# Patient Record
Sex: Female | Born: 1970 | Race: White | Smoking: Former smoker
Health system: Southern US, Community
[De-identification: ages and names within clinical notes are randomized; demographics above are authoritative.]

## PROBLEM LIST (undated history)

## (undated) DIAGNOSIS — Z889 Allergy status to unspecified drugs, medicaments and biological substances status: Secondary | ICD-10-CM

## (undated) DIAGNOSIS — F1211 Cannabis abuse, in remission: Secondary | ICD-10-CM

## (undated) DIAGNOSIS — N200 Calculus of kidney: Secondary | ICD-10-CM

## (undated) DIAGNOSIS — R9431 Abnormal electrocardiogram [ECG] [EKG]: Secondary | ICD-10-CM

## (undated) DIAGNOSIS — M79641 Pain in right hand: Secondary | ICD-10-CM

## (undated) DIAGNOSIS — R001 Bradycardia, unspecified: Secondary | ICD-10-CM

## (undated) DIAGNOSIS — Z8659 Personal history of other mental and behavioral disorders: Secondary | ICD-10-CM

## (undated) DIAGNOSIS — R0609 Other forms of dyspnea: Secondary | ICD-10-CM

## (undated) DIAGNOSIS — K580 Irritable bowel syndrome with diarrhea: Secondary | ICD-10-CM

## (undated) DIAGNOSIS — J439 Emphysema, unspecified: Secondary | ICD-10-CM

## (undated) DIAGNOSIS — F32A Depression, unspecified: Secondary | ICD-10-CM

## (undated) DIAGNOSIS — E785 Hyperlipidemia, unspecified: Secondary | ICD-10-CM

## (undated) DIAGNOSIS — I700 Atherosclerosis of aorta: Secondary | ICD-10-CM

## (undated) DIAGNOSIS — R32 Unspecified urinary incontinence: Secondary | ICD-10-CM

## (undated) DIAGNOSIS — F419 Anxiety disorder, unspecified: Secondary | ICD-10-CM

## (undated) DIAGNOSIS — M2550 Pain in unspecified joint: Secondary | ICD-10-CM

## (undated) DIAGNOSIS — F1011 Alcohol abuse, in remission: Secondary | ICD-10-CM

## (undated) DIAGNOSIS — R102 Pelvic and perineal pain: Secondary | ICD-10-CM

## (undated) DIAGNOSIS — G4700 Insomnia, unspecified: Secondary | ICD-10-CM

## (undated) DIAGNOSIS — I10 Essential (primary) hypertension: Secondary | ICD-10-CM

## (undated) DIAGNOSIS — N839 Noninflammatory disorder of ovary, fallopian tube and broad ligament, unspecified: Secondary | ICD-10-CM

## (undated) DIAGNOSIS — I4510 Unspecified right bundle-branch block: Secondary | ICD-10-CM

## (undated) DIAGNOSIS — T50905A Adverse effect of unspecified drugs, medicaments and biological substances, initial encounter: Secondary | ICD-10-CM

## (undated) DIAGNOSIS — N751 Abscess of Bartholin's gland: Secondary | ICD-10-CM

## (undated) DIAGNOSIS — F4310 Post-traumatic stress disorder, unspecified: Secondary | ICD-10-CM

## (undated) DIAGNOSIS — R768 Other specified abnormal immunological findings in serum: Secondary | ICD-10-CM

## (undated) DIAGNOSIS — G8929 Other chronic pain: Secondary | ICD-10-CM

## (undated) DIAGNOSIS — R7689 Other specified abnormal immunological findings in serum: Secondary | ICD-10-CM

## (undated) DIAGNOSIS — D649 Anemia, unspecified: Secondary | ICD-10-CM

## (undated) DIAGNOSIS — M1711 Unilateral primary osteoarthritis, right knee: Secondary | ICD-10-CM

## (undated) DIAGNOSIS — M79642 Pain in left hand: Secondary | ICD-10-CM

## (undated) DIAGNOSIS — E119 Type 2 diabetes mellitus without complications: Secondary | ICD-10-CM

## (undated) DIAGNOSIS — F1290 Cannabis use, unspecified, uncomplicated: Secondary | ICD-10-CM

## (undated) DIAGNOSIS — R635 Abnormal weight gain: Secondary | ICD-10-CM

## (undated) HISTORY — DX: Emphysema, unspecified: J43.9

## (undated) HISTORY — PX: CHOLECYSTECTOMY: SHX55

## (undated) HISTORY — PX: TUBAL LIGATION: SHX77

## (undated) HISTORY — DX: Abnormal electrocardiogram (ECG) (EKG): R94.31

---

## 1993-05-04 HISTORY — PX: TUBAL LIGATION: SHX77

## 1994-05-04 HISTORY — PX: CHOLECYSTECTOMY: SHX55

## 2005-05-18 ENCOUNTER — Ambulatory Visit: Payer: Self-pay | Admitting: Family Medicine

## 2006-01-01 ENCOUNTER — Ambulatory Visit: Payer: Self-pay | Admitting: *Deleted

## 2006-01-08 ENCOUNTER — Ambulatory Visit: Payer: Self-pay | Admitting: *Deleted

## 2006-01-22 ENCOUNTER — Ambulatory Visit: Payer: Self-pay | Admitting: *Deleted

## 2006-01-29 ENCOUNTER — Ambulatory Visit: Payer: Self-pay | Admitting: *Deleted

## 2006-03-19 ENCOUNTER — Ambulatory Visit: Payer: Self-pay | Admitting: *Deleted

## 2006-03-26 ENCOUNTER — Ambulatory Visit: Payer: Self-pay | Admitting: *Deleted

## 2006-04-07 ENCOUNTER — Ambulatory Visit: Payer: Self-pay | Admitting: *Deleted

## 2006-04-14 ENCOUNTER — Ambulatory Visit: Payer: Self-pay | Admitting: *Deleted

## 2006-05-07 ENCOUNTER — Ambulatory Visit: Payer: Self-pay | Admitting: *Deleted

## 2006-05-12 ENCOUNTER — Ambulatory Visit: Payer: Self-pay | Admitting: *Deleted

## 2006-05-21 ENCOUNTER — Ambulatory Visit: Payer: Self-pay | Admitting: *Deleted

## 2006-06-11 ENCOUNTER — Ambulatory Visit: Payer: Self-pay | Admitting: *Deleted

## 2006-06-16 ENCOUNTER — Ambulatory Visit: Payer: Self-pay | Admitting: *Deleted

## 2006-06-25 ENCOUNTER — Ambulatory Visit: Payer: Self-pay | Admitting: *Deleted

## 2006-07-02 ENCOUNTER — Ambulatory Visit: Payer: Self-pay | Admitting: *Deleted

## 2006-07-16 ENCOUNTER — Ambulatory Visit: Payer: Self-pay | Admitting: *Deleted

## 2010-01-26 ENCOUNTER — Emergency Department: Payer: Self-pay | Admitting: Unknown Physician Specialty

## 2018-12-29 DIAGNOSIS — Z8249 Family history of ischemic heart disease and other diseases of the circulatory system: Secondary | ICD-10-CM | POA: Insufficient documentation

## 2018-12-29 DIAGNOSIS — E785 Hyperlipidemia, unspecified: Secondary | ICD-10-CM | POA: Insufficient documentation

## 2018-12-29 DIAGNOSIS — Z720 Tobacco use: Secondary | ICD-10-CM | POA: Insufficient documentation

## 2018-12-29 DIAGNOSIS — I10 Essential (primary) hypertension: Secondary | ICD-10-CM | POA: Insufficient documentation

## 2018-12-29 DIAGNOSIS — R072 Precordial pain: Secondary | ICD-10-CM | POA: Insufficient documentation

## 2020-05-09 ENCOUNTER — Telehealth: Payer: Self-pay | Admitting: *Deleted

## 2020-05-09 ENCOUNTER — Encounter: Payer: Self-pay | Admitting: *Deleted

## 2020-05-09 DIAGNOSIS — Z122 Encounter for screening for malignant neoplasm of respiratory organs: Secondary | ICD-10-CM

## 2020-05-09 DIAGNOSIS — Z87891 Personal history of nicotine dependence: Secondary | ICD-10-CM

## 2020-05-09 NOTE — Telephone Encounter (Signed)
Received referral for initial lung cancer screening scan. Contacted patient and obtained smoking history,(current, 55.5 pack year) as well as answering questions related to screening process. Patient denies signs of lung cancer such as weight loss or hemoptysis. Patient denies comorbidity that would prevent curative treatment if lung cancer were found. Patient is scheduled for shared decision making visit and CT scan on 06/05/20 at 1030am.

## 2020-06-05 ENCOUNTER — Ambulatory Visit
Admission: RE | Admit: 2020-06-05 | Discharge: 2020-06-05 | Disposition: A | Discharge status: Home / Self Care | Payer: 59 | Source: Ambulatory Visit | Attending: Oncology | Admitting: Oncology

## 2020-06-05 ENCOUNTER — Other Ambulatory Visit: Payer: Self-pay

## 2020-06-05 ENCOUNTER — Inpatient Hospital Stay: Payer: 59 | Attending: Oncology | Admitting: Oncology

## 2020-06-05 DIAGNOSIS — Z122 Encounter for screening for malignant neoplasm of respiratory organs: Secondary | ICD-10-CM | POA: Insufficient documentation

## 2020-06-05 DIAGNOSIS — Z87891 Personal history of nicotine dependence: Secondary | ICD-10-CM | POA: Insufficient documentation

## 2020-06-05 NOTE — Progress Notes (Signed)
Virtual Visit via Video Note  I connected with Sandra Montes on 06/05/20 at 10:30 AM EST by a video enabled telemedicine application and verified that I am speaking with the correct person using two identifiers.  Location: Patient: [facility] Provider: Clinic    I discussed the limitations of evaluation and management by telemedicine and the availability of in person appointments. The patient expressed understanding and agreed to proceed.  I discussed the assessment and treatment plan with the patient. The patient was provided an opportunity to ask questions and all were answered. The patient agreed with the plan and demonstrated an understanding of the instructions.   The patient was advised to call back or seek an in-person evaluation if the symptoms worsen or if the condition fails to improve as anticipated.   In accordance with CMS guidelines, patient has met eligibility criteria including age, absence of signs or symptoms of lung cancer.  Social History   Tobacco Use  . Smoking status: Current Every Day Smoker    Packs/day: 1.50    Years: 37.00    Pack years: 55.50    Types: Cigarettes      A shared decision-making session was conducted prior to the performance of CT scan. This includes one or more decision aids, includes benefits and harms of screening, follow-up diagnostic testing, over-diagnosis, false positive rate, and total radiation exposure.   Counseling on the importance of adherence to annual lung cancer LDCT screening, impact of co-morbidities, and ability or willingness to undergo diagnosis and treatment is imperative for compliance of the program.   Counseling on the importance of continued smoking cessation for former smokers; the importance of smoking cessation for current smokers, and information about tobacco cessation interventions have been given to patient including [place] and [facility] quit [place] programs.   Written order for lung cancer screening with LDCT  has been given to the patient and any and all questions have been answered to the best of my abilities.    Yearly follow up will be coordinated by Burgess Estelle, Thoracic Navigator.  I provided 15 minutes of face-to-face video visit time during this encounter, and > 50% was spent counseling as documented under my assessment & plan.   Jacquelin Hawking, NP

## 2020-06-07 ENCOUNTER — Encounter: Payer: Self-pay | Admitting: *Deleted

## 2020-09-09 ENCOUNTER — Other Ambulatory Visit (HOSPITAL_COMMUNITY): Payer: Self-pay

## 2020-12-13 DIAGNOSIS — Z8616 Personal history of COVID-19: Secondary | ICD-10-CM

## 2020-12-13 HISTORY — DX: Personal history of COVID-19: Z86.16

## 2021-05-13 ENCOUNTER — Ambulatory Visit: Payer: 59 | Admitting: Podiatry

## 2021-05-13 ENCOUNTER — Other Ambulatory Visit: Payer: Self-pay

## 2021-05-13 DIAGNOSIS — M722 Plantar fascial fibromatosis: Secondary | ICD-10-CM

## 2021-05-13 DIAGNOSIS — M7671 Peroneal tendinitis, right leg: Secondary | ICD-10-CM

## 2021-05-15 NOTE — Progress Notes (Signed)
Subjective:  Patient ID: Sandra Montes, female    DOB: 1971/01/07,  MRN: [id number]  Chief Complaint  Patient presents with   Foot Pain    Right foot pain 8-9 months pain is worse after sitting for a long time  Has a constant ache when not standing pain is at a 2 but when she stands pain goes to a 57     51 y.o. female presents with the above complaint.  Patient presents with complaint of right lateral foot as well as slight right heel pain for past 8 to 9 months has progressively gotten worse.  Is constant ache when not standing pain is active but when she stands pain is at 9.  She states progressive gotten worse.  She has brought copies of her x-rays that was done at an outside facility with her.  She states that hurts with ambulation and is progressively gets worse.  Her pain is dull achy in nature.  She would like to discuss treatment options for this.  She has not seen a foot and ankle specialist.   Review of Systems: Negative except as noted in the HPI. Denies N/V/F/Ch.  No past medical history on file.  Current Outpatient Medications:    albuterol (VENTOLIN HFA) 108 (90 Base) MCG/ACT inhaler, Inhale into the lungs., Disp: , Rfl:    chlorpheniramine-HYDROcodone (TUSSIONEX) 10-8 MG/5ML SUER, Take by mouth., Disp: , Rfl:    clindamycin (CLINDAGEL) 1 % gel, Apply topically., Disp: , Rfl:    lidocaine-prilocaine (EMLA) cream, Apply topically., Disp: , Rfl:    ALPRAZolam (XANAX) 1 MG tablet, Take by mouth., Disp: , Rfl:    atorvastatin (LIPITOR) 40 MG tablet, SMARTSIG:1 Tablet(s) By Mouth Every Evening, Disp: , Rfl:    B Complex Vitamins (VITAMIN B-COMPLEX) TABS, Take 1 tablet by mouth daily., Disp: , Rfl:    benzonatate (TESSALON) 200 MG capsule, Take by mouth., Disp: , Rfl:    buPROPion (WELLBUTRIN XL) 300 MG 24 hr tablet, Take by mouth., Disp: , Rfl:    cetirizine (ZYRTEC) 10 MG tablet, Take by mouth., Disp: , Rfl:    Cinnamon 500 MG capsule, Take by mouth., Disp: , Rfl:     desvenlafaxine (PRISTIQ) 50 MG 24 hr tablet, Take by mouth., Disp: , Rfl:    doxycycline (VIBRAMYCIN) 100 MG capsule, Take 100 mg by mouth 2 (two) times daily., Disp: , Rfl:    lidocaine-prilocaine (EMLA) cream, Apply topically as needed., Disp: , Rfl:    melatonin 1 MG TABS tablet, Take by mouth., Disp: , Rfl:    metoprolol succinate (TOPROL-XL) 50 MG 24 hr tablet, Take 50 mg by mouth daily., Disp: , Rfl:    Omega-3 1000 MG CAPS, Take by mouth., Disp: , Rfl:    predniSONE (STERAPRED UNI-PAK 21 TAB) 10 MG (21) TBPK tablet, Take by mouth., Disp: , Rfl:    valsartan-hydrochlorothiazide (DIOVAN-HCT) 320-25 MG tablet, Take 1 tablet by mouth daily., Disp: , Rfl:   Social History   Tobacco Use  Smoking Status Every Day   Packs/day: 1.50   Years: 37.00   Pack years: 55.50   Types: Cigarettes  Smokeless Tobacco Not on file    Allergies  Allergen Reactions   Lisinopril     Other reaction(s): Other (See Comments)   Morphine Other (See Comments)    "tried to take her face off due to itching" Other reaction(s): Other (See Comments) "tried to take her face off due to itching"    Sulfa Antibiotics  Other reaction(s): Other (See Comments) Pass out Other reaction(s): Other (See Comments) Pass out    Objective:  There were no vitals filed for this visit. There is no height or weight on file to calculate BMI. Constitutional Well developed. Well nourished.  Vascular Dorsalis pedis pulses palpable bilaterally. Posterior tibial pulses palpable bilaterally. Capillary refill normal to all digits.  No cyanosis or clubbing noted. Pedal hair growth normal.  Neurologic Normal speech. Oriented to person, place, and time. Epicritic sensation to light touch grossly present bilaterally.  Dermatologic Nails well groomed and normal in appearance. No open wounds. No skin lesions.  Orthopedic: Pain on palpation along the course of the peroneal tendon including the insertion.  Pain at the  calcaneal tuber.  Pain with resisted dorsiflexion eversion of the foot.  No pain with plantarflexion inversion of the foot.  Positive Silfverskiold test still with ankle equinus.   Radiographs: 3 views of skeletally mature adult right foot: Plantar and posterior heel spurring noted.  Pes planovalgus foot structure noted.  No bony abnormalities noted.  No stress fracture noted. Assessment:   1. Peroneal tendinitis, right   2. Plantar fasciitis, right    Plan:  Patient was evaluated and treated and all questions answered.  Right peroneal tendinitis/Planter fasciitis -All questions or concerns were discussed with the patient in extensive detail -Clinically at this time given that she is hurting primarily on the lateral side of the foot as well as the plantar side of the foot I believe she will benefit from cam boot immobilization to take the inflammation down.  Once she has improved from this we can discuss steroid injection at that time.  Patient states understanding would like to proceed with cam boot immobilization -Cam boot was dispensed  No follow-ups on file.

## 2021-06-05 ENCOUNTER — Ambulatory Visit: Payer: 59 | Admitting: Podiatry

## 2021-06-24 ENCOUNTER — Ambulatory Visit: Payer: 59 | Admitting: Podiatry

## 2021-06-24 ENCOUNTER — Other Ambulatory Visit: Payer: Self-pay

## 2021-06-24 DIAGNOSIS — M7671 Peroneal tendinitis, right leg: Secondary | ICD-10-CM

## 2021-06-24 DIAGNOSIS — M722 Plantar fascial fibromatosis: Secondary | ICD-10-CM

## 2021-06-26 ENCOUNTER — Encounter: Payer: Self-pay | Admitting: Podiatry

## 2021-06-26 NOTE — Progress Notes (Signed)
Subjective:  Patient ID: Sandra Montes, female    DOB: [birth year],  MRN: [id number]  Chief Complaint  Patient presents with   Foot Pain    Pt stated that she still has some issues with her right foot     51 y.o. female presents with the above complaint.  Patient presents for follow-up of right peroneal tendinitis as well as right plantar fasciitis.  She states she is feeling better.  The cam boot immobilization did allow the pain to be decreased.  She would like to discuss next treatment plan.  She would like to know if she can do injections at this time.   Review of Systems: Negative except as noted in the HPI. Denies N/V/F/Ch.  No past medical history on file.  Current Outpatient Medications:    albuterol (VENTOLIN HFA) 108 (90 Base) MCG/ACT inhaler, Inhale into the lungs., Disp: , Rfl:    ALPRAZolam (XANAX) 1 MG tablet, Take by mouth., Disp: , Rfl:    atorvastatin (LIPITOR) 40 MG tablet, SMARTSIG:1 Tablet(s) By Mouth Every Evening, Disp: , Rfl:    B Complex Vitamins (VITAMIN B-COMPLEX) TABS, Take 1 tablet by mouth daily., Disp: , Rfl:    benzonatate (TESSALON) 200 MG capsule, Take by mouth., Disp: , Rfl:    buPROPion (WELLBUTRIN XL) 300 MG 24 hr tablet, Take by mouth., Disp: , Rfl:    cetirizine (ZYRTEC) 10 MG tablet, Take by mouth., Disp: , Rfl:    chlorpheniramine-HYDROcodone (TUSSIONEX) 10-8 MG/5ML SUER, Take by mouth., Disp: , Rfl:    Cinnamon 500 MG capsule, Take by mouth., Disp: , Rfl:    clindamycin (CLINDAGEL) 1 % gel, Apply topically., Disp: , Rfl:    desvenlafaxine (PRISTIQ) 50 MG 24 hr tablet, Take by mouth., Disp: , Rfl:    doxycycline (VIBRAMYCIN) 100 MG capsule, Take 100 mg by mouth 2 (two) times daily., Disp: , Rfl:    lidocaine-prilocaine (EMLA) cream, Apply topically as needed., Disp: , Rfl:    lidocaine-prilocaine (EMLA) cream, Apply topically., Disp: , Rfl:    melatonin 1 MG TABS tablet, Take by mouth., Disp: , Rfl:    metoprolol succinate (TOPROL-XL) 50  MG 24 hr tablet, Take 50 mg by mouth daily., Disp: , Rfl:    Omega-3 1000 MG CAPS, Take by mouth., Disp: , Rfl:    predniSONE (STERAPRED UNI-PAK 21 TAB) 10 MG (21) TBPK tablet, Take by mouth., Disp: , Rfl:    valsartan-hydrochlorothiazide (DIOVAN-HCT) 320-25 MG tablet, Take 1 tablet by mouth daily., Disp: , Rfl:   Social History   Tobacco Use  Smoking Status Every Day   Packs/day: 1.50   Years: 37.00   Pack years: 55.50   Types: Cigarettes  Smokeless Tobacco Not on file    Allergies  Allergen Reactions   Lisinopril     Other reaction(s): Other (See Comments)   Morphine Other (See Comments)    "tried to take her face off due to itching" Other reaction(s): Other (See Comments) "tried to take her face off due to itching"    Sulfa Antibiotics     Other reaction(s): Other (See Comments) Pass out Other reaction(s): Other (See Comments) Pass out    Objective:  There were no vitals filed for this visit. There is no height or weight on file to calculate BMI. Constitutional Well developed. Well nourished.  Vascular Dorsalis pedis pulses palpable bilaterally. Posterior tibial pulses palpable bilaterally. Capillary refill normal to all digits.  No cyanosis or clubbing noted. Pedal hair growth normal.  Neurologic  Normal speech. Oriented to person, place, and time. Epicritic sensation to light touch grossly present bilaterally.  Dermatologic Nails well groomed and normal in appearance. No open wounds. No skin lesions.  Orthopedic: Pain on palpation along the course of the peroneal tendon including the insertion.  Pain at the calcaneal tuber.  Pain with resisted dorsiflexion eversion of the foot.  No pain with plantarflexion inversion of the foot.  Positive Silfverskiold test still with ankle equinus.   Radiographs: 3 views of skeletally mature adult right foot: Plantar and posterior heel spurring noted.  Pes planovalgus foot structure noted.  No bony abnormalities noted.  No  stress fracture noted. Assessment:   1. Plantar fasciitis, right   2. Peroneal tendinitis, right     Plan:  Patient was evaluated and treated and all questions answered.  Right peroneal tendinitis/Planter fasciitis -All questions or concerns were discussed with the patient in extensive detail -Clinically cam boot immobilization to allow the inflammation to be decreased.  At this time she still has some residual inflammation I believe patient will benefit from a steroid injection both the peroneal tendon as well as the plantar fasciitis.  Possibly she will benefit from Tri-Lock ankle brace to give stability when ambulating.  This will also help her transition out of the boot into regular shoe -A steroid injection was performed at right lateral foot as well as right plantar heel using 1% plain Lidocaine and 10 mg of Kenalog. This was well tolerated.   No follow-ups on file.

## 2021-07-04 ENCOUNTER — Encounter: Payer: Self-pay | Admitting: Family

## 2021-07-04 ENCOUNTER — Ambulatory Visit: Payer: 59 | Admitting: Family

## 2021-07-04 ENCOUNTER — Other Ambulatory Visit: Payer: Self-pay

## 2021-07-04 VITALS — BP 124/80 | HR 86 | Ht 66.5 in | Wt 205.0 lb

## 2021-07-04 DIAGNOSIS — E782 Mixed hyperlipidemia: Secondary | ICD-10-CM | POA: Insufficient documentation

## 2021-07-04 DIAGNOSIS — J301 Allergic rhinitis due to pollen: Secondary | ICD-10-CM | POA: Diagnosis not present

## 2021-07-04 DIAGNOSIS — Z720 Tobacco use: Secondary | ICD-10-CM

## 2021-07-04 DIAGNOSIS — E785 Hyperlipidemia, unspecified: Secondary | ICD-10-CM | POA: Diagnosis not present

## 2021-07-04 DIAGNOSIS — R739 Hyperglycemia, unspecified: Secondary | ICD-10-CM | POA: Insufficient documentation

## 2021-07-04 DIAGNOSIS — J432 Centrilobular emphysema: Secondary | ICD-10-CM | POA: Diagnosis not present

## 2021-07-04 DIAGNOSIS — I10 Essential (primary) hypertension: Secondary | ICD-10-CM | POA: Diagnosis not present

## 2021-07-04 MED ORDER — METOPROLOL SUCCINATE ER 50 MG PO TB24: ORAL_TABLET | ORAL | 1 refills | Status: DC

## 2021-07-04 MED ORDER — FLUTICASONE PROPIONATE 50 MCG/ACT NA SUSP: 2.0000 | Freq: Every day | NASAL | 6 refills | Status: DC

## 2021-07-04 MED ORDER — METOPROLOL SUCCINATE ER 25 MG PO TB24: ORAL_TABLET | ORAL | 1 refills | Status: DC

## 2021-07-04 MED ORDER — VALSARTAN-HYDROCHLOROTHIAZIDE 320-25 MG PO TABS: 1.0000 | ORAL_TABLET | Freq: Every day | ORAL | 1 refills | Status: DC

## 2021-07-04 MED ORDER — ATORVASTATIN CALCIUM 40 MG PO TABS: ORAL_TABLET | ORAL | 1 refills | Status: DC

## 2021-07-04 MED ORDER — SPIRIVA HANDIHALER 18 MCG IN CAPS: ORAL_CAPSULE | RESPIRATORY_TRACT | 2 refills | Status: DC

## 2021-07-04 NOTE — Progress Notes (Signed)
 New Patient Office Visit  Subjective:  Patient ID: Sandra Montes, female    DOB: [birth year]  Age: 51 y.o. MRN: [id number]  CC:  Chief Complaint  Patient presents with   Establish Care    Pt establish    HPI Sandra Montes is here to establish care as a new patient.  Prior provider was: back in 2019, used to see [facility].  Pt is without acute concerns.   chronic concerns:  Keratin ball on inner labia, sometimes gets irritated apply emla cream which helps.   CT low dose 06/05/20: chronically gets bronchitis, last episode over one year ago thankfully, emphysema seen on exam. Albuterol inhaler prn, does notice wheezing often Did show benign appearing pulmonary nodules per radiologist, bil calcified and noncalcified pulmonary nodules identified. Incidentally 2.4 cm exophytic lesion upper pole left kidney measures water density, compatible with a cyst. 6 mm hypodensity upper pole right kidney is too small to characterize but likely benign.does note DOE and back breathes when this is happening. Does note increasing sob at times as well. Still smoking.   Smoking: wellbutrin made her happy smoker, chantix made her crazy. Hypnotherapy currently not really helping.   Does note some right ear fullness/ has had cerumen impaction some time ago. Feels like not getting all of the fluid out.    History reviewed. No pertinent past medical history.  Past Surgical History:  Procedure Laterality Date   CESAREAN SECTION     1993   CHOLECYSTECTOMY     1996   TUBAL LIGATION     1995    Family History  Problem Relation Age of Onset   Alcohol abuse Mother    Heart attack Mother 40   Alcohol abuse Father    Skin cancer Paternal Grandfather    Asthma Daughter     Social History   Socioeconomic History   Marital status: Divorced    Spouse name: Not on file   Number of children: 3   Years of education: Not on file   Highest education level: Not on file  Occupational  History   Not on file  Tobacco Use   Smoking status: Every Day    Packs/day: 1.50    Years: 37.00    Pack years: 55.50    Types: Cigarettes   Smokeless tobacco: Not on file  Vaping Use   Vaping Use: Never used  Substance and Sexual Activity   Alcohol use: Not Currently    Comment: h/o alcoholism quit 2017   Drug use: Yes    Types: Marijuana   Sexual activity: Not Currently    Partners: Male  Other Topics Concern   Not on file  Social History Narrative   Not on file   Social Determinants of Health   Financial Resource Strain: Not on file  Food Insecurity: Not on file  Transportation Needs: Not on file  Physical Activity: Not on file  Stress: Not on file  Social Connections: Not on file  Intimate Partner Violence: Not on file    Outpatient Medications Prior to Visit  Medication Sig Dispense Refill   albuterol (VENTOLIN HFA) 108 (90 Base) MCG/ACT inhaler Inhale into the lungs.     B Complex Vitamins (VITAMIN B-COMPLEX) TABS Take 1 tablet by mouth daily.     cetirizine (ZYRTEC) 10 MG tablet Take by mouth.     Cinnamon 500 MG capsule Take by mouth.     lidocaine-prilocaine (EMLA) cream Apply topically as needed.     lidocaine-prilocaine (  EMLA) cream Apply topically.     melatonin 1 MG TABS tablet Take by mouth.     atorvastatin (LIPITOR) 40 MG tablet SMARTSIG:1 Tablet(s) By Mouth Every Evening     benzonatate (TESSALON) 200 MG capsule Take by mouth.     chlorpheniramine-HYDROcodone (TUSSIONEX) 10-8 MG/5ML SUER Take by mouth.     clindamycin (CLINDAGEL) 1 % gel Apply topically.     desvenlafaxine (PRISTIQ) 50 MG 24 hr tablet Take by mouth.     doxycycline (VIBRAMYCIN) 100 MG capsule Take 100 mg by mouth 2 (two) times daily.     metoprolol succinate (TOPROL-XL) 50 MG 24 hr tablet Take 50 mg by mouth daily.     predniSONE (STERAPRED UNI-PAK 21 TAB) 10 MG (21) TBPK tablet Take by mouth.     valsartan-hydrochlorothiazide (DIOVAN-HCT) 320-25 MG tablet Take 1 tablet by mouth  daily.     ALPRAZolam (XANAX) 1 MG tablet Take by mouth.     buPROPion (WELLBUTRIN XL) 300 MG 24 hr tablet Take by mouth.     Omega-3 1000 MG CAPS Take by mouth.     No facility-administered medications prior to visit.    Allergies  Allergen Reactions   Lisinopril Cough   Morphine Other (See Comments)    "tried to take her face off due to itching" Other reaction(s): Other (See Comments) "tried to take her face off due to itching"    Sulfa Antibiotics     Other reaction(s): Other (See Comments) Pass out Other reaction(s): Other (See Comments) Pass out     ROS Review of Systems  Constitutional:  Negative for chills, fatigue, fever and unexpected weight change.  HENT:  Positive for ear pain (right ear fullness, no pain), postnasal drip and tinnitus (chronic many years per pt). Negative for sinus pressure, sinus pain and trouble swallowing.   Eyes:  Negative for visual disturbance.  Respiratory:  Positive for chest tightness (sometimes hard to take deep breaths), shortness of breath (doe) and wheezing (intermittently).   Cardiovascular:  Negative for chest pain.  Gastrointestinal:  Negative for abdominal pain.  Genitourinary:  Negative for difficulty urinating.  Skin:  Negative for rash.  Neurological:  Negative for dizziness and headaches.  Psychiatric/Behavioral:  Negative for suicidal ideas. The patient is not nervous/anxious.    Review of Systems  Respiratory:  Negative for shortness of breath.   Cardiovascular:  Negative for chest pain and palpitations.  Gastrointestinal:  Negative for constipation and diarrhea.  Genitourinary:  Negative for dysuria, frequency and urgency.  Musculoskeletal:  Negative for myalgias.  Psychiatric/Behavioral:  Negative for depression and suicidal ideas.   All other systems reviewed and are negative.    Objective:    Physical Exam Vitals reviewed.  Constitutional:      General: She is not in acute distress.    Appearance: Normal  appearance. She is obese. She is not ill-appearing, toxic-appearing or diaphoretic.  HENT:     Right Ear: Tympanic membrane normal.     Left Ear: Tympanic membrane normal.     Mouth/Throat:     Mouth: Mucous membranes are moist.     Pharynx: Posterior oropharyngeal erythema (cobblestoning of throat) present. No pharyngeal swelling.     Tonsils: No tonsillar exudate.  Eyes:     Extraocular Movements: Extraocular movements intact.     Conjunctiva/sclera: Conjunctivae normal.     Pupils: Pupils are equal, round, and reactive to light.  Neck:     Thyroid: No thyroid mass.  Cardiovascular:  Rate and Rhythm: Normal rate and regular rhythm.  Pulmonary:     Effort: Pulmonary effort is normal.     Breath sounds: Normal breath sounds.  Abdominal:     General: Abdomen is flat. Bowel sounds are normal.     Palpations: Abdomen is soft.  Musculoskeletal:        General: Normal range of motion.  Lymphadenopathy:     Cervical:     Right cervical: No superficial cervical adenopathy.    Left cervical: No superficial cervical adenopathy.  Skin:    General: Skin is warm.     Capillary Refill: Capillary refill takes less than 2 seconds.  Neurological:     General: No focal deficit present.     Mental Status: She is alert and oriented to person, place, and time.  Psychiatric:        Mood and Affect: Mood normal.        Behavior: Behavior normal.        Thought Content: Thought content normal.        Judgment: Judgment normal.    Gen: NAD, resting comfortably CV: RRR with no murmurs appreciated Pulm: NWOB, CTAB with no crackles, wheezes, or rhonchi Skin: warm, dry Psych: Normal affect and thought content  BP 124/80   Pulse 86   Ht 5' 6.5" (1.689 m)   Wt 205 lb (93 kg)   LMP 06/12/2021   SpO2 99%   BMI 32.59 kg/m  Wt Readings from Last 3 Encounters:  07/04/21 205 lb (93 kg)  06/05/20 221 lb (100.2 kg)     Health Maintenance Due  Topic Date Due   COVID-19 Vaccine (1) Never  done   HIV Screening  Never done   Hepatitis C Screening  Never done   TETANUS/TDAP  Never done   PAP SMEAR-Modifier  Never done   COLONOSCOPY (Pts 45-32yrs Insurance coverage will need to be confirmed)  Never done   MAMMOGRAM  Never done   Zoster Vaccines- Shingrix (1 of 2) Never done   INFLUENZA VACCINE  Never done    There are no preventive care reminders to display for this patient.  No results found for: TSH No results found for: WBC, HGB, HCT, MCV, PLT No results found for: NA, K, CHLORIDE, CO2, GLUCOSE, BUN, CREATININE, BILITOT, ALKPHOS, AST, ALT, PROT, ALBUMIN, CALCIUM, ANIONGAP, EGFR, GFR No results found for: CHOL No results found for: HDL No results found for: LDLCALC No results found for: TRIG No results found for: CHOLHDL No results found for: ZOXW9U    Assessment & Plan:   Problem List Items Addressed This Visit       Cardiovascular and Mediastinum   Essential hypertension    Refilled Diovan hydrochlorothiazide as well as metoprolol. Pt advised of the following:  Continue medication as prescribed. Monitor blood pressure periodically and/or when you feel symptomatic. Goal is <130/90 on average. Ensure that you have rested for 30 minutes prior to checking your blood pressure. Record your readings and bring them to your next visit if necessary.work on a low sodium diet.       Relevant Medications   atorvastatin (LIPITOR) 40 MG tablet   valsartan-hydrochlorothiazide (DIOVAN-HCT) 320-25 MG tablet   metoprolol succinate (TOPROL-XL) 50 MG 24 hr tablet   metoprolol succinate (TOPROL-XL) 25 MG 24 hr tablet   Other Relevant Orders   CBC   Comprehensive metabolic panel     Respiratory   Centrilobular emphysema (HCC)    Reviewed CT low-dose scan from 2022  Refer patient to pulmonary for evaluation and treat as emphysema new diagnosis Also is symptomatic with dyspnea on exertion and slight intermittent shortness of breath.  Trial of  Spiriva for patient to see if  improvement in symptoms Encourage smoking cessation.  Patient is currently trying to do this with hypnotherapy      Relevant Medications   tiotropium (SPIRIVA HANDIHALER) 18 MCG inhalation capsule   fluticasone (FLONASE) 50 MCG/ACT nasal spray   Other Relevant Orders   Ambulatory referral to Pulmonology   Non-seasonal allergic rhinitis due to pollen    Patient taking Zyrtec daily I will also start patient on daily Flonase nasal spray.  See if any improvement especially in the fluid feeling in the ears if not we will refer to ENT.  I have referred patient to the allergist as well to potentially restart allergy shots that she was taking them with success in [place]       Relevant Medications   fluticasone (FLONASE) 50 MCG/ACT nasal spray   Other Relevant Orders   Ambulatory referral to Allergy     Other   Dyslipidemia - Primary    Refill sent for atorvastatin patient to work on low-cholesterol diet and exercise as tolerated      Relevant Medications   atorvastatin (LIPITOR) 40 MG tablet   Other Relevant Orders   Lipid panel   Tobacco abuse    Patient does have a desire to quit it is just more difficult than anticipated she is currently undergoing hypnotherapy.      Hyperglycemia    A1c ordered today work on diabetic diet and exercise as tolerated      Relevant Orders   Hemoglobin A1c    Meds ordered this encounter  Medications   atorvastatin (LIPITOR) 40 MG tablet    Sig: SMARTSIG:1 Tablet(s) By Mouth Every Evening    Dispense:  90 tablet    Refill:  1    Order Specific Question:   Supervising Provider    Answer:   BEDSOLE, AMY E [[id number]]   valsartan-hydrochlorothiazide (DIOVAN-HCT) 320-25 MG tablet    Sig: Take 1 tablet by mouth daily.    Dispense:  90 tablet    Refill:  1    Order Specific Question:   Supervising Provider    Answer:   BEDSOLE, AMY E [[id number]]   metoprolol succinate (TOPROL-XL) 50 MG 24 hr tablet    Sig: Take one po qd along with 25 mg ER tablet for  total of 75 mg daily    Dispense:  90 tablet    Refill:  1    Order Specific Question:   Supervising Provider    Answer:   BEDSOLE, AMY E [[id number]]   metoprolol succinate (TOPROL-XL) 25 MG 24 hr tablet    Sig: Take one po qd along with 50 mg ER tablet for total of 75 mg daily    Dispense:  90 tablet    Refill:  1    Order Specific Question:   Supervising Provider    Answer:   BEDSOLE, AMY E [[id number]]   tiotropium (SPIRIVA HANDIHALER) 18 MCG inhalation capsule    Sig: Inhale 2 puffs ( 1 cap) inhaled qd    Dispense:  30 capsule    Refill:  2    Order Specific Question:   Supervising Provider    Answer:   BEDSOLE, AMY E [[id number]]   fluticasone (FLONASE) 50 MCG/ACT nasal spray    Sig: Place 2 sprays into both nostrils daily.  Dispense:  16 g    Refill:  6    Order Specific Question:   Supervising Provider    Answer:   Cherlyn Cornet, AMY E [[id number]]    Follow-up: Return in about 3 months (around 10/04/2021) for physical exam, CPE and follow up on medication.    Felicita Horns, FNP

## 2021-07-04 NOTE — Patient Instructions (Addendum)
Trial inhaler for your COPD sent to pharmacy.  ?A referral was placed today for pulmonologist.  ?Please let us know if you have not heard back within 1 week about your referral. ? ?.td ? ? ? ? ? ? ? ?

## 2021-07-04 NOTE — Assessment & Plan Note (Signed)
Patient does have a desire to quit it is just more difficult than anticipated she is currently undergoing hypnotherapy. ?

## 2021-07-04 NOTE — Assessment & Plan Note (Signed)
Refilled Diovan hydrochlorothiazide as well as metoprolol. ?Pt advised of the following:  ?Continue medication as prescribed. Monitor blood pressure periodically and/or when you feel symptomatic. Goal is <130/90 on average. Ensure that you have rested for 30 minutes prior to checking your blood pressure. Record your readings and bring them to your next visit if necessary.work on a low sodium diet. ? ?

## 2021-07-04 NOTE — Assessment & Plan Note (Signed)
Refill sent for atorvastatin patient to work on low-cholesterol diet and exercise as tolerated ?

## 2021-07-04 NOTE — Assessment & Plan Note (Signed)
Patient taking Zyrtec daily I will also start patient on daily Flonase nasal spray.  See if any improvement especially in the fluid feeling in the ears if not we will refer to ENT.  I have referred patient to the allergist as well to potentially restart allergy shots that she was taking them with success in [place] ?

## 2021-07-04 NOTE — Assessment & Plan Note (Signed)
Reviewed CT low-dose scan from 2022 ?Refer patient to pulmonary for evaluation and treat as emphysema new diagnosis ?Also is symptomatic with dyspnea on exertion and slight intermittent shortness of breath.  Trial of  Spiriva for patient to see if improvement in symptoms ?Encourage smoking cessation.  Patient is currently trying to do this with hypnotherapy ?

## 2021-07-04 NOTE — Assessment & Plan Note (Signed)
A1c ordered today work on diabetic diet and exercise as tolerated ?

## 2021-07-05 LAB — CBC
HCT: 49.8 % — ABNORMAL HIGH (ref 35.0–45.0)
Hemoglobin: 17.2 g/dL — ABNORMAL HIGH (ref 11.7–15.5)
MCH: 31.3 pg (ref 27.0–33.0)
MCHC: 34.5 g/dL (ref 32.0–36.0)
MCV: 90.7 fL (ref 80.0–100.0)
MPV: 9.1 fL (ref 7.5–12.5)
Platelets: 230 10*3/uL (ref 140–400)
RBC: 5.49 10*6/uL — ABNORMAL HIGH (ref 3.80–5.10)
RDW: 12.5 % (ref 11.0–15.0)
WBC: 9.4 10*3/uL (ref 3.8–10.8)

## 2021-07-05 LAB — COMPREHENSIVE METABOLIC PANEL WITH GFR
AG Ratio: 1.7 (calc) (ref 1.0–2.5)
ALT: 16 U/L (ref 6–29)
AST: 16 U/L (ref 10–35)
Albumin: 4.3 g/dL (ref 3.6–5.1)
Alkaline phosphatase (APISO): 66 U/L (ref 37–153)
BUN: 12 mg/dL (ref 7–25)
CO2: 31 mmol/L (ref 20–32)
Calcium: 9.2 mg/dL (ref 8.6–10.4)
Chloride: 98 mmol/L (ref 98–110)
Creat: 0.65 mg/dL (ref 0.50–1.03)
Globulin: 2.5 g/dL (ref 1.9–3.7)
Glucose, Bld: 96 mg/dL (ref 65–99)
Potassium: 4 mmol/L (ref 3.5–5.3)
Sodium: 139 mmol/L (ref 135–146)
Total Bilirubin: 0.6 mg/dL (ref 0.2–1.2)
Total Protein: 6.8 g/dL (ref 6.1–8.1)

## 2021-07-05 LAB — LIPID PANEL
Cholesterol: 191 mg/dL (ref <200)
HDL: 52 mg/dL (ref >=50)
LDL Cholesterol (Calc): 100 mg/dL — ABNORMAL HIGH
Non-HDL Cholesterol (Calc): 139 mg/dL — ABNORMAL HIGH (ref <130)
Total CHOL/HDL Ratio: 3.7 (calc) (ref <5.0)
Triglycerides: 272 mg/dL — ABNORMAL HIGH (ref <150)

## 2021-07-05 LAB — HEMOGLOBIN A1C
Hgb A1c MFr Bld: 5.5 %{Hb} (ref <5.7)
Mean Plasma Glucose: 111 mg/dL
eAG (mmol/L): 6.2 mmol/L

## 2021-07-22 ENCOUNTER — Other Ambulatory Visit: Payer: Self-pay

## 2021-07-22 ENCOUNTER — Ambulatory Visit: Payer: 59 | Admitting: Podiatry

## 2021-07-22 DIAGNOSIS — M899 Disorder of bone, unspecified: Secondary | ICD-10-CM | POA: Diagnosis not present

## 2021-07-22 DIAGNOSIS — M7671 Peroneal tendinitis, right leg: Secondary | ICD-10-CM

## 2021-07-22 DIAGNOSIS — M949 Disorder of cartilage, unspecified: Secondary | ICD-10-CM | POA: Diagnosis not present

## 2021-07-22 MED ORDER — CYCLOBENZAPRINE HCL 10 MG PO TABS
10.0000 mg | ORAL_TABLET | Freq: Three times a day (TID) | ORAL | 0 refills | Status: DC | PRN
Start: 2021-07-22 — End: 2022-09-07

## 2021-07-24 NOTE — Progress Notes (Signed)
?Subjective:  ?Patient ID: Sandra Montes, female    DOB: 02-Apr-1971,  MRN: [id number] ? ?Chief Complaint  ?Patient presents with  ? Foot Pain  ?  "It hurts."  ? ? ?51 y.o. female presents with the above complaint.  Patient presents for follow-up to right ankle and lateral foot pain.  She states her Planter fasciitis pain is completely gone.  The injection helped with.  Cam boot immobilization did not help with the ankle pain that she is experiencing.  She would like to discuss next treatment plan. ? ? ?Review of Systems: Negative except as noted in the HPI. Denies N/V/F/Ch. ? ?No past medical history on file. ? ?Current Outpatient Medications:  ?  cyclobenzaprine (FLEXERIL) 10 MG tablet, Take 1 tablet (10 mg total) by mouth 3 (three) times daily as needed for muscle spasms., Disp: 60 tablet, Rfl: 0 ?  albuterol (VENTOLIN HFA) 108 (90 Base) MCG/ACT inhaler, Inhale into the lungs., Disp: , Rfl:  ?  atorvastatin (LIPITOR) 40 MG tablet, SMARTSIG:1 Tablet(s) By Mouth Every Evening, Disp: 90 tablet, Rfl: 1 ?  B Complex Vitamins (VITAMIN B-COMPLEX) TABS, Take 1 tablet by mouth daily., Disp: , Rfl:  ?  cetirizine (ZYRTEC) 10 MG tablet, Take by mouth., Disp: , Rfl:  ?  Cinnamon 500 MG capsule, Take by mouth., Disp: , Rfl:  ?  fluticasone (FLONASE) 50 MCG/ACT nasal spray, Place 2 sprays into both nostrils daily., Disp: 16 g, Rfl: 6 ?  lidocaine-prilocaine (EMLA) cream, Apply topically as needed., Disp: , Rfl:  ?  lidocaine-prilocaine (EMLA) cream, Apply topically., Disp: , Rfl:  ?  melatonin 1 MG TABS tablet, Take by mouth., Disp: , Rfl:  ?  metoprolol succinate (TOPROL-XL) 25 MG 24 hr tablet, Take one po qd along with 50 mg ER tablet for total of 75 mg daily, Disp: 90 tablet, Rfl: 1 ?  metoprolol succinate (TOPROL-XL) 50 MG 24 hr tablet, Take one po qd along with 25 mg ER tablet for total of 75 mg daily, Disp: 90 tablet, Rfl: 1 ?  tiotropium (SPIRIVA HANDIHALER) 18 MCG inhalation capsule, Inhale 2 puffs ( 1 cap)  inhaled qd, Disp: 30 capsule, Rfl: 2 ?  valsartan-hydrochlorothiazide (DIOVAN-HCT) 320-25 MG tablet, Take 1 tablet by mouth daily., Disp: 90 tablet, Rfl: 1 ? ?Social History  ? ?Tobacco Use  ?Smoking Status Every Day  ? Packs/day: 1.50  ? Years: 37.00  ? Pack years: 55.50  ? Types: Cigarettes  ?Smokeless Tobacco Not on file  ? ? ?Allergies  ?Allergen Reactions  ? Lisinopril Cough  ? Morphine Other (See Comments)  ?  "tried to take her face off due to itching" ?Other reaction(s): Other (See Comments) ?"tried to take her face off due to itching" ?  ? Sulfa Antibiotics   ?  Other reaction(s): Other (See Comments) ?Pass out ?Other reaction(s): Other (See Comments) ?Pass out ?  ? ?Objective:  ?There were no vitals filed for this visit. ?There is no height or weight on file to calculate BMI. ?Constitutional Well developed. ?Well nourished.  ?Vascular Dorsalis pedis pulses palpable bilaterally. ?Posterior tibial pulses palpable bilaterally. ?Capillary refill normal to all digits.  ?No cyanosis or clubbing noted. ?Pedal hair growth normal.  ?Neurologic Normal speech. ?Oriented to person, place, and time. ?Epicritic sensation to light touch grossly present bilaterally.  ?Dermatologic Nails well groomed and normal in appearance. ?No open wounds. ?No skin lesions.  ?Orthopedic: Pain on palpation along the course of the peroneal tendon including the insertion.  Pain at the  calcaneal tuber.  Pain with resisted dorsiflexion eversion of the foot.  No pain with plantarflexion inversion of the foot.  Positive Silfverskiold test still with ankle equinus.  Deep intra-articular ankle pain noted.  Pain with range of motion of the ankle joint  ? ?Radiographs: 3 views of skeletally mature adult right foot: Plantar and posterior heel spurring noted.  Pes planovalgus foot structure noted.  No bony abnormalities noted.  No stress fracture noted. ?Assessment:  ? ?1. Peroneal tendinitis, right   ?2. Osteochondral lesion   ? ? ?Plan:  ?Patient  was evaluated and treated and all questions answered. ? ?Right peroneal tendinitis versus osteochondral lesion/ankle joint pain ?-All questions or concerns were discussed with the patient in extensive detail ?-Clinically cam boot immobilization helped a little bit.  She does not have any plantar fasciitis pain.  She still has ankle pain that seems to have gotten worse.  The cam boot immobilization did not help with that.  She states that her foot is locking up and is taking some time to get unlocked. ?-MRI was ordered to assess osteochondral lesion versus ankle impingement ?-Flexeril was dispensed for muscle relaxing ? ? ?No follow-ups on file.  ?

## 2021-08-08 ENCOUNTER — Ambulatory Visit
Admission: RE | Admit: 2021-08-08 | Discharge: 2021-08-08 | Disposition: A | Discharge status: Home / Self Care | Payer: 59 | Source: Ambulatory Visit | Attending: Podiatry | Admitting: Podiatry

## 2021-08-08 DIAGNOSIS — M899 Disorder of bone, unspecified: Secondary | ICD-10-CM

## 2021-08-08 DIAGNOSIS — M7671 Peroneal tendinitis, right leg: Secondary | ICD-10-CM

## 2021-08-14 ENCOUNTER — Ambulatory Visit: Payer: 59 | Admitting: Podiatry

## 2021-08-14 DIAGNOSIS — M7671 Peroneal tendinitis, right leg: Secondary | ICD-10-CM

## 2021-08-14 NOTE — Progress Notes (Signed)
?Subjective:  ?Patient ID: Sandra Montes, female    DOB: October 03, 1970,  MRN: [id number] ? ?Chief Complaint  ?Patient presents with  ? Foot Pain  ? ? ?51 y.o. female presents with the above complaint.  Patient presents for follow-up to right ankle and lateral foot pain.  She states she is doing okay.  Cam boot did not help.  The brace does help.  The Flexeril definitely helps.  She denies any other acute complaints ? ? ?Review of Systems: Negative except as noted in the HPI. Denies N/V/F/Ch. ? ?No past medical history on file. ? ?Current Outpatient Medications:  ?  albuterol (VENTOLIN HFA) 108 (90 Base) MCG/ACT inhaler, Inhale into the lungs., Disp: , Rfl:  ?  atorvastatin (LIPITOR) 40 MG tablet, SMARTSIG:1 Tablet(s) By Mouth Every Evening, Disp: 90 tablet, Rfl: 1 ?  B Complex Vitamins (VITAMIN B-COMPLEX) TABS, Take 1 tablet by mouth daily., Disp: , Rfl:  ?  cetirizine (ZYRTEC) 10 MG tablet, Take by mouth., Disp: , Rfl:  ?  Cinnamon 500 MG capsule, Take by mouth., Disp: , Rfl:  ?  cyclobenzaprine (FLEXERIL) 10 MG tablet, Take 1 tablet (10 mg total) by mouth 3 (three) times daily as needed for muscle spasms., Disp: 60 tablet, Rfl: 0 ?  fluticasone (FLONASE) 50 MCG/ACT nasal spray, Place 2 sprays into both nostrils daily., Disp: 16 g, Rfl: 6 ?  lidocaine-prilocaine (EMLA) cream, Apply topically as needed., Disp: , Rfl:  ?  lidocaine-prilocaine (EMLA) cream, Apply topically., Disp: , Rfl:  ?  melatonin 1 MG TABS tablet, Take by mouth., Disp: , Rfl:  ?  metoprolol succinate (TOPROL-XL) 25 MG 24 hr tablet, Take one po qd along with 50 mg ER tablet for total of 75 mg daily, Disp: 90 tablet, Rfl: 1 ?  metoprolol succinate (TOPROL-XL) 50 MG 24 hr tablet, Take one po qd along with 25 mg ER tablet for total of 75 mg daily, Disp: 90 tablet, Rfl: 1 ?  tiotropium (SPIRIVA HANDIHALER) 18 MCG inhalation capsule, Inhale 2 puffs ( 1 cap) inhaled qd, Disp: 30 capsule, Rfl: 2 ?  valsartan-hydrochlorothiazide (DIOVAN-HCT) 320-25 MG  tablet, Take 1 tablet by mouth daily., Disp: 90 tablet, Rfl: 1 ? ?Social History  ? ?Tobacco Use  ?Smoking Status Every Day  ? Packs/day: 1.50  ? Years: 37.00  ? Pack years: 55.50  ? Types: Cigarettes  ?Smokeless Tobacco Not on file  ? ? ?Allergies  ?Allergen Reactions  ? Lisinopril Cough  ? Morphine Other (See Comments)  ?  "tried to take her face off due to itching" ?Other reaction(s): Other (See Comments) ?"tried to take her face off due to itching" ?  ? Sulfa Antibiotics   ?  Other reaction(s): Other (See Comments) ?Pass out ?Other reaction(s): Other (See Comments) ?Pass out ?  ? ?Objective:  ?There were no vitals filed for this visit. ?There is no height or weight on file to calculate BMI. ?Constitutional Well developed. ?Well nourished.  ?Vascular Dorsalis pedis pulses palpable bilaterally. ?Posterior tibial pulses palpable bilaterally. ?Capillary refill normal to all digits.  ?No cyanosis or clubbing noted. ?Pedal hair growth normal.  ?Neurologic Normal speech. ?Oriented to person, place, and time. ?Epicritic sensation to light touch grossly present bilaterally.  ?Dermatologic Nails well groomed and normal in appearance. ?No open wounds. ?No skin lesions.  ?Orthopedic: Pain on palpation along the course of the peroneal tendon including the insertion.  Pain at the calcaneal tuber.  Pain with resisted dorsiflexion eversion of the foot.  No pain  with plantarflexion inversion of the foot.  Positive Silfverskiold test still with ankle equinus.  Deep intra-articular ankle pain noted.  Pain with range of motion of the ankle joint  ? ?Radiographs: 3 views of skeletally mature adult right foot: Plantar and posterior heel spurring noted.  Pes planovalgus foot structure noted.  No bony abnormalities noted.  No stress fracture noted. ?Assessment:  ? ?No diagnosis found. ? ? ?Plan:  ?Patient was evaluated and treated and all questions answered. ? ?Right peroneal tendinitis versus osteochondral lesion/ankle joint  pain ?-All questions or concerns were discussed with the patient in extensive detail ?-Continue using Tri-Lock ankle brace for stability.  She will benefit from physical therapy.  Prescription for physical therapy was given. ?-MRI was evaluated which does not show anything concerning.  Planter fasciitis noted.  No pain or no peroneal tendinitis noted or any other foot and ankle abnormalities.  I discussed this with patient extensive detail. ?-Continue taking Flexeril was dispensed for muscle relaxing ? ? ?No follow-ups on file.  ?

## 2021-08-19 ENCOUNTER — Telehealth: Payer: Self-pay | Admitting: *Deleted

## 2021-08-19 NOTE — Telephone Encounter (Signed)
Left message for patient to call back to schedule follow up lung cancer screening CT scan.  

## 2021-08-20 ENCOUNTER — Encounter: Payer: Self-pay | Admitting: Pulmonary Disease

## 2021-08-20 ENCOUNTER — Telehealth: Payer: Self-pay | Admitting: Acute Care

## 2021-08-20 ENCOUNTER — Telehealth: Payer: Self-pay | Admitting: *Deleted

## 2021-08-20 ENCOUNTER — Ambulatory Visit: Payer: 59 | Admitting: Pulmonary Disease

## 2021-08-20 VITALS — BP 120/76 | HR 66 | Temp 98.0°F | Ht 67.0 in | Wt 205.0 lb

## 2021-08-20 DIAGNOSIS — J432 Centrilobular emphysema: Secondary | ICD-10-CM

## 2021-08-20 DIAGNOSIS — J438 Other emphysema: Secondary | ICD-10-CM | POA: Diagnosis not present

## 2021-08-20 DIAGNOSIS — F17210 Nicotine dependence, cigarettes, uncomplicated: Secondary | ICD-10-CM | POA: Diagnosis not present

## 2021-08-20 MED ORDER — STIOLTO RESPIMAT 2.5-2.5 MCG/ACT IN AERS: 2.0000 | INHALATION_SPRAY | Freq: Every day | RESPIRATORY_TRACT | 11 refills | Status: DC

## 2021-08-20 NOTE — Telephone Encounter (Signed)
Patient returned call from [name] - and asked that [name] call her back on Thursday, 4/20 between 9 - 11:00am.  She has been on business meeting calls both times [name] has called and left messages.   Ph# [phone] ?

## 2021-08-20 NOTE — Patient Instructions (Signed)
Nice to meet you ? ?Stop Spiriva, start Stiolto 2 puffs once a day.  This adds a second medicine in addition to the same medicine and Spiriva to help keep the airways open and hopefully help with your breathing. ? ?I ordered pulmonary function test to further evaluate how well your lungs are working.  These can schedule at your convenience or you can schedule the same day as your follow-up appointment.  What ever is best for you. ? ?Please contact those who are working to schedule the repeat lung cancer CT screening scan, Eric Form, NP is the provider who goes over all of these at our office.  She is fantastic. ? ?Return to clinic in 3 months or sooner as needed with Dr. Silas Flood ?

## 2021-08-20 NOTE — Telephone Encounter (Signed)
Attempted to reach for annual LDCT -- LVMM ?

## 2021-08-21 ENCOUNTER — Other Ambulatory Visit: Payer: Self-pay

## 2021-08-21 DIAGNOSIS — Z87891 Personal history of nicotine dependence: Secondary | ICD-10-CM

## 2021-08-21 DIAGNOSIS — F17200 Nicotine dependence, unspecified, uncomplicated: Secondary | ICD-10-CM

## 2021-08-21 DIAGNOSIS — Z122 Encounter for screening for malignant neoplasm of respiratory organs: Secondary | ICD-10-CM

## 2021-09-03 ENCOUNTER — Ambulatory Visit
Admission: RE | Admit: 2021-09-03 | Discharge: 2021-09-03 | Disposition: A | Discharge status: Home / Self Care | Payer: 59 | Source: Ambulatory Visit | Attending: Acute Care | Admitting: Acute Care

## 2021-09-03 ENCOUNTER — Other Ambulatory Visit: Payer: Self-pay

## 2021-09-03 DIAGNOSIS — Z6832 Body mass index (BMI) 32.0-32.9, adult: Secondary | ICD-10-CM | POA: Insufficient documentation

## 2021-09-03 DIAGNOSIS — Z122 Encounter for screening for malignant neoplasm of respiratory organs: Secondary | ICD-10-CM | POA: Insufficient documentation

## 2021-09-03 DIAGNOSIS — J439 Emphysema, unspecified: Secondary | ICD-10-CM | POA: Diagnosis not present

## 2021-09-03 DIAGNOSIS — Z8616 Personal history of COVID-19: Secondary | ICD-10-CM | POA: Insufficient documentation

## 2021-09-03 DIAGNOSIS — Z87891 Personal history of nicotine dependence: Secondary | ICD-10-CM

## 2021-09-03 DIAGNOSIS — I2510 Atherosclerotic heart disease of native coronary artery without angina pectoris: Secondary | ICD-10-CM | POA: Insufficient documentation

## 2021-09-03 DIAGNOSIS — F17200 Nicotine dependence, unspecified, uncomplicated: Secondary | ICD-10-CM

## 2021-09-03 DIAGNOSIS — F17210 Nicotine dependence, cigarettes, uncomplicated: Secondary | ICD-10-CM | POA: Insufficient documentation

## 2021-09-05 ENCOUNTER — Other Ambulatory Visit: Payer: Self-pay

## 2021-09-05 DIAGNOSIS — F17200 Nicotine dependence, unspecified, uncomplicated: Secondary | ICD-10-CM

## 2021-09-05 DIAGNOSIS — Z87891 Personal history of nicotine dependence: Secondary | ICD-10-CM

## 2021-09-05 DIAGNOSIS — Z122 Encounter for screening for malignant neoplasm of respiratory organs: Secondary | ICD-10-CM

## 2021-09-30 ENCOUNTER — Ambulatory Visit: Payer: 59 | Admitting: Dermatology

## 2021-09-30 DIAGNOSIS — L820 Inflamed seborrheic keratosis: Secondary | ICD-10-CM

## 2021-09-30 DIAGNOSIS — L578 Other skin changes due to chronic exposure to nonionizing radiation: Secondary | ICD-10-CM

## 2021-09-30 DIAGNOSIS — L814 Other melanin hyperpigmentation: Secondary | ICD-10-CM | POA: Diagnosis not present

## 2021-09-30 DIAGNOSIS — B079 Viral wart, unspecified: Secondary | ICD-10-CM

## 2021-09-30 DIAGNOSIS — L821 Other seborrheic keratosis: Secondary | ICD-10-CM | POA: Diagnosis not present

## 2021-09-30 DIAGNOSIS — L858 Other specified epidermal thickening: Secondary | ICD-10-CM

## 2021-09-30 DIAGNOSIS — D2239 Melanocytic nevi of other parts of face: Secondary | ICD-10-CM | POA: Diagnosis not present

## 2021-09-30 DIAGNOSIS — D1800 Hemangioma unspecified site: Secondary | ICD-10-CM

## 2021-09-30 NOTE — Patient Instructions (Addendum)
Recommend starting moisturizer with exfoliant (Urea, Salicylic acid, or Lactic acid) one to two times daily to help smooth rough and bumpy skin.  OTC options include Cetaphil Rough and Bumpy lotion (Urea), Eucerin Roughness Relief lotion or spot treatment cream (Urea), CeraVe SA lotion/cream for Rough and Bumpy skin (Sal Acid), Gold Bond Rough and Bumpy cream (Sal Acid), and AmLactin 12% lotion/cream (Lactic Acid).  If applying in morning, also apply sunscreen to sun-exposed areas, since these exfoliating moisturizers can increase sensitivity to sun.   If You Need Anything After Your Visit  If you have any questions or concerns for your doctor, please call our main line at [phone] and press option 4 to reach your doctor's medical assistant. If no one answers, please leave a voicemail as directed and we will return your call as soon as possible. Messages left after 4 pm will be answered the following business day.   You may also send us a message via MyChart. We typically respond to MyChart messages within 1-2 business days.  For prescription refills, please ask your pharmacy to contact our office. Our fax number is [phone].  If you have an urgent issue when the clinic is closed that cannot wait until the next business day, you can page your doctor at the number below.    Please note that while we do our best to be available for urgent issues outside of office hours, we are not available 24/7.   If you have an urgent issue and are unable to reach us, you may choose to seek medical care at your doctor's office, retail clinic, urgent care center, or emergency room.  If you have a medical emergency, please immediately call 911 or go to the emergency department.  Pager Numbers  - Dr. Kowalski: [phone]  - Dr. Moye: [phone]  - Dr. Stewart: [phone]  In the event of inclement weather, please call our main line at [phone] for an update on the status of any delays or  closures.  Dermatology Medication Tips: Please keep the boxes that topical medications come in in order to help keep track of the instructions about where and how to use these. Pharmacies typically print the medication instructions only on the boxes and not directly on the medication tubes.   If your medication is too expensive, please contact our office at [phone] option 4 or send us a message through MyChart.   We are unable to tell what your co-pay for medications will be in advance as this is different depending on your insurance coverage. However, we may be able to find a substitute medication at lower cost or fill out paperwork to get insurance to cover a needed medication.   If a prior authorization is required to get your medication covered by your insurance company, please allow us 1-2 business days to complete this process.  Drug prices often vary depending on where the prescription is filled and some pharmacies may offer cheaper prices.  The website www.goodrx.com contains coupons for medications through different pharmacies. The prices here do not account for what the cost may be with help from insurance (it may be cheaper with your insurance), but the website can give you the price if you did not use any insurance.  - You can print the associated coupon and take it with your prescription to the pharmacy.  - You may also stop by our office during regular business hours and pick up a GoodRx coupon card.  - If you need your prescription   sent electronically to a different pharmacy, notify our office through [facility] MyChart or by phone at [phone] option 4.     Si Usted Necesita Algo Despus de Su Visita  Tambin puede enviarnos un mensaje a travs de MyChart. Por lo general respondemos a los mensajes de MyChart en el transcurso de 1 a 2 das hbiles.  Para renovar recetas, por favor pida a su farmacia que se ponga en contacto con nuestra oficina. Nuestro nmero de fax  es el [phone].  Si tiene un asunto urgente cuando la clnica est cerrada y que no puede esperar hasta el siguiente da hbil, puede llamar/localizar a su doctor(a) al nmero que aparece a continuacin.   Por favor, tenga en cuenta que aunque hacemos todo lo posible para estar disponibles para asuntos urgentes fuera del horario de oficina, no estamos disponibles las 24 horas del da, los 7 das de la semana.   Si tiene un problema urgente y no puede comunicarse con nosotros, puede optar por buscar atencin mdica  en el consultorio de su doctor(a), en una clnica privada, en un centro de atencin urgente o en una sala de emergencias.  Si tiene una emergencia mdica, por favor llame inmediatamente al 911 o vaya a la sala de emergencias.  Nmeros de bper  - Dr. Kowalski: [phone]  - Dra. Moye: [phone]  - Dra. Stewart: [phone]  En caso de inclemencias del tiempo, por favor llame a nuestra lnea principal al [phone] para una actualizacin sobre el estado de cualquier retraso o cierre.  Consejos para la medicacin en dermatologa: Por favor, guarde las cajas en las que vienen los medicamentos de uso tpico para ayudarle a seguir las instrucciones sobre dnde y cmo usarlos. Las farmacias generalmente imprimen las instrucciones del medicamento slo en las cajas y no directamente en los tubos del medicamento.   Si su medicamento es muy caro, por favor, pngase en contacto con nuestra oficina llamando al [phone] y presione la opcin 4 o envenos un mensaje a travs de MyChart.   No podemos decirle cul ser su copago por los medicamentos por adelantado ya que esto es diferente dependiendo de la cobertura de su seguro. Sin embargo, es posible que podamos encontrar un medicamento sustituto a menor costo o llenar un formulario para que el seguro cubra el medicamento que se considera necesario.   Si se requiere una autorizacin previa para que su compaa de seguros cubra  su medicamento, por favor permtanos de 1 a 2 das hbiles para completar este proceso.  Los precios de los medicamentos varan con frecuencia dependiendo del lugar de dnde se surte la receta y alguna farmacias pueden ofrecer precios ms baratos.  El sitio web www.goodrx.com tiene cupones para medicamentos de diferentes farmacias. Los precios aqu no tienen en cuenta lo que podra costar con la ayuda del seguro (puede ser ms barato con su seguro), pero el sitio web puede darle el precio si no utiliz ningn seguro.  - Puede imprimir el cupn correspondiente y llevarlo con su receta a la farmacia.  - Tambin puede pasar por nuestra oficina durante el horario de atencin regular y recoger una tarjeta de cupones de GoodRx.  - Si necesita que su receta se enve electrnicamente a una farmacia diferente, informe a nuestra oficina a travs de MyChart de [place] o por telfono llamando al [phone] y presione la opcin 4.  

## 2021-09-30 NOTE — Progress Notes (Signed)
New Patient Visit  Subjective  Sandra Montes is a 51 y.o. female who presents for the following: Irregular skin lesions (On the L hand, and nose - fhx of skin cancer, personal hx of precancerous lesions that have been treated with LN2 and excision). The patient has spots, moles and lesions to be evaluated, some may be new or changing and the patient has concerns that these could be cancer.  The following portions of the chart were reviewed this encounter and updated as appropriate:       Review of Systems:  No other skin or systemic complaints except as noted in HPI or Assessment and Plan.  Objective  Well appearing patient in no apparent distress; mood and affect are within normal limits.  A focused examination was performed including the face, chest, and hands. Relevant physical exam findings are noted in the Assessment and Plan.  R nasal dorsum 1.5 mm flesh colored flat papule.  L hand dorsum x 1 0.5 cm firm flesh papule with central depressed core.  R lat thigh x 1 Erythematous stuck-on, waxy papule     Assessment & Plan  Fibrous papule of nose R nasal dorsum  Benign-appearing.  Observation.  Call clinic for new or changing lesions.  Recommend daily use of broad spectrum spf 30+ sunscreen to sun-exposed areas.    Viral warts, unspecified type L hand dorsum x 1  Discussed viral etiology and risk of spread.  Discussed multiple treatments may be required to clear warts.  Discussed possible post-treatment dyspigmentation and risk of recurrence.  Destruction of lesion - L hand dorsum x 1  Destruction method: cryotherapy   Informed consent: discussed and consent obtained   Lesion destroyed using liquid nitrogen: Yes   Region frozen until ice ball extended beyond lesion: Yes   Outcome: patient tolerated procedure well with no complications   Post-procedure details: wound care instructions given   Additional details:  Prior to procedure, discussed risks of blister  formation, small wound, skin dyspigmentation, or rare scar following cryotherapy. Recommend Vaseline ointment to treated areas while healing.   Inflamed seborrheic keratosis R lat thigh x 1  Destruction of lesion - R lat thigh x 1  Destruction method: cryotherapy   Informed consent: discussed and consent obtained   Timeout:  patient name, date of birth, surgical site, and procedure verified Lesion destroyed using liquid nitrogen: Yes   Region frozen until ice ball extended beyond lesion: Yes   Outcome: patient tolerated procedure well with no complications   Post-procedure details: wound care instructions given   Additional details:  Prior to procedure, discussed risks of blister formation, small wound, skin dyspigmentation, or rare scar following cryotherapy. Recommend Vaseline ointment to treated areas while healing.    Actinic Damage - chronic, secondary to cumulative UV radiation exposure/sun exposure over time - diffuse scaly erythematous macules with underlying dyspigmentation - Recommend daily broad spectrum sunscreen SPF 30+ to sun-exposed areas, reapply every 2 hours as needed.  - Recommend staying in the shade or wearing long sleeves, sun glasses (UVA+UVB protection) and wide brim hats (4-inch brim around the entire circumference of the hat). - Call for new or changing lesions.  Seborrheic Keratoses - Stuck-on, waxy, tan-brown papules and/or plaques  - Benign-appearing - Discussed benign etiology and prognosis. - Observe - Call for any changes  Lentigines - Scattered tan macules - Due to sun exposure - Benign-appering, observe - Recommend daily broad spectrum sunscreen SPF 30+ to sun-exposed areas, reapply every 2 hours as needed. -  Call for any changes  Hemangiomas - Red papules - Discussed benign nature - Observe - Call for any changes  Keratosis Pilaris - Tiny follicular keratotic papules - Benign. Genetic in nature. No cure. - Observe. - If desired,  patient can use an emollient (moisturizer) containing ammonium lactate, urea or salicylic acid once a day to smooth the area  Return for 1 mth wart follow up and TBSE.  Luther Redo, CMA, am acting as scribe for Brendolyn Patty, MD .  Documentation: I have reviewed the above documentation for accuracy and completeness, and I agree with the above.  Brendolyn Patty MD

## 2021-10-06 ENCOUNTER — Encounter: Payer: Self-pay | Admitting: Podiatry

## 2021-10-09 ENCOUNTER — Ambulatory Visit (INDEPENDENT_AMBULATORY_CARE_PROVIDER_SITE_OTHER): Payer: 59

## 2021-10-09 ENCOUNTER — Ambulatory Visit: Payer: 59 | Admitting: Podiatry

## 2021-10-09 DIAGNOSIS — M899 Disorder of bone, unspecified: Secondary | ICD-10-CM

## 2021-10-09 DIAGNOSIS — S99912A Unspecified injury of left ankle, initial encounter: Secondary | ICD-10-CM | POA: Diagnosis not present

## 2021-10-09 DIAGNOSIS — M7671 Peroneal tendinitis, right leg: Secondary | ICD-10-CM

## 2021-10-09 DIAGNOSIS — M949 Disorder of cartilage, unspecified: Secondary | ICD-10-CM | POA: Diagnosis not present

## 2021-10-09 NOTE — Progress Notes (Signed)
Subjective:  Patient ID: Sandra Montes, female    DOB: 1970-10-31,  MRN: [id number]  Chief Complaint  Patient presents with   Foot Pain    Bilateral foot pain , patient states right foot has gotten better with PT , left foot patient states she recently & rolled foot on some gravel and heard foot pop    51 y.o. female presents with the above complaint.  Patient presents with follow-up of right peroneal tendinitis/ankle pain.  She states she is doing a lot better physical therapy is helping.  However she left rolled the left side.  She states the left side is hurting a lot and has progressive gotten worse.  Now the pain is doing much better but before it was very swollen and there was some ecchymosis present.  Now her pain is brought on considerably.  She put herself in the boot.   Review of Systems: Negative except as noted in the HPI. Denies N/V/F/Ch.  No past medical history on file.  Current Outpatient Medications:    albuterol (VENTOLIN HFA) 108 (90 Base) MCG/ACT inhaler, Inhale into the lungs., Disp: , Rfl:    atorvastatin (LIPITOR) 40 MG tablet, SMARTSIG:1 Tablet(s) By Mouth Every Evening, Disp: 90 tablet, Rfl: 1   B Complex Vitamins (VITAMIN B-COMPLEX) TABS, Take 1 tablet by mouth daily., Disp: , Rfl:    cetirizine (ZYRTEC) 10 MG tablet, Take by mouth., Disp: , Rfl:    Cinnamon 500 MG capsule, Take by mouth., Disp: , Rfl:    cyclobenzaprine (FLEXERIL) 10 MG tablet, Take 1 tablet (10 mg total) by mouth 3 (three) times daily as needed for muscle spasms., Disp: 60 tablet, Rfl: 0   fluticasone (FLONASE) 50 MCG/ACT nasal spray, Place 2 sprays into both nostrils daily., Disp: 16 g, Rfl: 6   lidocaine-prilocaine (EMLA) cream, Apply topically as needed., Disp: , Rfl:    lidocaine-prilocaine (EMLA) cream, Apply topically., Disp: , Rfl:    melatonin 1 MG TABS tablet, Take by mouth., Disp: , Rfl:    metoprolol succinate (TOPROL-XL) 25 MG 24 hr tablet, Take one po qd along with 50 mg ER  tablet for total of 75 mg daily, Disp: 90 tablet, Rfl: 1   metoprolol succinate (TOPROL-XL) 50 MG 24 hr tablet, Take one po qd along with 25 mg ER tablet for total of 75 mg daily, Disp: 90 tablet, Rfl: 1   Tiotropium Bromide-Olodaterol (STIOLTO RESPIMAT) 2.5-2.5 MCG/ACT AERS, Inhale 2 puffs into the lungs daily., Disp: 1 each, Rfl: 11   valsartan-hydrochlorothiazide (DIOVAN-HCT) 320-25 MG tablet, Take 1 tablet by mouth daily., Disp: 90 tablet, Rfl: 1  Social History   Tobacco Use  Smoking Status Every Day   Packs/day: 1.50   Years: 37.00   Total pack years: 55.50   Types: Cigarettes  Smokeless Tobacco Not on file  Tobacco Comments   Pt state 1-1.5 ppd 08/20/21    Allergies  Allergen Reactions   Lisinopril Cough   Morphine Other (See Comments)    "tried to take her face off due to itching" Other reaction(s): Other (See Comments) "tried to take her face off due to itching"    Sulfa Antibiotics     Other reaction(s): Other (See Comments) Pass out Other reaction(s): Other (See Comments) Pass out    Objective:  There were no vitals filed for this visit. There is no height or weight on file to calculate BMI. Constitutional Well developed. Well nourished.  Vascular Dorsalis pedis pulses palpable bilaterally. Posterior tibial pulses palpable bilaterally.  Capillary refill normal to all digits.  No cyanosis or clubbing noted. Pedal hair growth normal.  Neurologic Normal speech. Oriented to person, place, and time. Epicritic sensation to light touch grossly present bilaterally.  Dermatologic Nails well groomed and normal in appearance. No open wounds. No skin lesions.  Orthopedic: Very mild pain on palpation along the course of the peroneal tendon including the insertion.  Very mild pain at the calcaneal tuber.  Mild.  Pain with resisted dorsiflexion eversion of the foot.  No pain with plantarflexion inversion of the foot.  Positive Silfverskiold test still with ankle equinus.   Deep intra-articular ankle pain noted.  Mild pain with range of motion of the ankle joint  Pain on palpation left lateral ankle.  Pain at the ATFL ligament.  Negative anterior drawer test or talar tilt test noted.  No pain on the posterior tibial tendon, peroneal tendon, Achilles tendon.  Pain with plantarflexion inversion of the foot   Radiographs: 3 views of skeletally mature adult left foot: No fracture noted.  Ankle mortise within normal limits no gapping noted.  IMPRESSION: 1. Plantar fasciitis with partial-thickness interstitial tear. 2. No osteochondral lesion. 3. Small ganglion cyst along the superficial margin of the PTFL measuring 12 mm. Assessment:   1. Injury of left ankle, initial encounter   2. Peroneal tendinitis, right   3. Osteochondral lesion      Plan:  Patient was evaluated and treated and all questions answered.  Right peroneal tendinitis versus osteochondral lesion/ankle joint pain -All questions or concerns were discussed with the patient in extensive detail -Continue physical therapy and Tri-Lock ankle brace for stability to the right side.  It seems to help a lot with the pain. -Continue taking Flexeril was dispensed for muscle relaxing  Left ankle sprain -I explained to patient the etiology of ankle sprain versus treatment options were discussed.  Given the instability and the pain that she is experiencing she will benefit from cam boot immobilization for next 4 weeks.  She states understand we will wear the boot to the left side. -Does not show any radiographic evidence of fracture   No follow-ups on file.

## 2021-10-11 NOTE — Progress Notes (Signed)
$'@Patient'O$  ID: Sandra Montes, female    DOB: 04-07-1971, 51 y.o.   MRN: [id number]  Chief Complaint  Patient presents with   Consult    Consult for new emphysema. Pt states she has SOB and coughing. Dx last year in Jan 22.     Referring provider: Eugenia Pancoast, FNP  HPI:   51 y.o. woman whom are seen in consultation for evaluation of emphysema.  Note from referring provider reviewed.  Patient with chief complaint of dyspnea on exertion.  Worse on inclines or stairs.  No time of day when things are better or worse.  No position make things better or worse.  No seasonal environmental factors she can identify to make things better or worse.  She was placed on Spiriva.  Does not think it helped.  No other relieving or exacerbating factors.  Reviewed most recent chest imaging CT lung cancer screening 06/2020 that on my review interpretation reveals clear lungs with emphysematous changes.  PMH: Tobacco abuse, emphysema, hyperlipidemia, seasonal allergies, hypertension Surgical history: C-section, cholecystectomy, tubal ligation Family history: Mother with CAD, father with alcohol abuse Social history: Current smoker, approximately 55-pack-year, lives in [place] / Pulmonary Flowsheets:   ACT:      No data to display          MMRC:     No data to display          Epworth:      No data to display          Tests:   FENO:  No results found for: "NITRICOXIDE"  PFT:     No data to display          WALK:      No data to display          Imaging: Personally reviewed and as per EMR discussion this note  Lab Results: Personally reviewed, notably polycythemia present CBC    Component Value Date/Time   WBC 9.4 07/04/2021 1510   RBC 5.49 (H) 07/04/2021 1510   HGB 17.2 (H) 07/04/2021 1510   HCT 49.8 (H) 07/04/2021 1510   PLT 230 07/04/2021 1510   MCV 90.7 07/04/2021 1510   MCH 31.3 07/04/2021 1510   MCHC 34.5 07/04/2021  1510   RDW 12.5 07/04/2021 1510    BMET    Component Value Date/Time   NA 139 07/04/2021 1510   K 4.0 07/04/2021 1510   CL 98 07/04/2021 1510   CO2 31 07/04/2021 1510   GLUCOSE 96 07/04/2021 1510   BUN 12 07/04/2021 1510   CREATININE 0.65 07/04/2021 1510   CALCIUM 9.2 07/04/2021 1510    BNP No results found for: "BNP"  ProBNP No results found for: "PROBNP"  Specialty Problems       Pulmonary Problems   Centrilobular emphysema ([place])   Non-seasonal allergic rhinitis due to pollen    Allergies  Allergen Reactions   Lisinopril Cough   Morphine Other (See Comments)    "tried to take her face off due to itching" Other reaction(s): Other (See Comments) "tried to take her face off due to itching"    Sulfa Antibiotics     Other reaction(s): Other (See Comments) Pass out Other reaction(s): Other (See Comments) Pass out      There is no immunization history on file for this patient.  History reviewed. No pertinent past medical history.  Tobacco History: Social History   Tobacco Use  Smoking Status Every Day   Packs/day:  1.50   Years: 37.00   Total pack years: 55.50   Types: Cigarettes  Smokeless Tobacco Not on file  Tobacco Comments   Pt state 1-1.5 ppd 08/20/21   Ready to quit: Not Answered Counseling given: Not Answered Tobacco comments: Pt state 1-1.5 ppd 08/20/21   Continue to not smoke  Outpatient Encounter Medications as of 08/20/2021  Medication Sig   albuterol (VENTOLIN HFA) 108 (90 Base) MCG/ACT inhaler Inhale into the lungs.   atorvastatin (LIPITOR) 40 MG tablet SMARTSIG:1 Tablet(s) By Mouth Every Evening   B Complex Vitamins (VITAMIN B-COMPLEX) TABS Take 1 tablet by mouth daily.   cetirizine (ZYRTEC) 10 MG tablet Take by mouth.   Cinnamon 500 MG capsule Take by mouth.   cyclobenzaprine (FLEXERIL) 10 MG tablet Take 1 tablet (10 mg total) by mouth 3 (three) times daily as needed for muscle spasms.   fluticasone (FLONASE) 50 MCG/ACT nasal  spray Place 2 sprays into both nostrils daily.   lidocaine-prilocaine (EMLA) cream Apply topically as needed.   lidocaine-prilocaine (EMLA) cream Apply topically.   melatonin 1 MG TABS tablet Take by mouth.   metoprolol succinate (TOPROL-XL) 25 MG 24 hr tablet Take one po qd along with 50 mg ER tablet for total of 75 mg daily   metoprolol succinate (TOPROL-XL) 50 MG 24 hr tablet Take one po qd along with 25 mg ER tablet for total of 75 mg daily   Tiotropium Bromide-Olodaterol (STIOLTO RESPIMAT) 2.5-2.5 MCG/ACT AERS Inhale 2 puffs into the lungs daily.   valsartan-hydrochlorothiazide (DIOVAN-HCT) 320-25 MG tablet Take 1 tablet by mouth daily.   [DISCONTINUED] tiotropium (SPIRIVA HANDIHALER) 18 MCG inhalation capsule Inhale 2 puffs ( 1 cap) inhaled qd   No facility-administered encounter medications on file as of 08/20/2021.     Review of Systems  Review of Systems  No chest pain with exertion.  No orthopnea or PND.  Comprehensive review of systems otherwise negative. Physical Exam  BP 120/76 (BP Location: Left Arm, Patient Position: Sitting, Cuff Size: Normal)   Pulse 66   Temp 98 F (36.7 C) (Oral)   Ht '5\' 7"'$  (1.702 m)   Wt 205 lb (93 kg)   SpO2 97%   BMI 32.11 kg/m   Wt Readings from Last 5 Encounters:  08/20/21 205 lb (93 kg)  07/04/21 205 lb (93 kg)  06/05/20 221 lb (100.2 kg)    BMI Readings from Last 5 Encounters:  08/20/21 32.11 kg/m  07/04/21 32.59 kg/m  06/05/20 34.61 kg/m     Physical Exam General: Well-appearing, no acute distress Eyes: EOMI, no icterus Neck: Supple, no JVP Pulmonary: Distant, clear Cardiovascular: Regular in rhythm, no murmur Abdomen: Nondistended, bowel sounds present MSK: No synovitis, no joint effusion Neuro: Normal gait, no weakness Psych: Normal mood, full affect   Assessment & Plan:   Emphysema: As demonstrated on CT scan.  [place] of tobacco abuse, cigarette smoking.  Dyspnea on exertion: Suspect related to emphysema.   PFTs for further evaluation, assess presence of COPD.  In the meantime, escalate Spiriva (discontinue) to [employer].  Assess response.  Tobacco abuse: Lost follow-up for lung cancer CT scan.  Encouraged her to follow-up with Eric Form, NP in our clinic for reenrollment in lung cancer screening.   Return in about 3 months (around 11/19/2021).   Lanier Clam, MD 10/11/2021

## 2021-10-13 ENCOUNTER — Encounter: Payer: Self-pay | Admitting: Family

## 2021-10-13 ENCOUNTER — Ambulatory Visit (INDEPENDENT_AMBULATORY_CARE_PROVIDER_SITE_OTHER): Payer: 59 | Admitting: Family

## 2021-10-13 VITALS — BP 118/72 | HR 82 | Temp 98.1°F | Resp 16 | Ht 67.0 in | Wt 208.2 lb

## 2021-10-13 DIAGNOSIS — Z0001 Encounter for general adult medical examination with abnormal findings: Secondary | ICD-10-CM | POA: Insufficient documentation

## 2021-10-13 DIAGNOSIS — Z1231 Encounter for screening mammogram for malignant neoplasm of breast: Secondary | ICD-10-CM | POA: Diagnosis not present

## 2021-10-13 DIAGNOSIS — Z1211 Encounter for screening for malignant neoplasm of colon: Secondary | ICD-10-CM | POA: Diagnosis not present

## 2021-10-13 DIAGNOSIS — E785 Hyperlipidemia, unspecified: Secondary | ICD-10-CM

## 2021-10-13 DIAGNOSIS — K580 Irritable bowel syndrome with diarrhea: Secondary | ICD-10-CM

## 2021-10-13 DIAGNOSIS — J432 Centrilobular emphysema: Secondary | ICD-10-CM

## 2021-10-13 DIAGNOSIS — J301 Allergic rhinitis due to pollen: Secondary | ICD-10-CM

## 2021-10-13 DIAGNOSIS — Z720 Tobacco use: Secondary | ICD-10-CM

## 2021-10-13 DIAGNOSIS — R739 Hyperglycemia, unspecified: Secondary | ICD-10-CM

## 2021-10-13 NOTE — Assessment & Plan Note (Signed)
Smoking cessation instruction/counseling given:  counseled patient on the dangers of tobacco use, advised patient to stop smoking, and reviewed strategies to maximize success 

## 2021-10-13 NOTE — Assessment & Plan Note (Signed)
Suspected IBS Pt also referred for screening colonoscopy Print out given for appropriate foods to see if anal leaking concerns resolve. If not will consider stool cultures and referral GI

## 2021-10-13 NOTE — Assessment & Plan Note (Signed)
Cont f/u with pulmonary as scheduled Work on smoking cessation

## 2021-10-13 NOTE — Assessment & Plan Note (Signed)
Referred to GI for colonoscopy 

## 2021-10-13 NOTE — Assessment & Plan Note (Signed)
Work on diabetic diet exercise as tolerated 

## 2021-10-13 NOTE — Patient Instructions (Addendum)
You should be contacted about your referral for colonoscopy within the next week.    Recommend omega 3 fish oils daily for your triglycerides  Recommend floragen probiotic.   Due to recent changes in healthcare laws, you may see results of your imaging and/or laboratory studies on MyChart before I have had a chance to review them.  I understand that in some cases there may be results that are confusing or concerning to you. Please understand that not all results are received at the same time and often I may need to interpret multiple results in order to provide you with the best plan of care or course of treatment. Therefore, I ask that you please give me 2 business days to thoroughly review all your results before contacting my office for clarification. Should we see a critical lab result, you will be contacted sooner.   It was a pleasure seeing you today! Please do not hesitate to reach out with any questions and or concerns.  Regards,   Eugenia Pancoast FNP-C

## 2021-10-13 NOTE — Assessment & Plan Note (Signed)
Lipid panel ordered pending results.  Continue atorvastatin 40 mg Start omega 3 fish oils Will check lipid panel FASTING next visit

## 2021-10-13 NOTE — Assessment & Plan Note (Signed)
Mammogram ordered. Pending results. 

## 2021-10-13 NOTE — Assessment & Plan Note (Signed)
Patient Counseling(The following topics were reviewed): ? Preventative care handout given to pt  ?Health maintenance and immunizations reviewed. Please refer to Health maintenance section. ?Pt advised on safe sex, wearing seatbelts in car, and proper nutrition ?labwork ordered today for annual ?Dental health: Discussed importance of regular tooth brushing, flossing, and dental visits. ? ? ?

## 2021-10-13 NOTE — Progress Notes (Signed)
Established Patient Office Visit  Subjective:  Patient ID: Sandra Montes, female    DOB: 28-Apr-1971  Age: 51 y.o. MRN: [id number]  CC:  Chief Complaint  Patient presents with   Annual Exam    HPI Sandra Montes is here today for an annual comprehensive exam.   Pt is with acute concerns.   Lately having issues with anal leakage, even after cleaning she will see that she still has some on her anal area. Some diarrhea, but this is chronic for her. She has noticed her stomach is much more sensitive to things that she usually could tolerate. Has noticed increased gas. No longer with gallbladder. Diarrhea typically 3-4 times a week, otherwise semi formed.   Pap smear: is now seeing gynecology , last pap was   Chronic problems addressed today:   Copd: next f/u with pulmonary is in the next few weeks. Trying to work on ways to creatively quit or d/c smoking habits. They are working on weaning from 34 cigarettes a day to 26 cigarettes.   Elevated triglycerides : atorvastatin 40 mg once daily.    History reviewed. No pertinent past medical history.  Past Surgical History:  Procedure Laterality Date   CESAREAN SECTION     1993   CHOLECYSTECTOMY     1996   TUBAL LIGATION     1995    Family History  Problem Relation Age of Onset   Alcohol abuse Mother    Heart attack Mother 28   Alcohol abuse Father    Skin cancer Paternal Grandfather    Asthma Daughter     Social History   Socioeconomic History   Marital status: Divorced    Spouse name: Not on file   Number of children: 3   Years of education: Not on file   Highest education level: Not on file  Occupational History    Employer: [employer]  Tobacco Use   Smoking status: Every Day    Packs/day: 1.50    Years: 37.00    Total pack years: 55.50    Types: Cigarettes   Smokeless tobacco: Not on file   Tobacco comments:    Pt state 1-1.5 ppd 08/20/21  Vaping Use   Vaping Use: Never used  Substance  and Sexual Activity   Alcohol use: Not Currently    Comment: h/o alcoholism quit 2017   Drug use: Yes    Types: Marijuana    Comment: edibles   Sexual activity: Not Currently    Partners: Male  Other Topics Concern   Not on file  Social History Narrative   Not on file   Social Determinants of Health   Financial Resource Strain: Not on file  Food Insecurity: Not on file  Transportation Needs: Not on file  Physical Activity: Not on file  Stress: Not on file  Social Connections: Not on file  Intimate Partner Violence: Not on file    Outpatient Medications Prior to Visit  Medication Sig Dispense Refill   albuterol (VENTOLIN HFA) 108 (90 Base) MCG/ACT inhaler Inhale into the lungs.     atorvastatin (LIPITOR) 40 MG tablet SMARTSIG:1 Tablet(s) By Mouth Every Evening 90 tablet 1   B Complex Vitamins (VITAMIN B-COMPLEX) TABS Take 1 tablet by mouth daily.     cetirizine (ZYRTEC) 10 MG tablet Take by mouth.     Cinnamon 500 MG capsule Take by mouth.     cyclobenzaprine (FLEXERIL) 10 MG tablet Take 1 tablet (10 mg total) by mouth 3 (three)  times daily as needed for muscle spasms. 60 tablet 0   fluticasone (FLONASE) 50 MCG/ACT nasal spray Place 2 sprays into both nostrils daily. 16 g 6   lidocaine-prilocaine (EMLA) cream Apply topically as needed.     lidocaine-prilocaine (EMLA) cream Apply topically.     Magnesium Cl-Calcium Carbonate (SLOW MAGNESIUM/CALCIUM PO) Take by mouth.     melatonin 1 MG TABS tablet Take by mouth.     metoprolol succinate (TOPROL-XL) 25 MG 24 hr tablet Take one po qd along with 50 mg ER tablet for total of 75 mg daily 90 tablet 1   metoprolol succinate (TOPROL-XL) 50 MG 24 hr tablet Take one po qd along with 25 mg ER tablet for total of 75 mg daily 90 tablet 1   Tiotropium Bromide-Olodaterol (STIOLTO RESPIMAT) 2.5-2.5 MCG/ACT AERS Inhale 2 puffs into the lungs daily. 1 each 11   valsartan-hydrochlorothiazide (DIOVAN-HCT) 320-25 MG tablet Take 1 tablet by mouth  daily. 90 tablet 1   No facility-administered medications prior to visit.    Allergies  Allergen Reactions   Lisinopril Cough   Morphine Other (See Comments)    "tried to take her face off due to itching" Other reaction(s): Other (See Comments) "tried to take her face off due to itching"    Sulfa Antibiotics     Other reaction(s): Other (See Comments) Pass out Other reaction(s): Other (See Comments) Pass out     ROS Review of Systems  Review of Systems  Respiratory:  Negative for shortness of breath.   Cardiovascular:  Negative for chest pain and palpitations.  Gastrointestinal:  Negative for constipation and diarrhea.  Genitourinary:  Negative for dysuria, frequency and urgency.  Musculoskeletal:  Negative for myalgias.  Psychiatric/Behavioral:  Negative for depression and suicidal ideas.   All other systems reviewed and are negative.    Objective:    Physical Exam  Gen: NAD, resting comfortably HEENT: TMs normal bilaterally. OP clear. No thyromegaly noted.  CV: RRR with no murmurs appreciated Pulm: NWOB, CTAB with no crackles, wheezes, or rhonchi GI: Normal bowel sounds present. Soft, Nontender, Nondistended. MSK: no edema, cyanosis, or clubbing noted Skin: warm, dry Neuro: CN2-12 grossly intact. Strength 5/5 in upper and lower extremities.  Psych: Normal affect and thought content  BP 118/72   Pulse 82   Temp 98.1 F (36.7 C)   Resp 16   Ht '5\' 7"'$  (1.702 m)   Wt 208 lb 4 oz (94.5 kg)   LMP 08/18/2021   SpO2 91%   BMI 32.62 kg/m  Wt Readings from Last 3 Encounters:  10/13/21 208 lb 4 oz (94.5 kg)  08/20/21 205 lb (93 kg)  07/04/21 205 lb (93 kg)     Health Maintenance Due  Topic Date Due   PAP SMEAR-Modifier  Never done   COLONOSCOPY (Pts 45-34yr Insurance coverage will need to be confirmed)  Never done   MAMMOGRAM  Never done    There are no preventive care reminders to display for this patient.  No results found for: "TSH" Lab Results   Component Value Date   WBC 9.4 07/04/2021   HGB 17.2 (H) 07/04/2021   HCT 49.8 (H) 07/04/2021   MCV 90.7 07/04/2021   PLT 230 07/04/2021   Lab Results  Component Value Date   NA 139 07/04/2021   K 4.0 07/04/2021   CO2 31 07/04/2021   GLUCOSE 96 07/04/2021   BUN 12 07/04/2021   CREATININE 0.65 07/04/2021   BILITOT 0.6 07/04/2021   AST  16 07/04/2021   ALT 16 07/04/2021   PROT 6.8 07/04/2021   CALCIUM 9.2 07/04/2021   Lab Results  Component Value Date   CHOL 191 07/04/2021   Lab Results  Component Value Date   HDL 52 07/04/2021   Lab Results  Component Value Date   LDLCALC 100 (H) 07/04/2021   Lab Results  Component Value Date   TRIG 272 (H) 07/04/2021   Lab Results  Component Value Date   CHOLHDL 3.7 07/04/2021   Lab Results  Component Value Date   HGBA1C 5.5 07/04/2021      Assessment & Plan:   Problem List Items Addressed This Visit       Respiratory   Centrilobular emphysema ([place])    Cont f/u with pulmonary as scheduled Work on smoking cessation      Non-seasonal allergic rhinitis due to pollen    Continue flonase 50 mcg once daily        Digestive   Irritable bowel syndrome with diarrhea    Suspected IBS Pt also referred for screening colonoscopy Print out given for appropriate foods to see if anal leaking concerns resolve. If not will consider stool cultures and referral GI      Relevant Orders   Ambulatory referral to Gastroenterology     Other   Dyslipidemia    Lipid panel ordered pending results.  Continue atorvastatin 40 mg Start omega 3 fish oils Will check lipid panel FASTING next visit      Tobacco abuse    Smoking cessation instruction/counseling given:  counseled patient on the dangers of tobacco use, advised patient to stop smoking, and reviewed strategies to maximize success       Hyperglycemia    Work on diabetic diet exercise as tolerated      Encounter for screening mammogram for malignant neoplasm of breast     Mammogram ordered. Pending results.       Relevant Orders   MM 3D SCREEN BREAST BILATERAL   Screening for colon cancer - Primary    Referred to GI for colonoscopy.        Relevant Orders   Ambulatory referral to Gastroenterology   Encounter for general adult medical examination with abnormal findings    Patient Counseling(The following topics were reviewed):  Preventative care handout given to pt  Health maintenance and immunizations reviewed. Please refer to Health maintenance section. Pt advised on safe sex, wearing seatbelts in car, and proper nutrition labwork ordered today for annual Dental health: Discussed importance of regular tooth brushing, flossing, and dental visits.         No orders of the defined types were placed in this encounter.   Follow-up: Return in about 3 months (around 01/13/2022) for FASTING in three months for follow up .    Eugenia Pancoast, FNP

## 2021-10-13 NOTE — Assessment & Plan Note (Signed)
Continue flonase 50 mcg once daily

## 2021-10-14 ENCOUNTER — Telehealth: Payer: Self-pay

## 2021-10-14 NOTE — Telephone Encounter (Signed)
CALLED PATIENT NO ANSWER LEFT VOICEMAIL FOR A CALL BACK ? ?

## 2021-10-15 ENCOUNTER — Telehealth: Payer: Self-pay

## 2021-10-15 NOTE — Telephone Encounter (Signed)
CALLED PATIENT NO ANSWER LEFT VOICEMAIL FOR A CALL BACK ? ?

## 2021-10-16 ENCOUNTER — Telehealth: Payer: Self-pay

## 2021-10-16 ENCOUNTER — Other Ambulatory Visit: Payer: Self-pay

## 2021-10-16 DIAGNOSIS — Z1211 Encounter for screening for malignant neoplasm of colon: Secondary | ICD-10-CM

## 2021-10-16 MED ORDER — PEG 3350-KCL-NA BICARB-NACL 420 G PO SOLR: 4000.0000 mL | Freq: Once | ORAL | 0 refills | Status: AC

## 2021-10-16 NOTE — Progress Notes (Signed)
Gastroenterology Pre-Procedure Review  Request Date: 11/18/2021 Requesting Physician: Dr. Marius Ditch  PATIENT REVIEW QUESTIONS: The patient responded to the following health history questions as indicated:    1. Are you having any GI issues? no 2. Do you have a personal history of Polyps? no 3. Do you have a family history of Colon Cancer or Polyps? no 4. Diabetes Mellitus? no 5. Joint replacements in the past 12 months?no 6. Major health problems in the past 3 months?no 7. Any artificial heart valves, MVP, or defibrillator?no    MEDICATIONS & ALLERGIES:    Patient reports the following regarding taking any anticoagulation/antiplatelet therapy:   Plavix, Coumadin, Eliquis, Xarelto, Lovenox, Pradaxa, Brilinta, or Effient? no Aspirin? no  Patient confirms/reports the following medications:  Current Outpatient Medications  Medication Sig Dispense Refill   albuterol (VENTOLIN HFA) 108 (90 Base) MCG/ACT inhaler Inhale into the lungs.     atorvastatin (LIPITOR) 40 MG tablet SMARTSIG:1 Tablet(s) By Mouth Every Evening 90 tablet 1   B Complex Vitamins (VITAMIN B-COMPLEX) TABS Take 1 tablet by mouth daily.     cetirizine (ZYRTEC) 10 MG tablet Take by mouth.     Cinnamon 500 MG capsule Take by mouth.     cyclobenzaprine (FLEXERIL) 10 MG tablet Take 1 tablet (10 mg total) by mouth 3 (three) times daily as needed for muscle spasms. 60 tablet 0   fluticasone (FLONASE) 50 MCG/ACT nasal spray Place 2 sprays into both nostrils daily. 16 g 6   lidocaine-prilocaine (EMLA) cream Apply topically as needed.     lidocaine-prilocaine (EMLA) cream Apply topically.     Magnesium Cl-Calcium Carbonate (SLOW MAGNESIUM/CALCIUM PO) Take by mouth.     melatonin 1 MG TABS tablet Take by mouth.     metoprolol succinate (TOPROL-XL) 25 MG 24 hr tablet Take one po qd along with 50 mg ER tablet for total of 75 mg daily 90 tablet 1   metoprolol succinate (TOPROL-XL) 50 MG 24 hr tablet Take one po qd along with 25 mg ER  tablet for total of 75 mg daily 90 tablet 1   Tiotropium Bromide-Olodaterol (STIOLTO RESPIMAT) 2.5-2.5 MCG/ACT AERS Inhale 2 puffs into the lungs daily. 1 each 11   valsartan-hydrochlorothiazide (DIOVAN-HCT) 320-25 MG tablet Take 1 tablet by mouth daily. 90 tablet 1   No current facility-administered medications for this visit.    Patient confirms/reports the following allergies:  Allergies  Allergen Reactions   Lisinopril Cough   Morphine Other (See Comments)    "tried to take her face off due to itching" Other reaction(s): Other (See Comments) "tried to take her face off due to itching"    Sulfa Antibiotics     Other reaction(s): Other (See Comments) Pass out Other reaction(s): Other (See Comments) Pass out     No orders of the defined types were placed in this encounter.   AUTHORIZATION INFORMATION Primary Insurance: 1D#: Group #:  Secondary Insurance: 1D#: Group #:  SCHEDULE INFORMATION: Date: 11/18/2021 Time: Location: [facility]

## 2021-10-16 NOTE — Telephone Encounter (Signed)
CALLED PATIENT NO ANSWER LEFT VOICEMAIL FOR A CALL BACK °Letter sent °

## 2021-11-05 ENCOUNTER — Ambulatory Visit: Payer: 59 | Admitting: Dermatology

## 2021-11-05 DIAGNOSIS — B079 Viral wart, unspecified: Secondary | ICD-10-CM | POA: Diagnosis not present

## 2021-11-05 DIAGNOSIS — L578 Other skin changes due to chronic exposure to nonionizing radiation: Secondary | ICD-10-CM

## 2021-11-05 DIAGNOSIS — D235 Other benign neoplasm of skin of trunk: Secondary | ICD-10-CM | POA: Diagnosis not present

## 2021-11-05 DIAGNOSIS — Z1283 Encounter for screening for malignant neoplasm of skin: Secondary | ICD-10-CM

## 2021-11-05 DIAGNOSIS — L821 Other seborrheic keratosis: Secondary | ICD-10-CM

## 2021-11-05 DIAGNOSIS — D224 Melanocytic nevi of scalp and neck: Secondary | ICD-10-CM | POA: Diagnosis not present

## 2021-11-05 DIAGNOSIS — D229 Melanocytic nevi, unspecified: Secondary | ICD-10-CM

## 2021-11-05 DIAGNOSIS — L814 Other melanin hyperpigmentation: Secondary | ICD-10-CM

## 2021-11-05 DIAGNOSIS — D239 Other benign neoplasm of skin, unspecified: Secondary | ICD-10-CM

## 2021-11-05 DIAGNOSIS — D1800 Hemangioma unspecified site: Secondary | ICD-10-CM

## 2021-11-05 DIAGNOSIS — L858 Other specified epidermal thickening: Secondary | ICD-10-CM

## 2021-11-05 NOTE — Progress Notes (Signed)
Follow-Up Visit   Subjective  Sandra Montes is a 51 y.o. female who presents for the following: Follow-up.  The patient presents for 1 month follow-up wart of the left hand and Total-Body Skin Exam (TBSE) for skin cancer screening and mole check.  The patient has spots, moles and lesions to be evaluated, some may be new or changing and the patient has concerns that these could be cancer. No personal history of skin cancer, but does have family history of BCC.   The following portions of the chart were reviewed this encounter and updated as appropriate:       Review of Systems:  No other skin or systemic complaints except as noted in HPI or Assessment and Plan.  Objective  Well appearing patient in no apparent distress; mood and affect are within normal limits.  A full examination was performed including scalp, head, eyes, ears, nose, lips, neck, chest, axillae, abdomen, back, buttocks, bilateral upper extremities, bilateral lower extremities, hands, feet, fingers, toes, fingernails, and toenails. All findings within normal limits unless otherwise noted below.  Left hand dorsum Small residual verrucous papule  Upper Back Dilated pore  left neck 2.5 mm medium brown macule    Assessment & Plan  Skin cancer screening performed today.  Actinic Damage - chronic, secondary to cumulative UV radiation exposure/sun exposure over time - diffuse scaly erythematous macules with underlying dyspigmentation - Recommend daily broad spectrum sunscreen SPF 30+ to sun-exposed areas, reapply every 2 hours as needed.  - Recommend staying in the shade or wearing long sleeves, sun glasses (UVA+UVB protection) and wide brim hats (4-inch brim around the entire circumference of the hat). - Call for new or changing lesions.  Hemangiomas - Red papules - Discussed benign nature - Observe - Call for any changes  Lentigines - Scattered tan macules, including lower lip - Due to sun  exposure - Benign-appering, observe - Recommend daily broad spectrum sunscreen SPF 30+ to sun-exposed areas and lips, reapply every 2 hours as needed. - Call for any changes  Melanocytic Nevi - Tan-brown and/or pink-flesh-colored symmetric macules and papules, including left post axilla - Benign appearing on exam today - Observation - Call clinic for new or changing moles - Recommend daily use of broad spectrum spf 30+ sunscreen to sun-exposed areas.   Keratosis Pilaris - Tiny follicular keratotic papules - Benign. Genetic in nature. No cure. - Observe. - If desired, patient can use an emollient (moisturizer) containing ammonium lactate, urea or salicylic acid once a day to smooth the area  Seborrheic Keratoses - Stuck-on, waxy, tan-brown papules and/or plaques  - Benign-appearing - Discussed benign etiology and prognosis. - Observe - Call for any changes  Viral warts, unspecified type Left hand dorsum  Residual  Discussed viral etiology and risk of spread.  Discussed multiple treatments may be required to clear warts.  Discussed possible post-treatment dyspigmentation and risk of recurrence.  Destruction of lesion - Left hand dorsum  Destruction method: cryotherapy   Informed consent: discussed and consent obtained   Lesion destroyed using liquid nitrogen: Yes   Region frozen until ice ball extended beyond lesion: Yes   Outcome: patient tolerated procedure well with no complications   Post-procedure details: wound care instructions given   Additional details:  Prior to procedure, discussed risks of blister formation, small wound, skin dyspigmentation, or rare scar following cryotherapy. Recommend Vaseline ointment to treated areas while healing.   Dilated pore of [place] Upper Back  Benign, observe.    Nevus left  neck  vs Lentigo  Benign-appearing.  Observation.  Call clinic for new or changing moles.  Recommend daily use of broad spectrum spf 30+ sunscreen to  sun-exposed areas.    Return in about 1 month (around 12/06/2021) for wart.  IJamesetta Orleans, CMA, am acting as scribe for Brendolyn Patty, MD .  Documentation: I have reviewed the above documentation for accuracy and completeness, and I agree with the above.  Brendolyn Patty MD

## 2021-11-05 NOTE — Patient Instructions (Addendum)
Recommend starting moisturizer with exfoliant (Urea, Salicylic acid, or Lactic acid) one to two times daily to help smooth rough and bumpy skin.  OTC options include Cetaphil Rough and Bumpy lotion (Urea), Eucerin Roughness Relief lotion or spot treatment cream (Urea), CeraVe SA lotion/cream for Rough and Bumpy skin (Sal Acid), Gold Bond Rough and Bumpy cream (Sal Acid), and AmLactin 12% lotion/cream (Lactic Acid).  If applying in morning, also apply sunscreen to sun-exposed areas, since these exfoliating moisturizers can increase sensitivity to sun.  Cryotherapy Aftercare  Wash gently with soap and water everyday.   Apply Vaseline and Band-Aid daily until healed.   Viral Warts & Molluscum Contagiosum  Viral warts and molluscum contagiosum are growths of the skin caused by viral infection of the skin. If you have been given the diagnosis of viral warts or molluscum contagiosum there are a few things that you must understand about your condition:  There is no guaranteed treatment method available for this condition. Multiple treatments may be required, The treatments may be time consuming and require multiple visits to the dermatology office. The treatment may be expensive. You will be charged each time you come into the office to have the spots treated. The treated areas may develop new lesions further complicating treatment. The treated areas may leave a scar. There is no guarantee that even after multiple treatments that the spots will be successfully treated. These are caused by a viral infection and can be spread to other areas of the skin and to other people by direct contact. Therefore, new spots may occur.  Seborrheic Keratosis  What causes seborrheic keratoses? Seborrheic keratoses are harmless, common skin growths that first appear during adult life.  As time goes by, more growths appear.  Some people may develop a large number of them.  Seborrheic keratoses appear on both covered and  uncovered body parts.  They are not caused by sunlight.  The tendency to develop seborrheic keratoses can be inherited.  They vary in color from skin-colored to gray, brown, or even black.  They can be either smooth or have a rough, warty surface.   Seborrheic keratoses are superficial and look as if they were stuck on the skin.  Under the microscope this type of keratosis looks like layers upon layers of skin.  That is why at times the top layer may seem to fall off, but the rest of the growth remains and re-grows.    Treatment Seborrheic keratoses do not need to be treated, but can easily be removed in the office.  Seborrheic keratoses often cause symptoms when they rub on clothing or jewelry.  Lesions can be in the way of shaving.  If they become inflamed, they can cause itching, soreness, or burning.  Removal of a seborrheic keratosis can be accomplished by freezing, burning, or surgery. If any spot bleeds, scabs, or grows rapidly, please return to have it checked, as these can be an indication of a skin cancer.  Due to recent changes in healthcare laws, you may see results of your pathology and/or laboratory studies on MyChart before the doctors have had a chance to review them. We understand that in some cases there may be results that are confusing or concerning to you. Please understand that not all results are received at the same time and often the doctors may need to interpret multiple results in order to provide you with the best plan of care or course of treatment. Therefore, we ask that you please give [place]  2 business days to thoroughly review all your results before contacting the office for clarification. Should we see a critical lab result, you will be contacted sooner.   If You Need Anything After Your Visit  If you have any questions or concerns for your doctor, please call our main line at [phone] and press option 4 to reach your doctor's medical assistant. If no one answers,  please leave a voicemail as directed and we will return your call as soon as possible. Messages left after 4 pm will be answered the following business day.   You may also send [place] a message via [place]. We typically respond to MyChart messages within 1-2 business days.  For prescription refills, please ask your pharmacy to contact our office. Our fax number is [phone].  If you have an urgent issue when the clinic is closed that cannot wait until the next business day, you can page your doctor at the number below.    Please note that while we do our best to be available for urgent issues outside of office hours, we are not available 24/7.   If you have an urgent issue and are unable to reach [place], you may choose to seek medical care at your doctor's office, retail clinic, urgent care center, or emergency room.  If you have a medical emergency, please immediately call 911 or go to the emergency department.  Pager Numbers  - Dr. Nehemiah Massed: [phone]  - Dr. Laurence Ferrari: [phone]  - Dr. Nicole Kindred: [phone]  In the event of inclement weather, please call our main line at [phone] for an update on the status of any delays or closures.  Dermatology Medication Tips: Please keep the boxes that topical medications come in in order to help keep track of the instructions about where and how to use these. Pharmacies typically print the medication instructions only on the boxes and not directly on the medication tubes.   If your medication is too expensive, please contact our office at [phone] option 4 or send [place] a message through [place].   We are unable to tell what your co-pay for medications will be in advance as this is different depending on your insurance coverage. However, we may be able to find a substitute medication at lower cost or fill out paperwork to get insurance to cover a needed medication.   If a prior authorization is required to get your medication covered by your  insurance company, please allow [place] 1-2 business days to complete this process.  Drug prices often vary depending on where the prescription is filled and some pharmacies may offer cheaper prices.  The website www.goodrx.com contains coupons for medications through different pharmacies. The prices here do not account for what the cost may be with help from insurance (it may be cheaper with your insurance), but the website can give you the price if you did not use any insurance.  - You can print the associated coupon and take it with your prescription to the pharmacy.  - You may also stop by our office during regular business hours and pick up a GoodRx coupon card.  - If you need your prescription sent electronically to a different pharmacy, notify our office through [facility] or by phone at [phone] option 4.     Si Usted Necesita Algo Despus de Su Visita  Tambin puede enviarnos un mensaje a travs de Pharmacist, community. Por lo general respondemos a los mensajes de MyChart en el transcurso de 1 a 2  das hbiles.  Para renovar recetas, por favor pida a su farmacia que se ponga en contacto con nuestra oficina. Harland Dingwall de fax es [place] [phone].  Si tiene un asunto urgente cuando la clnica est cerrada y que no puede esperar hasta el siguiente da hbil, puede llamar/localizar a su doctor(a) al nmero que aparece a continuacin.   Por favor, tenga en cuenta que aunque hacemos todo lo posible para estar disponibles para asuntos urgentes fuera del horario de [place], no estamos disponibles las 24 horas del da, los 7 das de la [place].   Si tiene un problema urgente y no puede comunicarse con nosotros, puede optar por buscar atencin mdica  en el consultorio de su doctor(a), en una clnica privada, en un centro de atencin urgente o en una sala de emergencias.  Si tiene Engineering geologist, por favor llame inmediatamente al 911 o vaya a la sala de emergencias.  Nmeros de  bper  - Dr. Nehemiah Massed: [phone]  - Dra. Moye: [phone]  - Dra. Nicole Kindred: [phone]  En caso de inclemencias del [place], por favor llame a Johnsie Kindred principal al [phone] para una actualizacin sobre el [place] de cualquier retraso o cierre.  Consejos para la medicacin en dermatologa: Por favor, guarde las cajas en las que vienen los medicamentos de uso tpico para ayudarle a seguir las instrucciones sobre dnde y cmo usarlos. Las farmacias generalmente imprimen las instrucciones del medicamento slo en las cajas y no directamente en los tubos del [place].   Si su medicamento es muy caro, por favor, pngase en contacto con Zigmund Daniel llamando al [phone] y presione la opcin 4 o envenos un mensaje a travs de Pharmacist, community.   No podemos decirle cul ser su copago por los medicamentos por adelantado ya que esto es diferente dependiendo de la cobertura de su seguro. Sin embargo, es posible que podamos encontrar un medicamento sustituto a Electrical engineer un formulario para que el seguro cubra el medicamento que se considera necesario.   Si se requiere una autorizacin previa para que su compaa de seguros [place] su medicamento, por favor permtanos de 1 a 2 das hbiles para completar este proceso.  Los precios de los medicamentos varan con frecuencia dependiendo del Environmental consultant de dnde se surte la receta y alguna farmacias pueden ofrecer precios ms baratos.  El sitio web www.goodrx.com tiene cupones para medicamentos de Airline pilot. Los precios aqu no tienen en cuenta lo que podra costar con la ayuda del seguro (puede ser ms barato con su seguro), pero el sitio web puede darle el precio si no utiliz Research scientist (physical sciences).  - Puede imprimir el cupn correspondiente y llevarlo con su receta a la farmacia.  - Tambin puede pasar por nuestra oficina durante el horario de atencin regular y Charity fundraiser una tarjeta de cupones de GoodRx.  - Si necesita que su receta se  enve electrnicamente a una farmacia diferente, informe a nuestra oficina a travs de MyChart de [place] o por telfono llamando al [phone] y presione la opcin 4.

## 2021-11-11 ENCOUNTER — Ambulatory Visit: Payer: 59 | Admitting: Podiatry

## 2021-11-17 ENCOUNTER — Encounter: Payer: Self-pay | Admitting: Gastroenterology

## 2021-11-17 ENCOUNTER — Ambulatory Visit: Payer: 59 | Admitting: Nurse Practitioner

## 2021-11-18 ENCOUNTER — Ambulatory Visit: Payer: 59 | Admitting: Anesthesiology

## 2021-11-18 ENCOUNTER — Encounter: Payer: Self-pay | Admitting: Gastroenterology

## 2021-11-18 ENCOUNTER — Ambulatory Visit
Admission: RE | Admit: 2021-11-18 | Discharge: 2021-11-18 | Disposition: A | Discharge status: Home / Self Care | Payer: 59 | Attending: Gastroenterology | Admitting: Gastroenterology

## 2021-11-18 ENCOUNTER — Other Ambulatory Visit: Payer: Self-pay

## 2021-11-18 ENCOUNTER — Encounter
Admission: RE | Disposition: A | Discharge status: Home / Self Care | Payer: Self-pay | Source: Home / Self Care | Attending: Gastroenterology

## 2021-11-18 DIAGNOSIS — D124 Benign neoplasm of descending colon: Secondary | ICD-10-CM | POA: Diagnosis not present

## 2021-11-18 DIAGNOSIS — I10 Essential (primary) hypertension: Secondary | ICD-10-CM | POA: Insufficient documentation

## 2021-11-18 DIAGNOSIS — E785 Hyperlipidemia, unspecified: Secondary | ICD-10-CM | POA: Diagnosis not present

## 2021-11-18 DIAGNOSIS — Z1211 Encounter for screening for malignant neoplasm of colon: Secondary | ICD-10-CM | POA: Diagnosis present

## 2021-11-18 DIAGNOSIS — Z79899 Other long term (current) drug therapy: Secondary | ICD-10-CM | POA: Diagnosis not present

## 2021-11-18 DIAGNOSIS — K635 Polyp of colon: Secondary | ICD-10-CM

## 2021-11-18 DIAGNOSIS — J449 Chronic obstructive pulmonary disease, unspecified: Secondary | ICD-10-CM | POA: Diagnosis not present

## 2021-11-18 HISTORY — DX: Hyperlipidemia, unspecified: E78.5

## 2021-11-18 HISTORY — DX: Essential (primary) hypertension: I10

## 2021-11-18 HISTORY — PX: COLONOSCOPY WITH PROPOFOL: SHX5780

## 2021-11-18 LAB — POCT PREGNANCY, URINE: Preg Test, Ur: NEGATIVE

## 2021-11-18 SURGERY — COLONOSCOPY WITH PROPOFOL
Anesthesia: General

## 2021-11-18 MED ORDER — SODIUM CHLORIDE 0.9 % IV SOLN: INTRAVENOUS | Status: DC

## 2021-11-18 MED ORDER — LIDOCAINE HCL (CARDIAC) PF 100 MG/5ML IV SOSY
PREFILLED_SYRINGE | INTRAVENOUS | Status: DC | PRN
  Administered 2021-11-18: 50 mg via INTRAVENOUS

## 2021-11-18 MED ORDER — MIDAZOLAM HCL 2 MG/2ML IJ SOLN
INTRAMUSCULAR | Status: DC | PRN
  Administered 2021-11-18: 2 mg via INTRAVENOUS

## 2021-11-18 MED ORDER — DEXMEDETOMIDINE (PRECEDEX) IN NS 20 MCG/5ML (4 MCG/ML) IV SYRINGE
PREFILLED_SYRINGE | INTRAVENOUS | Status: DC | PRN
  Administered 2021-11-18: 4 ug via INTRAVENOUS
  Administered 2021-11-18: 8 ug via INTRAVENOUS

## 2021-11-18 MED ORDER — PROPOFOL 10 MG/ML IV BOLUS
INTRAVENOUS | Status: DC | PRN
  Administered 2021-11-18: 30 mg via INTRAVENOUS
  Administered 2021-11-18: 40 mg via INTRAVENOUS
  Administered 2021-11-18: 50 mg via INTRAVENOUS
  Administered 2021-11-18: 20 mg via INTRAVENOUS

## 2021-11-18 MED ORDER — MIDAZOLAM HCL 2 MG/2ML IJ SOLN
INTRAMUSCULAR | Status: AC
  Filled 2021-11-18: qty 2

## 2021-11-18 MED ORDER — PROPOFOL 500 MG/50ML IV EMUL
INTRAVENOUS | Status: DC | PRN
  Administered 2021-11-18: 150 ug/kg/min via INTRAVENOUS

## 2021-11-18 NOTE — H&P (Signed)
Cephas Darby, MD [address]  [place]  [place], [place] [address]  Main: [phone]  Fax: [phone] Pager: [phone]  Primary Care Physician:  Eugenia Pancoast, FNP Primary Gastroenterologist:  Dr. Cephas Darby  Pre-Procedure History & Physical: HPI:  Sandra Montes is a 51 y.o. female is here for an colonoscopy.   Past Medical History:  Diagnosis Date   HLD (hyperlipidemia)    Hypertension     Past Surgical History:  Procedure Laterality Date   [place]    Prior to Admission medications   Medication Sig Start Date End Date Taking? Authorizing Provider  atorvastatin (LIPITOR) 40 MG tablet SMARTSIG:1 Tablet(s) By Mouth Every Evening 07/04/21  Yes Dugal, Tabitha, FNP  B Complex Vitamins (VITAMIN B-COMPLEX) TABS Take 1 tablet by mouth daily.   Yes [provider]  cetirizine (ZYRTEC) 10 MG tablet Take by mouth.   Yes [provider]  Cinnamon 500 MG capsule Take by mouth.   Yes [provider]  cyclobenzaprine (FLEXERIL) 10 MG tablet Take 1 tablet (10 mg total) by mouth 3 (three) times daily as needed for muscle spasms. 07/22/21  Yes Felipa Furnace, DPM  fluticasone (FLONASE) 50 MCG/ACT nasal spray Place 2 sprays into both nostrils daily. 07/04/21  Yes Dugal, Lawerance Bach, FNP  Magnesium Cl-Calcium Carbonate (SLOW MAGNESIUM/CALCIUM PO) Take by mouth.   Yes [provider]  metoprolol succinate (TOPROL-XL) 25 MG 24 hr tablet Take one po qd along with 50 mg ER tablet for total of 75 mg daily 07/04/21  Yes Dugal, Tabitha, FNP  metoprolol succinate (TOPROL-XL) 50 MG 24 hr tablet Take one po qd along with 25 mg ER tablet for total of 75 mg daily 07/04/21  Yes Dugal, Tabitha, FNP  Tiotropium Bromide-Olodaterol (STIOLTO RESPIMAT) 2.5-2.5 MCG/ACT AERS Inhale 2 puffs into the lungs daily. 08/20/21  Yes Hunsucker, Bonna Gains, MD  valsartan-hydrochlorothiazide (DIOVAN-HCT)  320-25 MG tablet Take 1 tablet by mouth daily. 07/04/21  Yes Dugal, Lawerance Bach, FNP  albuterol (VENTOLIN HFA) 108 (90 Base) MCG/ACT inhaler Inhale into the lungs. 05/19/17   [provider]  lidocaine-prilocaine (EMLA) cream Apply topically as needed. 02/18/21   [provider]  lidocaine-prilocaine (EMLA) cream Apply topically. 02/18/21   [provider]  melatonin 1 MG TABS tablet Take by mouth. Patient not taking: Reported on 11/18/2021    [provider]    Allergies as of 10/17/2021 - Review Complete 10/16/2021  Allergen Reaction Noted   Lisinopril Cough 12/21/2018   Morphine Other (See Comments) 04/17/2015   Sulfa antibiotics  04/17/2015    Family History  Problem Relation Age of Onset   Alcohol abuse Mother    Heart attack Mother 71   Alcohol abuse Father    Skin cancer Paternal Grandfather    Asthma Daughter     Social History   Socioeconomic History   Marital status: Divorced    Spouse name: Not on file   Number of children: 3   Years of education: Not on file   Highest education level: Not on file  Occupational History    Employer: [employer]  Tobacco Use   Smoking status: Every Day    Packs/day: 1.50    Years: 37.00    Total pack years: 55.50    Types: Cigarettes   Smokeless tobacco: Never   Tobacco comments:    Pt  state 1-1.5 ppd 08/20/21  Vaping Use   Vaping Use: Never used  Substance and Sexual Activity   Alcohol use: Not Currently    Comment: h/o alcoholism quit 2017   Drug use: Yes    Types: Marijuana    Comment: edibles last time 11/16/2021   Sexual activity: Not Currently    Partners: Male  Other Topics Concern   Not on file  Social History Narrative   Not on file   Social Determinants of Health   Financial Resource Strain: Not on file  Food Insecurity: Not on file  Transportation Needs: Not on file  Physical Activity: Not on file  Stress: Not on file  Social Connections: Not on file  Intimate  Partner Violence: Not on file    Review of Systems: See HPI, otherwise negative ROS  Physical Exam: BP (!) 153/80   Pulse (!) 58   Temp (!) 96.9 F (36.1 C) (Temporal)   Resp 16   Ht '5\' 7"'$  (1.702 m)   Wt 95.3 kg   SpO2 100%   BMI 32.89 kg/m  General:   Alert,  pleasant and cooperative in NAD Head:  Normocephalic and atraumatic. Neck:  Supple; no masses or thyromegaly. Lungs:  Clear throughout to auscultation.    Heart:  Regular rate and rhythm. Abdomen:  Soft, nontender and nondistended. Normal bowel sounds, without guarding, and without rebound.   Neurologic:  Alert and  oriented x4;  grossly normal neurologically.  Impression/Plan: Sandra Montes is here for an colonoscopy to be performed for colon cancer screening  Risks, benefits, limitations, and alternatives regarding  colonoscopy have been reviewed with the patient.  Questions have been answered.  All parties agreeable.   Sherri Sear, MD  11/18/2021, 8:11 AM

## 2021-11-18 NOTE — Op Note (Signed)
[facility] Patient Name: Sandra Montes Procedure Date: 11/18/2021 8:15 AM MRN: [id number] Account #: [id number] Date of Birth: 26-Nov-1970 Admit Type: Outpatient Age: 51 Room: [facility] ENDO ROOM 3 Gender: Female Note Status: Finalized Instrument Name: Jasper Riling [id number] Procedure:             Colonoscopy Indications:           Screening for colorectal malignant neoplasm, This is                         the patient's first colonoscopy Providers:             Lin Landsman MD, MD Referring MD:          No Local Md, MD (Referring MD) Medicines:             General Anesthesia Complications:         No immediate complications. Estimated blood loss: None. Procedure:             Pre-Anesthesia Assessment:                        - Prior to the procedure, a History and Physical was                         performed, and patient medications and allergies were                         reviewed. The patient is competent. The risks and                         benefits of the procedure and the sedation options and                         risks were discussed with the patient. All questions                         were answered and informed consent was obtained.                         Patient identification and proposed procedure were                         verified by the physician, the nurse, the                         anesthesiologist, the anesthetist and the technician                         in the pre-procedure area in the procedure room in the                         endoscopy suite. Mental Status Examination: alert and                         oriented. Airway Examination: normal oropharyngeal                         airway and neck mobility. Respiratory Examination:  clear to auscultation. CV Examination: normal.                         Prophylactic Antibiotics: The patient does not require                          prophylactic antibiotics. Prior Anticoagulants: The                         patient has taken no previous anticoagulant or                         antiplatelet agents. ASA Grade Assessment: II - A                         patient with mild systemic disease. After reviewing                         the risks and benefits, the patient was deemed in                         satisfactory condition to undergo the procedure. The                         anesthesia plan was to use general anesthesia.                         Immediately prior to administration of medications,                         the patient was re-assessed for adequacy to receive                         sedatives. The heart rate, respiratory rate, oxygen                         saturations, blood pressure, adequacy of pulmonary                         ventilation, and response to care were monitored                         throughout the procedure. The physical status of the                         patient was re-assessed after the procedure.                        After obtaining informed consent, the colonoscope was                         passed under direct vision. Throughout the procedure,                         the patient's blood pressure, pulse, and oxygen                         saturations were monitored continuously. The  Colonoscope was introduced through the anus and                         advanced to the 10 cm into the ileum. The colonoscopy                         was performed without difficulty. The patient                         tolerated the procedure well. The quality of the bowel                         preparation was evaluated using the BBPS [facility] Bowel                         Preparation Scale) with scores of: Right Colon = 3,                         Transverse Colon = 3 and Left Colon = 3 (entire mucosa                         seen well with no residual staining, small fragments                          of stool or opaque liquid). The total BBPS score                         equals 9. Findings:      The perianal and digital rectal examinations were normal. Pertinent       negatives include normal sphincter tone and no palpable rectal lesions.      The terminal ileum appeared normal.      Three sessile polyps were found in the recto-sigmoid colon, sigmoid       colon and descending colon. The polyps were 3 to 5 mm in size. These       polyps were removed with a cold snare. Resection and retrieval were       complete.      A diminutive polyp was found in the descending colon. The polyp was       sessile. The polyp was removed with a cold biopsy forceps. Resection and       retrieval were complete.      Normal mucosa was found in the entire colon. Biopsies for histology were       taken with a cold forceps from the entire colon for evaluation of       microscopic colitis.      The retroflexed view of the distal rectum and anal verge was normal and       showed no anal or rectal abnormalities. Impression:            - The examined portion of the ileum was normal.                        - Three 3 to 5 mm polyps at the recto-sigmoid colon,                         in the sigmoid colon and in the descending  colon,                         removed with a cold snare. Resected and retrieved.                        - One diminutive polyp in the descending colon,                         removed with a cold biopsy forceps. Resected and                         retrieved.                        - Normal mucosa in the entire examined colon. Biopsied.                        - The distal rectum and anal verge are normal on                         retroflexion view. Recommendation:        - Discharge patient to home (with escort).                        - Resume previous diet today.                        - Continue present medications.                        - Await pathology  results.                        - Repeat colonoscopy in 3 - 5 years for surveillance                         based on pathology results. Procedure Code(s):     --- Professional ---                        [phone], Colonoscopy, flexible; with removal of                         tumor(s), polyp(s), or other lesion(s) by snare                         technique                        45380, 68, Colonoscopy, flexible; with biopsy, single                         or multiple Diagnosis Code(s):     --- Professional ---                        K63.5, Polyp of colon                        Z12.11, Encounter for screening for malignant neoplasm  of colon CPT copyright 2019 American Medical Association. All rights reserved. The codes documented in this report are preliminary and upon coder review may  be revised to meet current compliance requirements. Dr. Ulyess Mort Lin Landsman MD, MD 11/18/2021 9:11:49 AM This report has been signed electronically. Number of Addenda: 0 Note Initiated On: 11/18/2021 8:15 AM Scope Withdrawal Time: 0 hours 23 minutes 59 seconds  Total Procedure Duration: 0 hours 33 minutes 12 seconds  Estimated Blood Loss:  Estimated blood loss: none.      [facility]

## 2021-11-18 NOTE — Transfer of Care (Signed)
Immediate Anesthesia Transfer of Care Note  Patient: Sandra Montes  Procedure(s) Performed: COLONOSCOPY WITH PROPOFOL  Patient Location: PACU and Endoscopy Unit  Anesthesia Type:General  Level of Consciousness: drowsy and patient cooperative  Airway & Oxygen Therapy: Patient Spontanous Breathing  Post-op Assessment: Report given to RN and Post -op Vital signs reviewed and stable  Post vital signs: Reviewed and stable  Last Vitals:  Vitals Value Taken Time  BP    Temp    Pulse    Resp    SpO2      Last Pain:  Vitals:   11/18/21 0736  TempSrc: Temporal  PainSc: 0-No pain         Complications: No notable events documented.

## 2021-11-18 NOTE — Anesthesia Preprocedure Evaluation (Addendum)
Anesthesia Evaluation  Patient identified by MRN, date of birth, ID band Patient awake    Reviewed: Allergy & Precautions, NPO status , Patient's Chart, lab work & pertinent test results, reviewed documented beta blocker date and time   Airway Mallampati: II  TM Distance: >3 FB Neck ROM: full    Dental  (+) Upper Dentures   Pulmonary COPD,  COPD inhaler, Current SmokerPatient did not abstain from smoking.,    Pulmonary exam normal        Cardiovascular hypertension, Pt. on medications and Pt. on home beta blockers Normal cardiovascular exam     Neuro/Psych negative neurological ROS  negative psych ROS   GI/Hepatic negative GI ROS, Neg liver ROS,   Endo/Other  negative endocrine ROS  Renal/GU negative Renal ROS  negative genitourinary   Musculoskeletal   Abdominal Normal abdominal exam  (+)   Peds  Hematology negative hematology ROS (+)   Anesthesia Other Findings Past Medical History: No date: HLD (hyperlipidemia) No date: Hypertension  Past Surgical History: No date: CESAREAN SECTION     Comment:  1993 No date: CHOLECYSTECTOMY     Comment:  1996 No date: TUBAL LIGATION     Comment:  1995     Reproductive/Obstetrics negative OB ROS                            Anesthesia Physical Anesthesia Plan  ASA: 2  Anesthesia Plan: General   Post-op Pain Management:    Induction: Intravenous  PONV Risk Score and Plan: Propofol infusion and TIVA  Airway Management Planned: Natural Airway  Additional Equipment:   Intra-op Plan:   Post-operative Plan:   Informed Consent: I have reviewed the patients History and Physical, chart, labs and discussed the procedure including the risks, benefits and alternatives for the proposed anesthesia with the patient or authorized representative who has indicated his/her understanding and acceptance.     Dental Advisory Given  Plan Discussed  with: Anesthesiologist, CRNA and Surgeon  Anesthesia Plan Comments:        Anesthesia Quick Evaluation

## 2021-11-18 NOTE — Anesthesia Procedure Notes (Signed)
Procedure Name: MAC Date/Time: 11/18/2021 8:27 AM  Performed by: Jerrye Noble, CRNAPre-anesthesia Checklist: Patient identified, Emergency Drugs available, Suction available and Patient being monitored Patient Re-evaluated:Patient Re-evaluated prior to induction Oxygen Delivery Method: Nasal cannula

## 2021-11-18 NOTE — Anesthesia Postprocedure Evaluation (Signed)
Anesthesia Post Note  Patient: Sandra Montes  Procedure(s) Performed: COLONOSCOPY WITH PROPOFOL  Patient location during evaluation: Endoscopy Anesthesia Type: General Level of consciousness: awake and alert Pain management: pain level controlled Vital Signs Assessment: post-procedure vital signs reviewed and stable Respiratory status: spontaneous breathing, nonlabored ventilation and respiratory function stable Cardiovascular status: blood pressure returned to baseline and stable Postop Assessment: no apparent nausea or vomiting Anesthetic complications: no   No notable events documented.   Last Vitals:  Vitals:   11/18/21 0924 11/18/21 0934  BP: (!) 95/57 (!) 131/98  Pulse: 64 (!) 54  Resp: 17 14  Temp: (!) 35.6 C   SpO2: 99% 99%    Last Pain:  Vitals:   11/18/21 0934  TempSrc:   PainSc: 0-No pain                 [place] Ouch

## 2021-11-19 ENCOUNTER — Encounter: Payer: Self-pay | Admitting: Gastroenterology

## 2021-11-19 LAB — SURGICAL PATHOLOGY

## 2021-11-21 ENCOUNTER — Encounter: Payer: Self-pay | Admitting: Gastroenterology

## 2021-11-26 ENCOUNTER — Encounter: Payer: Self-pay | Admitting: Pulmonary Disease

## 2021-11-26 ENCOUNTER — Ambulatory Visit (INDEPENDENT_AMBULATORY_CARE_PROVIDER_SITE_OTHER): Payer: 59 | Admitting: Pulmonary Disease

## 2021-11-26 ENCOUNTER — Ambulatory Visit: Payer: 59 | Admitting: Pulmonary Disease

## 2021-11-26 VITALS — BP 126/68 | HR 65 | Temp 98.2°F | Ht 67.0 in | Wt 211.0 lb

## 2021-11-26 DIAGNOSIS — J432 Centrilobular emphysema: Secondary | ICD-10-CM

## 2021-11-26 LAB — PULMONARY FUNCTION TEST
DL/VA % pred: 95 %
DL/VA: 4.01 ml/min/mmHg/L
DLCO cor % pred: 76 %
DLCO cor: 17.75 ml/min/mmHg
DLCO unc % pred: 76 %
DLCO unc: 17.75 ml/min/mmHg
FEF 25-75 Post: 1.99 L/s
FEF 25-75 Pre: 2.23 L/s
FEF2575-%Change-Post: -10 %
FEF2575-%Pred-Post: 68 %
FEF2575-%Pred-Pre: 76 %
FEV1-%Change-Post: -3 %
FEV1-%Pred-Post: 72 %
FEV1-%Pred-Pre: 75 %
FEV1-Post: 2.23 L
FEV1-Pre: 2.31 L
FEV1FVC-%Change-Post: 0 %
FEV1FVC-%Pred-Pre: 102 %
FEV6-%Change-Post: -3 %
FEV6-%Pred-Post: 72 %
FEV6-%Pred-Pre: 74 %
FEV6-Post: 2.73 L
FEV6-Pre: 2.83 L
FEV6FVC-%Pred-Post: 102 %
FEV6FVC-%Pred-Pre: 102 %
FVC-%Change-Post: -3 %
FVC-%Pred-Post: 70 %
FVC-%Pred-Pre: 72 %
FVC-Post: 2.73 L
FVC-Pre: 2.83 L
Post FEV1/FVC ratio: 82 %
Post FEV6/FVC ratio: 100 %
Pre FEV1/FVC ratio: 82 %
Pre FEV6/FVC Ratio: 100 %
RV % pred: 93 %
RV: 1.83 L
TLC % pred: 86 %
TLC: 4.75 L

## 2021-11-26 MED ORDER — STIOLTO RESPIMAT 2.5-2.5 MCG/ACT IN AERS: 2.0000 | INHALATION_SPRAY | Freq: Every day | RESPIRATORY_TRACT | 11 refills | Status: DC

## 2021-11-26 MED ORDER — ALBUTEROL SULFATE HFA 108 (90 BASE) MCG/ACT IN AERS: 2.0000 | INHALATION_SPRAY | RESPIRATORY_TRACT | 6 refills | Status: DC | PRN

## 2021-11-26 NOTE — Patient Instructions (Signed)
Nice to see you again  Your pulmonary function tests are largely normal which is reassuring  I am glad the Stiolto is helped with the wheeze, we will continue this, this was refilled today along with your albuterol.  Your CT lung cancer screening scan in May looked fine.  There will be a repeat scan next year around May 2024.  Return to clinic in 1 year or sooner as needed with Dr. Silas Flood

## 2021-11-26 NOTE — Progress Notes (Signed)
$'@Patient'u$  ID: Sandra Montes, female    DOB: 1970-07-23, 51 y.o.   MRN: [id number]  Chief Complaint  Patient presents with   Follow-up    Pt is here for follow up for centrilobular emphysema. Pt states that she is doing well. Pt had full pfts done today. Pt is on Stiolto daily and Albuterol as needed. Pt states that the inhalers are working well for her     Referring provider: Eugenia Pancoast, FNP  HPI:   51 y.o. woman whom are seen in follow up for evaluation of emphysema.  Most recent PCP note reviewed.  Overall she is doing quite well.  Stiolto prescribed at last visit.  This helped with the wheeze and shortness of breath.  She is pleased with this.  PFTs were performed today.  Please see below for full interpretation but on review this demonstrates no COPD, mildly decreased DLCO.  Overall relatively normal.  HPI at initial visit: Patient with chief complaint of dyspnea on exertion.  Worse on inclines or stairs.  No time of day when things are better or worse.  No position make things better or worse.  No seasonal environmental factors she can identify to make things better or worse.  She was placed on Spiriva.  Does not think it helped.  No other relieving or exacerbating factors.  Reviewed most recent chest imaging CT lung cancer screening 06/2020 that on my review interpretation reveals clear lungs with emphysematous changes.  PMH: Tobacco abuse, emphysema, hyperlipidemia, seasonal allergies, hypertension Surgical history: C-section, cholecystectomy, tubal ligation Family history: Mother with CAD, father with alcohol abuse Social history: Current smoker, approximately Web designer, lives in [place] / Pulmonary Flowsheets:   ACT:      No data to display           MMRC:     No data to display           Epworth:      No data to display           Tests:   FENO:  No results found for: "NITRICOXIDE"  PFT:    Latest Ref Rng &  Units 11/26/2021    9:58 AM  PFT Results  FVC-Pre L 2.83  P  FVC-Predicted Pre % 72  P  FVC-Post L 2.73  P  FVC-Predicted Post % 70  P  Pre FEV1/FVC % % 82  P  Post FEV1/FCV % % 82  P  FEV1-Pre L 2.31  P  FEV1-Predicted Pre % 75  P  FEV1-Post L 2.23  P  DLCO uncorrected ml/min/mmHg 17.75  P  DLCO UNC% % 76  P  DLCO corrected ml/min/mmHg 17.75  P  DLCO COR %Predicted % 76  P  DLVA Predicted % 95  P  TLC L 4.75  P  TLC % Predicted % 86  P  RV % Predicted % 93  P    P Preliminary result   Personally reviewed and interpreted spirometry suggestive of mild restriction versus air trapping.  TLC within normal limits, no restriction.  No evidence of air trapping on lung volumes.  ERV is very low.  DLCO is mildly reduced just below the lower limit of normal.  WALK:      No data to display           Imaging: Personally reviewed and as per EMR discussion this note  Lab Results: Personally reviewed, notably polycythemia present CBC    Component Value Date/Time  WBC 9.4 07/04/2021 1510   RBC 5.49 (H) 07/04/2021 1510   HGB 17.2 (H) 07/04/2021 1510   HCT 49.8 (H) 07/04/2021 1510   PLT 230 07/04/2021 1510   MCV 90.7 07/04/2021 1510   MCH 31.3 07/04/2021 1510   MCHC 34.5 07/04/2021 1510   RDW 12.5 07/04/2021 1510    BMET    Component Value Date/Time   NA 139 07/04/2021 1510   K 4.0 07/04/2021 1510   CL 98 07/04/2021 1510   CO2 31 07/04/2021 1510   GLUCOSE 96 07/04/2021 1510   BUN 12 07/04/2021 1510   CREATININE 0.65 07/04/2021 1510   CALCIUM 9.2 07/04/2021 1510    BNP No results found for: "BNP"  ProBNP No results found for: "PROBNP"  Specialty Problems       Pulmonary Problems   Centrilobular emphysema (HCC)   Non-seasonal allergic rhinitis due to pollen    Allergies  Allergen Reactions   Lisinopril Cough   Morphine Other (See Comments)    "tried to take her face off due to itching" Other reaction(s): Other (See Comments) "tried to take her face  off due to itching"    Sulfa Antibiotics     Other reaction(s): Other (See Comments) Pass out Other reaction(s): Other (See Comments) Pass out     Immunization History  Administered Date(s) Administered   Tdap 10/17/2014    Past Medical History:  Diagnosis Date   HLD (hyperlipidemia)    Hypertension     Tobacco History: Social History   Tobacco Use  Smoking Status Every Day   Packs/day: 1.50   Years: 37.00   Total pack years: 55.50   Types: Cigarettes  Smokeless Tobacco Never  Tobacco Comments   Pt state 1-1.5 ppd 08/20/21   Ready to quit: Not Answered Counseling given: Not Answered Tobacco comments: Pt state 1-1.5 ppd 08/20/21   Continue to not smoke  Outpatient Encounter Medications as of 11/26/2021  Medication Sig   atorvastatin (LIPITOR) 40 MG tablet SMARTSIG:1 Tablet(s) By Mouth Every Evening   B Complex Vitamins (VITAMIN B-COMPLEX) TABS Take 1 tablet by mouth daily.   cetirizine (ZYRTEC) 10 MG tablet Take by mouth.   Cinnamon 500 MG capsule Take by mouth.   cyclobenzaprine (FLEXERIL) 10 MG tablet Take 1 tablet (10 mg total) by mouth 3 (three) times daily as needed for muscle spasms.   fluticasone (FLONASE) 50 MCG/ACT nasal spray Place 2 sprays into both nostrils daily.   lidocaine-prilocaine (EMLA) cream Apply topically as needed.   lidocaine-prilocaine (EMLA) cream Apply topically.   Magnesium Cl-Calcium Carbonate (SLOW MAGNESIUM/CALCIUM PO) Take by mouth.   metoprolol succinate (TOPROL-XL) 25 MG 24 hr tablet Take one po qd along with 50 mg ER tablet for total of 75 mg daily   metoprolol succinate (TOPROL-XL) 50 MG 24 hr tablet Take one po qd along with 25 mg ER tablet for total of 75 mg daily   valsartan-hydrochlorothiazide (DIOVAN-HCT) 320-25 MG tablet Take 1 tablet by mouth daily.   [DISCONTINUED] albuterol (VENTOLIN HFA) 108 (90 Base) MCG/ACT inhaler Inhale into the lungs.   [DISCONTINUED] Tiotropium Bromide-Olodaterol (STIOLTO RESPIMAT) 2.5-2.5 MCG/ACT  AERS Inhale 2 puffs into the lungs daily.   albuterol (VENTOLIN HFA) 108 (90 Base) MCG/ACT inhaler Inhale 2 puffs into the lungs every 4 (four) hours as needed for wheezing or shortness of breath.   Tiotropium Bromide-Olodaterol (STIOLTO RESPIMAT) 2.5-2.5 MCG/ACT AERS Inhale 2 puffs into the lungs daily.   No facility-administered encounter medications on file as of 11/26/2021.  Review of Systems  Review of Systems  N/a Physical Exam  BP 126/68 (BP Location: Left Arm, Patient Position: Sitting, Cuff Size: Normal)   Pulse 65   Temp 98.2 F (36.8 C) (Oral)   Ht '5\' 7"'$  (1.702 m)   Wt 211 lb (95.7 kg)   SpO2 96%   BMI 33.05 kg/m   Wt Readings from Last 5 Encounters:  11/26/21 211 lb (95.7 kg)  11/18/21 210 lb (95.3 kg)  10/13/21 208 lb 4 oz (94.5 kg)  08/20/21 205 lb (93 kg)  07/04/21 205 lb (93 kg)    BMI Readings from Last 5 Encounters:  11/26/21 33.05 kg/m  11/18/21 32.89 kg/m  10/13/21 32.62 kg/m  08/20/21 32.11 kg/m  07/04/21 32.59 kg/m     Physical Exam General: Well-appearing, no acute distress Eyes: EOMI, no icterus Neck: Supple, no JVP Pulmonary: Distant, clear Cardiovascular: Regular in rhythm, no murmur Abdomen: Nondistended, bowel sounds present MSK: No synovitis, no joint effusion Neuro: Normal gait, no weakness Psych: Normal mood, full affect   Assessment & Plan:   Emphysema: As demonstrated on CT scan.  Sequela of tobacco abuse, cigarette smoking.  Dyspnea on exertion: Suspect related to emphysema.  PFTs largely normal without fixed obstruction or significant abnormality on lung volumes.  Greatly improved with Stiolto.  Refilled today.  Tobacco abuse: Follows with a lung cancer screening program, Eric Form, NP.  CT scan 09/2021 lung RADS 1.  Continue yearly surveillance.  Mildly reduced DLCO: Most likely sequela of emphysema seen on CT scan.   Return in about 1 year (around 11/27/2022).   Lanier Clam, MD 11/26/2021

## 2021-11-26 NOTE — Patient Instructions (Signed)
Full PFT performed today. °

## 2021-11-26 NOTE — Progress Notes (Signed)
Full PFT Performed Today  

## 2021-12-08 ENCOUNTER — Ambulatory Visit: Payer: 59 | Admitting: Dermatology

## 2021-12-31 ENCOUNTER — Other Ambulatory Visit: Payer: Self-pay

## 2021-12-31 DIAGNOSIS — I10 Essential (primary) hypertension: Secondary | ICD-10-CM

## 2022-01-01 MED ORDER — VALSARTAN-HYDROCHLOROTHIAZIDE 320-25 MG PO TABS: 1.0000 | ORAL_TABLET | Freq: Every day | ORAL | 1 refills | Status: DC

## 2022-01-01 MED ORDER — METOPROLOL SUCCINATE ER 50 MG PO TB24: ORAL_TABLET | ORAL | 1 refills | Status: DC

## 2022-01-01 MED ORDER — METOPROLOL SUCCINATE ER 25 MG PO TB24: ORAL_TABLET | ORAL | 1 refills | Status: DC

## 2022-01-13 ENCOUNTER — Ambulatory Visit: Payer: 59 | Admitting: Family

## 2022-01-13 ENCOUNTER — Encounter: Payer: Self-pay | Admitting: Family

## 2022-01-13 ENCOUNTER — Other Ambulatory Visit: Payer: Self-pay | Admitting: Family

## 2022-01-13 VITALS — BP 132/78 | HR 69 | Temp 98.3°F | Resp 16 | Ht 67.0 in | Wt 206.1 lb

## 2022-01-13 DIAGNOSIS — Z860100 Personal history of colon polyps, unspecified: Secondary | ICD-10-CM | POA: Insufficient documentation

## 2022-01-13 DIAGNOSIS — Z720 Tobacco use: Secondary | ICD-10-CM

## 2022-01-13 DIAGNOSIS — N951 Menopausal and female climacteric states: Secondary | ICD-10-CM | POA: Diagnosis not present

## 2022-01-13 DIAGNOSIS — R5382 Chronic fatigue, unspecified: Secondary | ICD-10-CM

## 2022-01-13 DIAGNOSIS — J301 Allergic rhinitis due to pollen: Secondary | ICD-10-CM

## 2022-01-13 DIAGNOSIS — D582 Other hemoglobinopathies: Secondary | ICD-10-CM | POA: Diagnosis not present

## 2022-01-13 DIAGNOSIS — R051 Acute cough: Secondary | ICD-10-CM | POA: Insufficient documentation

## 2022-01-13 DIAGNOSIS — E785 Hyperlipidemia, unspecified: Secondary | ICD-10-CM

## 2022-01-13 DIAGNOSIS — F411 Generalized anxiety disorder: Secondary | ICD-10-CM

## 2022-01-13 LAB — FOLLICLE STIMULATING HORMONE: FSH: 75.4 m[IU]/mL

## 2022-01-13 LAB — T3, FREE: T3, Free: 3.2 pg/mL (ref 2.3–4.2)

## 2022-01-13 LAB — LIPID PANEL
Cholesterol: 137 mg/dL (ref 0–200)
HDL: 49.4 mg/dL (ref >39.00)
LDL Cholesterol: 69 mg/dL (ref 0–99)
NonHDL: 87.96
Total CHOL/HDL Ratio: 3
Triglycerides: 95 mg/dL (ref 0.0–149.0)
VLDL: 19 mg/dL (ref 0.0–40.0)

## 2022-01-13 LAB — CBC
HCT: 48.4 % — ABNORMAL HIGH (ref 36.0–46.0)
Hemoglobin: 17 g/dL — ABNORMAL HIGH (ref 12.0–15.0)
MCHC: 35.1 g/dL (ref 30.0–36.0)
MCV: 90.3 fl (ref 78.0–100.0)
Platelets: 201 10*3/uL (ref 150.0–400.0)
RBC: 5.36 Mil/uL — ABNORMAL HIGH (ref 3.87–5.11)
RDW: 13 % (ref 11.5–15.5)
WBC: 8.2 10*3/uL (ref 4.0–10.5)

## 2022-01-13 LAB — T4, FREE: Free T4: 0.97 ng/dL (ref 0.60–1.60)

## 2022-01-13 LAB — TSH: TSH: 1.9 u[IU]/mL (ref 0.35–5.50)

## 2022-01-13 LAB — B12 AND FOLATE PANEL
Folate: >23.9 ng/mL (ref >5.9)
Vitamin B-12: 583 pg/mL (ref 211–911)

## 2022-01-13 MED ORDER — BENZONATATE 200 MG PO CAPS: 200.0000 mg | ORAL_CAPSULE | Freq: Two times a day (BID) | ORAL | 0 refills | Status: DC | PRN

## 2022-01-13 MED ORDER — HYDROXYZINE PAMOATE 50 MG PO CAPS: 50.0000 mg | ORAL_CAPSULE | Freq: Three times a day (TID) | ORAL | 0 refills | Status: DC | PRN

## 2022-01-13 MED ORDER — AZELASTINE HCL 0.1 % NA SOLN: 1.0000 | Freq: Two times a day (BID) | NASAL | 12 refills | Status: DC

## 2022-01-13 NOTE — Patient Instructions (Addendum)
Stop by the lab prior to leaving today. I will notify you of your results once received.   Start astelin and continue with flonase.   A referral was placed today for psychology.  Please let us know if you have not heard back within 2 weeks about the referral.   Regards,   Female Iafrate FNP-C

## 2022-01-13 NOTE — Assessment & Plan Note (Signed)
dicussed options.  rx hydroxyxine prn anxiety state (50 MG)  Also referral placed for psychology for work on anxiety reducing techniques

## 2022-01-13 NOTE — Assessment & Plan Note (Signed)
Ordered lipid panel, pending results. Work on low cholesterol diet and exercise as tolerated Continue atorvastatin 40 mg once daily

## 2022-01-13 NOTE — Assessment & Plan Note (Signed)
Repeat cbc pending results Pt is a smoker

## 2022-01-13 NOTE — Assessment & Plan Note (Signed)
Discussed options.  paroxitine will interact with metoprolol so not an option.  Consider alternatives, hormone not a good option due to smoking however will test labs today.  tsh fsh and prolactin pending Pt request full thyroid panel, advised may not be covered pt states she is ok with this.

## 2022-01-13 NOTE — Progress Notes (Signed)
Established Patient Office Visit  Subjective:  Patient ID: Sandra Montes, female    DOB: [birth year]  Age: 51 y.o. MRN: [id number]  CC:  Chief Complaint  Patient presents with   Hyperlipidemia    HPI Sandra Montes is here today for follow up.   Smoking: trying ot quit smoking. Paid [phone] for an app on her phone, to hypnotize her to quit smoking. App is called [employer]ito. Chantix 'made her crazy' wellbutrin made her a 'happy smoking'.   CT low dose lung screen, stable, will be annual.  Colonoscopy 11/18/21, benign polyps , every five years .  Hyperlipidemia: currently on lipitor 40 mg once daily, tolerating well. Did start taking fish oils.  Lab Results  Component Value Date   CHOL 191 07/04/2021   HDL 52 07/04/2021   LDLCALC 100 (H) 07/04/2021   TRIG 272 (H) 07/04/2021   CHOLHDL 3.7 07/04/2021   Recently elevated hgn and hct, will check for b12, pt also a smoker which may contribute. Due for repeat today.  Lab Results  Component Value Date   WBC 9.4 07/04/2021   HGB 17.2 (H) 07/04/2021   HCT 49.8 (H) 07/04/2021   MCV 90.7 07/04/2021   PLT 230 07/04/2021   Back to having anxiety attacks, has been during driving, three during driving in the last one month. In the past took mental health drugs. She has been on wellbutrin, pristiq, has used alprazolam prn, also has tried paxil and lexapro (didn't like how it made her feel).    Going through menopause.   Spotting 45 days ago, however prior to that lmp was around February.    Wt Readings from Last 3 Encounters:  01/13/22 206 lb 2 oz (93.5 kg)  11/26/21 211 lb (95.7 kg)  11/18/21 210 lb (95.3 kg)      Past Medical History:  Diagnosis Date   HLD (hyperlipidemia)    Hypertension     Past Surgical History:  Procedure Laterality Date   [place]   COLONOSCOPY WITH PROPOFOL N/A 11/18/2021   Procedure: COLONOSCOPY WITH PROPOFOL;  Surgeon: Lin Landsman, MD;   Location: [facility] ENDOSCOPY;  Service: Gastroenterology;  Laterality: N/A;   TUBAL LIGATION     1995    Family History  Problem Relation Age of Onset   Alcohol abuse Mother    Heart attack Mother 67   Alcohol abuse Father    Skin cancer Paternal Grandfather    Asthma Daughter     Social History   Socioeconomic History   Marital status: Divorced    Spouse name: Not on file   Number of children: 3   Years of education: Not on file   Highest education level: Not on file  Occupational History    Employer: [employer]  Tobacco Use   Smoking status: Every Day    Packs/day: 1.50    Years: 37.00    Total pack years: 55.50    Types: Cigarettes   Smokeless tobacco: Never   Tobacco comments:    Pt state 1-1.5 ppd 08/20/21  Vaping Use   Vaping Use: Never used  Substance and Sexual Activity   Alcohol use: Not Currently    Comment: h/o alcoholism quit 2017   Drug use: Yes    Types: Marijuana    Comment: edibles last time 11/16/2021   Sexual activity: Not Currently    Partners: Male  Other Topics Concern   Not  on file  Social History Narrative   Not on file   Social Determinants of Health   Financial Resource Strain: Not on file  Food Insecurity: Not on file  Transportation Needs: Not on file  Physical Activity: Not on file  Stress: Not on file  Social Connections: Not on file  Intimate Partner Violence: Not on file    Outpatient Medications Prior to Visit  Medication Sig Dispense Refill   albuterol (VENTOLIN HFA) 108 (90 Base) MCG/ACT inhaler Inhale 2 puffs into the lungs every 4 (four) hours as needed for wheezing or shortness of breath. 18 g 6   atorvastatin (LIPITOR) 40 MG tablet SMARTSIG:1 Tablet(s) By Mouth Every Evening 90 tablet 1   B Complex Vitamins (VITAMIN B-COMPLEX) TABS Take 1 tablet by mouth daily.     cetirizine (ZYRTEC) 10 MG tablet Take by mouth.     Cinnamon 500 MG capsule Take by mouth.     cyclobenzaprine (FLEXERIL) 10 MG tablet Take 1 tablet  (10 mg total) by mouth 3 (three) times daily as needed for muscle spasms. 60 tablet 0   fluticasone (FLONASE) 50 MCG/ACT nasal spray Place 2 sprays into both nostrils daily. 16 g 6   lidocaine-prilocaine (EMLA) cream Apply topically as needed.     Magnesium Cl-Calcium Carbonate (SLOW MAGNESIUM/CALCIUM PO) Take by mouth.     metoprolol succinate (TOPROL-XL) 25 MG 24 hr tablet Take one po qd along with 50 mg ER tablet for total of 75 mg daily 90 tablet 1   metoprolol succinate (TOPROL-XL) 50 MG 24 hr tablet Take one po qd along with 25 mg ER tablet for total of 75 mg daily 90 tablet 1   Omega-3 Fatty Acids (FISH OIL) 1000 MG CAPS Take by mouth.     Tiotropium Bromide-Olodaterol (STIOLTO RESPIMAT) 2.5-2.5 MCG/ACT AERS Inhale 2 puffs into the lungs daily. 1 each 11   valsartan-hydrochlorothiazide (DIOVAN-HCT) 320-25 MG tablet Take 1 tablet by mouth daily. 90 tablet 1   lidocaine-prilocaine (EMLA) cream Apply topically.     No facility-administered medications prior to visit.    Allergies  Allergen Reactions   Buspar [Buspirone] Other (See Comments)    Optical migraines   Lisinopril Cough   Morphine Other (See Comments)    "tried to take her face off due to itching" Other reaction(s): Other (See Comments) "tried to take her face off due to itching"    Sulfa Antibiotics     Other reaction(s): Other (See Comments) Pass out Other reaction(s): Other (See Comments) Pass out           Objective:    Physical Exam Vitals reviewed.  Constitutional:      General: She is not in acute distress.    Appearance: Normal appearance. She is obese. She is not ill-appearing or toxic-appearing.  HENT:     Right Ear: Tympanic membrane normal.     Left Ear: Tympanic membrane normal.     Nose: Mucosal edema and congestion present.     Right Turbinates: Swollen.     Left Turbinates: Swollen.     Mouth/Throat:     Mouth: Mucous membranes are moist.     Pharynx: Posterior oropharyngeal erythema  (with cobblestoning) present.     Tonsils: No tonsillar exudate.  Eyes:     Extraocular Movements: Extraocular movements intact.     Conjunctiva/sclera: Conjunctivae normal.     Pupils: Pupils are equal, round, and reactive to light.  Neck:     Thyroid: No thyroid mass.  Cardiovascular:     Rate and Rhythm: Normal rate and regular rhythm.  Pulmonary:     Effort: Pulmonary effort is normal.     Breath sounds: Normal breath sounds.  Abdominal:     General: Abdomen is flat. Bowel sounds are normal.     Palpations: Abdomen is soft.  Musculoskeletal:        General: Normal range of motion.  Lymphadenopathy:     Cervical:     Right cervical: No superficial cervical adenopathy.    Left cervical: No superficial cervical adenopathy.  Skin:    General: Skin is warm.     Capillary Refill: Capillary refill takes less than 2 seconds.  Neurological:     General: No focal deficit present.     Mental Status: She is alert and oriented to person, place, and time.  Psychiatric:        Mood and Affect: Mood normal.        Behavior: Behavior normal.        Thought Content: Thought content normal.        Judgment: Judgment normal.       BP 132/78   Pulse 69   Temp 98.3 F (36.8 C)   Resp 16   Ht '5\' 7"'$  (1.702 m)   Wt 206 lb 2 oz (93.5 kg)   LMP 07/16/2021   SpO2 96%   BMI 32.28 kg/m  Wt Readings from Last 3 Encounters:  01/13/22 206 lb 2 oz (93.5 kg)  11/26/21 211 lb (95.7 kg)  11/18/21 210 lb (95.3 kg)     Health Maintenance Due  Topic Date Due   COVID-19 Vaccine (1) Never done   PAP SMEAR-Modifier  Never done   MAMMOGRAM  Never done   INFLUENZA VACCINE  Never done    There are no preventive care reminders to display for this patient.  No results found for: "TSH" Lab Results  Component Value Date   WBC 9.4 07/04/2021   HGB 17.2 (H) 07/04/2021   HCT 49.8 (H) 07/04/2021   MCV 90.7 07/04/2021   PLT 230 07/04/2021   Lab Results  Component Value Date   NA 139  07/04/2021   K 4.0 07/04/2021   CO2 31 07/04/2021   GLUCOSE 96 07/04/2021   BUN 12 07/04/2021   CREATININE 0.65 07/04/2021   BILITOT 0.6 07/04/2021   AST 16 07/04/2021   ALT 16 07/04/2021   PROT 6.8 07/04/2021   CALCIUM 9.2 07/04/2021   Lab Results  Component Value Date   CHOL 191 07/04/2021   Lab Results  Component Value Date   HDL 52 07/04/2021   Lab Results  Component Value Date   LDLCALC 100 (H) 07/04/2021   Lab Results  Component Value Date   TRIG 272 (H) 07/04/2021   Lab Results  Component Value Date   CHOLHDL 3.7 07/04/2021   Lab Results  Component Value Date   HGBA1C 5.5 07/04/2021      Assessment & Plan:   Problem List Items Addressed This Visit       Cardiovascular and Mediastinum   Hot flushes, perimenopausal    Discussed options.  paroxitine will interact with metoprolol so not an option.  Consider alternatives, hormone not a good option due to smoking however will test labs today.  tsh fsh and prolactin pending Pt request full thyroid panel, advised may not be covered pt states she is ok with this.      Relevant Orders   Follicle stimulating hormone  Prolactin   TSH   Thyroid Peroxidase Antibodies (TPO) (REFL)   T3, free   T4, free     Respiratory   RESOLVED: Seasonal allergic rhinitis due to pollen   Relevant Medications   azelastine (ASTELIN) 0.1 % nasal spray     Other   Dyslipidemia    Ordered lipid panel, pending results. Work on low cholesterol diet and exercise as tolerated Continue atorvastatin 40 mg once daily      Relevant Orders   Lipid panel   Tobacco abuse    Smoking cessation instruction/counseling given:  counseled patient on the dangers of tobacco use, advised patient to stop smoking, and reviewed strategies to maximize success. Pt states she is going to continue on with hypnotizing app.        Elevated hemoglobin (HCC) - Primary    Repeat cbc pending results Pt is a smoker      Relevant Orders   B12 and  Folate Panel   CBC   History of colon polyps   Anxiety state    dicussed options.  rx hydroxyxine prn anxiety state (50 MG)  Also referral placed for psychology for work on anxiety reducing techniques      Relevant Medications   hydrOXYzine (VISTARIL) 50 MG capsule   Other Relevant Orders   Ambulatory referral to Psychology   TSH   Thyroid Peroxidase Antibodies (TPO) (REFL)   T3, free   T4, free   Acute cough    suspected ongoing due to chronic rhinitis, start flonase daily and start astelin RX sent to pharmacy.  Pt states singulair not helpful.       Relevant Medications   benzonatate (TESSALON) 200 MG capsule    Meds ordered this encounter  Medications   hydrOXYzine (VISTARIL) 50 MG capsule    Sig: Take 1 capsule (50 mg total) by mouth 3 (three) times daily as needed.    Dispense:  30 capsule    Refill:  0    Order Specific Question:   Supervising Provider    Answer:   BEDSOLE, AMY E [[id number]]   azelastine (ASTELIN) 0.1 % nasal spray    Sig: Place 1 spray into both nostrils 2 (two) times daily. Use in each nostril as directed    Dispense:  30 mL    Refill:  12    Order Specific Question:   Supervising Provider    Answer:   Diona Browner, AMY E [[id number]]   benzonatate (TESSALON) 200 MG capsule    Sig: Take 1 capsule (200 mg total) by mouth 2 (two) times daily as needed for cough.    Dispense:  20 capsule    Refill:  0    Order Specific Question:   Supervising Provider    Answer:   Diona Browner, AMY E [[id number]]    Follow-up: Return in about 3 months (around 04/14/2022) for regular follow up appt .    Eugenia Pancoast, FNP

## 2022-01-13 NOTE — Assessment & Plan Note (Signed)
suspected ongoing due to chronic rhinitis, start flonase daily and start astelin RX sent to pharmacy.  Pt states singulair not helpful.

## 2022-01-13 NOTE — Assessment & Plan Note (Signed)
Smoking cessation instruction/counseling given:  counseled patient on the dangers of tobacco use, advised patient to stop smoking, and reviewed strategies to maximize success. Pt states she is going to continue on with hypnotizing app.

## 2022-01-15 LAB — PROLACTIN: Prolactin: 4.3 ng/mL

## 2022-01-15 LAB — THYROID PEROXIDASE ANTIBODIES (TPO) (REFL): Thyroperoxidase Ab SerPl-aCnc: <1 [IU]/mL (ref <9)

## 2022-01-28 ENCOUNTER — Ambulatory Visit (INDEPENDENT_AMBULATORY_CARE_PROVIDER_SITE_OTHER): Payer: 59 | Admitting: Clinical

## 2022-01-28 DIAGNOSIS — F411 Generalized anxiety disorder: Secondary | ICD-10-CM | POA: Diagnosis not present

## 2022-01-28 DIAGNOSIS — F332 Major depressive disorder, recurrent severe without psychotic features: Secondary | ICD-10-CM | POA: Diagnosis not present

## 2022-01-28 NOTE — Progress Notes (Signed)
                Haja Crego, LCSW 
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
## 2022-01-30 ENCOUNTER — Other Ambulatory Visit: Payer: Self-pay | Admitting: Internal Medicine

## 2022-01-30 ENCOUNTER — Encounter: Payer: Self-pay | Admitting: Internal Medicine

## 2022-01-30 ENCOUNTER — Inpatient Hospital Stay: Payer: 59 | Attending: Internal Medicine | Admitting: Internal Medicine

## 2022-01-30 ENCOUNTER — Inpatient Hospital Stay: Payer: 59

## 2022-01-30 ENCOUNTER — Other Ambulatory Visit: Payer: Self-pay

## 2022-01-30 DIAGNOSIS — F17210 Nicotine dependence, cigarettes, uncomplicated: Secondary | ICD-10-CM | POA: Diagnosis not present

## 2022-01-30 DIAGNOSIS — D751 Secondary polycythemia: Secondary | ICD-10-CM

## 2022-01-30 LAB — CBC WITH DIFFERENTIAL/PLATELET
Abs Immature Granulocytes: 0.02 K/uL (ref 0.00–0.07)
Basophils Absolute: 0 K/uL (ref 0.0–0.1)
Basophils Relative: 1 %
Eosinophils Absolute: 0.1 K/uL (ref 0.0–0.5)
Eosinophils Relative: 2 %
HCT: 48.2 % — ABNORMAL HIGH (ref 36.0–46.0)
Hemoglobin: 17.2 g/dL — ABNORMAL HIGH (ref 12.0–15.0)
Immature Granulocytes: 0 %
Lymphocytes Relative: 38 %
Lymphs Abs: 3 K/uL (ref 0.7–4.0)
MCH: 31.3 pg (ref 26.0–34.0)
MCHC: 35.7 g/dL (ref 30.0–36.0)
MCV: 87.8 fL (ref 80.0–100.0)
Monocytes Absolute: 0.6 K/uL (ref 0.1–1.0)
Monocytes Relative: 8 %
Neutro Abs: 4.2 K/uL (ref 1.7–7.7)
Neutrophils Relative %: 51 %
Platelets: 206 K/uL (ref 150–400)
RBC: 5.49 MIL/uL — ABNORMAL HIGH (ref 3.87–5.11)
RDW: 12.4 % (ref 11.5–15.5)
WBC: 8 K/uL (ref 4.0–10.5)
nRBC: 0 % (ref 0.0–0.2)

## 2022-01-30 LAB — LACTATE DEHYDROGENASE: LDH: 119 U/L (ref 98–192)

## 2022-01-30 NOTE — Progress Notes (Signed)
New patient evaluation.   

## 2022-01-30 NOTE — Assessment & Plan Note (Addendum)
#  Erythrocytosis-asymptomatic.  Likely secondary to longstanding history of smoking/COPD.  At a long discussion the patient regarding the potential causes of her abnormal blood counts-both primary bone marrow problem versus reactive/secondary causes.  For now I  recommend checking CBC;CMP jak 2; LDH.  #Again low clinical suspicion for MPN.  Is very unlikely patient will need any phlebotomies unless hematocrit greater than 52 or so or patient symptomatic [worsening headaches; worsening fatigue etc.].  # Smoking: since 4 y- 21 cig/day.  Recommend quitting smoking. MAY 2023-screening program-CT scan negative for any cancer.  Continue annual surveillance.  #Emphysema-inhalers; stable see above.   Thank you, Ms.Dugal for allowing me to participate in the care of your pleasant patient. Please do not hesitate to contact me with questions or concerns in the interim.  mychart  # DISPOSITION: # labs today- ordered.  # follow up as needed-Dr.B

## 2022-01-30 NOTE — Progress Notes (Signed)
[facility] CONSULT NOTE  Patient Care Team: Eugenia Pancoast, FNP as PCP - General (Family Medicine)  CHIEF COMPLAINTS/PURPOSE OF CONSULTATION: ERYTHROCYTOSIS   HEMATOLOGY HISTORY  # ERYTHROCYTOSIS  [Hb; WBC; platelets]  HISTORY OF PRESENTING ILLNESS:  Sandra Montes 51 y.o.  female pleasant patient long-t standing history of smoking was been referred to [place] for further evaluation of erythrocytosis incidentally noted on routine blood work.  Incidentally patient noted to have elevated hematocrit hemoglobin-.  Patient denies any headaches.  History of stroke for blood clots.   Smoking: Since age of 68.;  Smokes less than a pack a day. Lung Disease/COPD: Hx of DVT/PE or Stroke: none   Review of Systems  Constitutional:  Negative for chills, diaphoresis, fever, malaise/fatigue and weight loss.  HENT:  Negative for nosebleeds and sore throat.   Eyes:  Negative for double vision.  Respiratory:  Negative for cough, hemoptysis, sputum production, shortness of breath and wheezing.   Cardiovascular:  Negative for chest pain, palpitations, orthopnea and leg swelling.  Gastrointestinal:  Negative for abdominal pain, blood in stool, constipation, diarrhea, heartburn, melena, nausea and vomiting.  Genitourinary:  Negative for dysuria, frequency and urgency.  Musculoskeletal:  Negative for back pain and joint pain.  Skin: Negative.  Negative for itching and rash.  Neurological:  Negative for dizziness, tingling, focal weakness, weakness and headaches.  Endo/Heme/Allergies:  Does not bruise/bleed easily.  Psychiatric/Behavioral:  Negative for depression. The patient is not nervous/anxious and does not have insomnia.      MEDICAL HISTORY:  Past Medical History:  Diagnosis Date   HLD (hyperlipidemia)    Hypertension     SURGICAL HISTORY: Past Surgical History:  Procedure Laterality Date   CESAREAN SECTION     1993   CHOLECYSTECTOMY     1996   COLONOSCOPY WITH  PROPOFOL N/A 11/18/2021   Procedure: COLONOSCOPY WITH PROPOFOL;  Surgeon: Lin Landsman, MD;  Location: [facility] ENDOSCOPY;  Service: Gastroenterology;  Laterality: N/A;   TUBAL LIGATION     1995    SOCIAL HISTORY: Social History   Socioeconomic History   Marital status: Divorced    Spouse name: Not on file   Number of children: 3   Years of education: Not on file   Highest education level: Not on file  Occupational History    Employer: [employer]  Tobacco Use   Smoking status: Every Day    Packs/day: 1.50    Years: 37.00    Total pack years: 55.50    Types: Cigarettes   Smokeless tobacco: Never   Tobacco comments:    Pt state 1-1.5 ppd 08/20/21  Vaping Use   Vaping Use: Never used  Substance and Sexual Activity   Alcohol use: Not Currently    Comment: h/o alcoholism quit 2017   Drug use: Yes    Types: Marijuana    Comment: edibles last time 11/16/2021   Sexual activity: Not Currently    Partners: Male  Other Topics Concern   Not on file  Social History Narrative   Active smoker- lives in [place]; with mother- care giver; with quality/control- mortgage.    Social Determinants of Health   Financial Resource Strain: Not on file  Food Insecurity: Not on file  Transportation Needs: No Transportation Needs (01/30/2022)   PRAPARE - Hydrologist (Medical): No    Lack of Transportation (Non-Medical): No  Physical Activity: Not on file  Stress: Not on file  Social Connections: Not on file  Intimate Partner Violence: Not on file    FAMILY HISTORY: Family History  Problem Relation Age of Onset   Alcohol abuse Mother    Heart attack Mother 81   Skin cancer Father    Alcohol abuse Father    Prostate cancer Father    Prostate cancer Paternal Grandfather    Skin cancer Paternal Grandfather    Asthma Daughter     ALLERGIES:  is allergic to buspar [buspirone], lisinopril, morphine, and sulfa antibiotics.  MEDICATIONS:  Current  Outpatient Medications  Medication Sig Dispense Refill   albuterol (VENTOLIN HFA) 108 (90 Base) MCG/ACT inhaler Inhale 2 puffs into the lungs every 4 (four) hours as needed for wheezing or shortness of breath. 18 g 6   atorvastatin (LIPITOR) 40 MG tablet SMARTSIG:1 Tablet(s) By Mouth Every Evening 90 tablet 1   azelastine (ASTELIN) 0.1 % nasal spray Place 1 spray into both nostrils 2 (two) times daily. Use in each nostril as directed 30 mL 12   B Complex Vitamins (VITAMIN B-COMPLEX) TABS Take 1 tablet by mouth daily.     benzonatate (TESSALON) 200 MG capsule Take 1 capsule (200 mg total) by mouth 2 (two) times daily as needed for cough. 20 capsule 0   cetirizine (ZYRTEC) 10 MG tablet Take by mouth.     Cinnamon 500 MG capsule Take by mouth.     cyclobenzaprine (FLEXERIL) 10 MG tablet Take 1 tablet (10 mg total) by mouth 3 (three) times daily as needed for muscle spasms. 60 tablet 0   fluticasone (FLONASE) 50 MCG/ACT nasal spray Place 2 sprays into both nostrils daily. 16 g 6   hydrOXYzine (VISTARIL) 50 MG capsule Take 1 capsule (50 mg total) by mouth 3 (three) times daily as needed. 30 capsule 0   lidocaine-prilocaine (EMLA) cream Apply topically as needed.     Magnesium Cl-Calcium Carbonate (SLOW MAGNESIUM/CALCIUM PO) Take by mouth.     metoprolol succinate (TOPROL-XL) 25 MG 24 hr tablet Take one po qd along with 50 mg ER tablet for total of 75 mg daily 90 tablet 1   metoprolol succinate (TOPROL-XL) 50 MG 24 hr tablet Take one po qd along with 25 mg ER tablet for total of 75 mg daily 90 tablet 1   Omega-3 Fatty Acids (FISH OIL) 1000 MG CAPS Take by mouth.     Tiotropium Bromide-Olodaterol (STIOLTO RESPIMAT) 2.5-2.5 MCG/ACT AERS Inhale 2 puffs into the lungs daily. 1 each 11   Turmeric (QC TUMERIC COMPLEX) 500 MG CAPS Take by mouth.     valsartan-hydrochlorothiazide (DIOVAN-HCT) 320-25 MG tablet Take 1 tablet by mouth daily. 90 tablet 1   No current facility-administered medications for this  visit.     Marland Kitchen  PHYSICAL EXAMINATION:   Vitals:   01/30/22 1100  BP: 130/85  Pulse: 64  Resp: 16  Temp: 98.4 F (36.9 C)   Filed Weights   01/30/22 1100  Weight: 206 lb 12.8 oz (93.8 kg)    Physical Exam Vitals and nursing note reviewed.  HENT:     Head: Normocephalic and atraumatic.     Mouth/Throat:     Pharynx: Oropharynx is clear.  Eyes:     Extraocular Movements: Extraocular movements intact.     Pupils: Pupils are equal, round, and reactive to light.  Cardiovascular:     Rate and Rhythm: Normal rate and regular rhythm.  Pulmonary:     Comments: Decreased breath sounds bilaterally.  Abdominal:     Palpations: Abdomen is soft.  Musculoskeletal:  General: Normal range of motion.     Cervical back: Normal range of motion.  Skin:    General: Skin is warm.  Neurological:     General: No focal deficit present.     Mental Status: She is alert and oriented to person, place, and time.  Psychiatric:        Behavior: Behavior normal.        Judgment: Judgment normal.      LABORATORY DATA:  I have reviewed the data as listed Lab Results  Component Value Date   WBC 8.2 01/13/2022   HGB 17.0 (H) 01/13/2022   HCT 48.4 (H) 01/13/2022   MCV 90.3 01/13/2022   PLT 201.0 01/13/2022   Recent Labs    07/04/21 1510  NA 139  K 4.0  CL 98  CO2 31  GLUCOSE 96  BUN 12  CREATININE 0.65  CALCIUM 9.2  PROT 6.8  AST 16  ALT 16  BILITOT 0.6     No results found.  ASSESSMENT & PLAN:   Erythrocytosis #Erythrocytosis-asymptomatic.  Likely secondary to longstanding history of smoking/COPD.  At a long discussion the patient regarding the potential causes of her abnormal blood counts-both primary bone marrow problem versus reactive/secondary causes.  For now I  recommend checking CBC;CMP jak 2; LDH.  #Again low clinical suspicion for MPN.  Is very unlikely patient will need any phlebotomies unless hematocrit greater than 52 or so or patient symptomatic  [worsening headaches; worsening fatigue etc.].  # Smoking: since 18 y- 56 cig/day.  Recommend quitting smoking. MAY 2023-screening program-CT scan negative for any cancer.  Continue annual surveillance.  #Emphysema-inhalers; stable see above.   Thank you, Ms.Dugal for allowing me to participate in the care of your pleasant patient. Please do not hesitate to contact me with questions or concerns in the interim.  mychart  # DISPOSITION: # labs today- ordered.  # follow up as needed-Dr.B      All questions were answered. The patient knows to call the clinic with any problems, questions or concerns.  Thank you Dr. for allowing me to participate in the care of your pleasant patient. Please do not hesitate to contact me with questions or concerns in the interim.   Cammie Sickle, MD 01/30/2022 12:32 PM   # 45 minutes face-to-face with the patient discussing the above plan of care; more than 50% of time spent on counseling and coordination.

## 2022-02-06 LAB — JAK2 GENOTYPR

## 2022-02-11 ENCOUNTER — Ambulatory Visit: Payer: 59 | Admitting: Clinical

## 2022-02-17 ENCOUNTER — Ambulatory Visit: Payer: 59 | Admitting: Clinical

## 2022-02-17 DIAGNOSIS — F332 Major depressive disorder, recurrent severe without psychotic features: Secondary | ICD-10-CM | POA: Diagnosis not present

## 2022-02-17 DIAGNOSIS — F411 Generalized anxiety disorder: Secondary | ICD-10-CM | POA: Diagnosis not present

## 2022-02-17 NOTE — Progress Notes (Signed)
[facility] Counselor/Therapist Progress Note  Patient ID: Sandra Montes, MRN: [id number]    Date: 02/17/22  Time Spent: 9:31  am - 10:26 am : 55 Minutes  Treatment Type: Individual Therapy.  Reported Symptoms: Patient stated, "my mood is still kind of the same place it's been in".   Mental Status Exam: Appearance:  Neat     Behavior: Appropriate  Motor: Normal  Speech/Language:  Clear and Coherent  Affect: Tearful  Mood: depressed  Thought process: normal  Thought content:   WNL  Sensory/Perceptual disturbances:   WNL  Orientation: oriented to person, place, and situation  Attention: Good  Concentration: Good  Memory: WNL  Fund of knowledge:  Good  Insight:   Good  Judgment:  Fair  Impulse Control: Fair   Risk Assessment: Danger to Self:  No Patient denied current suicidal ideation Self-injurious Behavior: No Danger to Others: No Patient denied current homicidal ideation Duty to Warn:no Physical Aggression / Violence:No  Access to Firearms a concern: No  Gang Involvement:No   Subjective:  Patient stated, "my mood is still kind of the same place it's been in". Patient reported her mother currently lives with her due to her mother's health concerns. Patient reported she is aware the holidays are difficulty for her mother and is trying to prepare. Patient stated,  "that makes sense" in response to diagnoses. Patient reported a history of increased sleep when her children were younger and she experienced depressive symptoms. Patient reported no questions about the diagnoses. Patient reported her daughter was diagnosed with Bipolar I Disorder and Borderline Personality Disorder. Patient reported her relationship with her daughter is a stressor. Patient reported she previously participated in a [place] group to support her daughter who also participated in [place]. Patient stated, "Ive always had to be everybody's  everything".  Patient reported feeling she's "gone backwards" since her mother moved in with patient. Patient reported her mother is "emotionally manipulative" and is a trigger for patient. Patient stated, "I definitely feel like that's a requirement at this point" in response to participation in therapy.  Patient reported she is open to a referral to a psychiatrist.   Interventions: Motivational Interviewing. Clinician reviewed diagnosis and treatment recommendations. Provided psycho education related to diagnoses, treatment recommendations, bipolar disorder and borderline personality disorder. Discussed recommendation for [facility] family support group.    Diagnosis:  Generalized anxiety disorder  Severe episode of recurrent major depressive disorder, without psychotic features (HCC)  R/O Panic Disorder R/O Cannabis Use Disorder R/O Alcohol Use Disorder  Plan: Goals to be developed during follow up appointment on 03/04/22                    Katherina Right, LCSW

## 2022-03-02 ENCOUNTER — Other Ambulatory Visit: Payer: Self-pay

## 2022-03-02 DIAGNOSIS — E785 Hyperlipidemia, unspecified: Secondary | ICD-10-CM

## 2022-03-03 MED ORDER — ATORVASTATIN CALCIUM 40 MG PO TABS: ORAL_TABLET | ORAL | 1 refills | Status: DC

## 2022-03-04 ENCOUNTER — Ambulatory Visit: Payer: 59 | Admitting: Clinical

## 2022-03-31 ENCOUNTER — Ambulatory Visit: Payer: 59 | Admitting: Clinical

## 2022-03-31 DIAGNOSIS — F332 Major depressive disorder, recurrent severe without psychotic features: Secondary | ICD-10-CM

## 2022-03-31 DIAGNOSIS — F411 Generalized anxiety disorder: Secondary | ICD-10-CM | POA: Diagnosis not present

## 2022-03-31 NOTE — Progress Notes (Signed)
[facility] Counselor/Therapist Progress Note  Patient ID: Sandra Montes, MRN: [id number]    Date: 03/31/22  Time Spent: 2:32  pm - 3:23 pm : 51 Minutes  Treatment Type: Individual Therapy.  Reported Symptoms: Patient reported feeling anxious and decreased sleep in response to a recent stressor  Mental Status Exam: Appearance:  Well Groomed     Behavior: Appropriate  Motor: Normal  Speech/Language:  Clear and Coherent  Affect: Appropriate  Mood: normal  Thought process: normal  Thought content:   WNL  Sensory/Perceptual disturbances:   WNL  Orientation: oriented to person, place, and situation  Attention: Good  Concentration: Good  Memory: WNL  Fund of knowledge:  Good  Insight:   Good  Judgment:  Fair  Impulse Control: Fair   Risk Assessment: Danger to Self:  No Patient denied current suicidal ideation Self-injurious Behavior: No Danger to Others: No Patient denied current homicidal ideation Duty to Warn:no Physical Aggression / Violence:No  Access to Firearms a concern: No  Gang Involvement:No   Subjective:  Patient reported her daughter was hospitalized recently due to complications of pregnancy. Patient reported decreased sleep and feeling anxious in response to daughter being in the hospital. Patient reported her granddaughter was born recently and she received a promotion at work. Patient stated, "I'm alright, Im tried". Patient stated "to get a better handle on my personal interaction , especially with my mother"as a goal for therapy. Patient reported to develop coping mechanisms to decrease feelings of anger and decrease self sabotaging behaviors (over spending, tobacco use) as additional goals for therapy. Patient stated, "everybody else's needs seem to be more important than mine". Patient reported she would like to learn how to establish healthy boundaries with her mother. Patient reported her mother wasn't available to patient as a child and  patient reported she resents her mother as a result. Patient stated, "I'm a people pleaser". Patient stated,  "I didn't have self esteem for many years", "growing up I couldn't do anything right in my peer group or my family". Patient reported she feels her self esteem has improved in some areas but not in others.   Interventions: Motivational Interviewing. Clinician utilized motivational interviewing to explore potential goals for therapy. Clinician utilized a task centered approach in collaboration with patient to develop goals for therapy. Provided psycho education related to the use of a thought record and requested patient complete thought record for homework. Patient agreed to treatment plan.   Diagnosis:  Generalized anxiety disorder   Severe episode of recurrent major depressive disorder, without psychotic features (HCC)   R/O Panic Disorder R/O Cannabis Use Disorder R/O Alcohol Use Disorder   Plan: Patient is to utilize Cognitive Behavioral Therapy, dialectical behavioral therapy skills, thought re-framing, relaxation techniques, positive self talk and coping strategies to decrease symptoms associated with Major Depressive Disorder and Generalized Anxiety Disorder.  Frequency: weekly  Modality: individual     Long-term goal:   Learn and develop strategies to increase self care and make patient's mental and physical health a priority, such as, decrease in tobacco use, decrease in over spending behaviors  Target Date: 04/01/23  Progress: progressing   Short-term goal:  Identify triggers for feelings of anger and develop coping strategies to utilize in response to feelings of anger to prevent/reduce patient from "shutting down" in response to anger per patient's report   Target Date: 09/29/22  Progress: progressing   Develop effective communication strategies for patient to utilize when expressing her thoughts and feelings to  others in a controlled and assertive way   Target  Date: 09/29/22  Progress: progressing   Utilize effective communication strategies to establish healthy boundaries with patient's mother and her children to improve patient's relationship with her mother and children  Target Date: 09/29/22  Progress: progressing   Identify, challenge, and replace negative core beliefs/schema, thought patterns, and negative self talk that contribute to feelings of depression, anxiety, and low self esteem and replace with positive thoughts, beliefs, and positive self talk per patient's report   Target Date: 09/29/22  Progress: progressing                                                 Katherina Right, LCSW

## 2022-04-02 ENCOUNTER — Telehealth: Payer: 59 | Admitting: Family Medicine

## 2022-04-02 DIAGNOSIS — J069 Acute upper respiratory infection, unspecified: Secondary | ICD-10-CM | POA: Diagnosis not present

## 2022-04-02 MED ORDER — BENZONATATE 100 MG PO CAPS: 200.0000 mg | ORAL_CAPSULE | Freq: Two times a day (BID) | ORAL | 0 refills | Status: DC | PRN

## 2022-04-02 NOTE — Progress Notes (Signed)
E-Visit for Upper Respiratory Infection   We are sorry you are not feeling well.  Here is how we plan to help!  Based on what you have shared with me, it looks like you may have a viral upper respiratory infection.  Upper respiratory infections are caused by a large number of viruses; however, rhinovirus is the most common cause.   Symptoms vary from person to person, with common symptoms including sore throat, cough, fatigue or lack of energy and feeling of general discomfort.  A low-grade fever of up to 100.4 may present, but is often uncommon.  Symptoms vary however, and are closely related to a person's age or underlying illnesses.  The most common symptoms associated with an upper respiratory infection are nasal discharge or congestion, cough, sneezing, headache and pressure in the ears and face.  These symptoms usually persist for about 3 to 10 days, but can last up to 2 weeks.  It is important to know that upper respiratory infections do not cause serious illness or complications in most cases.    Upper respiratory infections can be transmitted from person to person, with the most common method of transmission being a person's hands.  The virus is able to live on the skin and can infect other persons for up to 2 hours after direct contact.  Also, these can be transmitted when someone coughs or sneezes; thus, it is important to cover the mouth to reduce this risk.  To keep the spread of the illness at [place], good hand hygiene is very important.  This is an infection that is most likely caused by a virus. There are no specific treatments other than to help you with the symptoms until the infection runs its course.  We are sorry you are not feeling well.  Here is how we plan to help!   For nasal congestion, you may use an oral decongestants such as Mucinex D or if you have glaucoma or high blood pressure use plain Mucinex.  Saline nasal spray or nasal drops can help and can safely be used as often as  needed for congestion.  For your congestion,  use your Fluticasone nasal spray one spray in each nostril twice a day  If you do not have a history of heart disease, hypertension, diabetes or thyroid disease, prostate/bladder issues or glaucoma, you may also use Sudafed to treat nasal congestion.  It is highly recommended that you consult with a pharmacist or your primary care physician to ensure this medication is safe for you to take.     If you have a cough, you may use cough suppressants such as Delsym and Robitussin.  If you have glaucoma or high blood pressure, you can also use Coricidin HBP.   For cough I have prescribed for you A prescription cough medication called Tessalon Perles 100 mg. You may take 1-2 capsules every 8 hours as needed for cough  If you have a sore or scratchy throat, use a saltwater gargle-  to  teaspoon of salt dissolved in a 4-ounce to 8-ounce glass of warm water.  Gargle the solution for approximately 15-30 seconds and then spit.  It is important not to swallow the solution.  You can also use throat lozenges/cough drops and Chloraseptic spray to help with throat pain or discomfort.  Warm or cold liquids can also be helpful in relieving throat pain.  For headache, pain or general discomfort, you can use Ibuprofen or Tylenol as directed.   Some authorities believe  that zinc sprays or the use of Echinacea may shorten the course of your symptoms.   HOME CARE Only take medications as instructed by your medical team. Be sure to drink plenty of fluids. Water is fine as well as fruit juices, sodas and electrolyte beverages. You may want to stay away from caffeine or alcohol. If you are nauseated, try taking small sips of liquids. How do you know if you are getting enough fluid? Your urine should be a pale yellow or almost colorless. Get rest. Taking a steamy shower or using a humidifier may help nasal congestion and ease sore throat pain. You can place a towel over your head  and breathe in the steam from hot water coming from a faucet. Using a saline nasal spray works much the same way. Cough drops, hard candies and sore throat lozenges may ease your cough. Avoid close contacts especially the very young and the elderly Cover your mouth if you cough or sneeze Always remember to wash your hands.   GET HELP RIGHT AWAY IF: You develop worsening fever. If your symptoms do not improve within 10 days You develop yellow or green discharge from your nose over 3 days. You have coughing fits You develop a severe head ache or visual changes. You develop shortness of breath, difficulty breathing or start having chest pain Your symptoms persist after you have completed your treatment plan  MAKE SURE YOU  Understand these instructions. Will watch your condition. Will get help right away if you are not doing well or get worse.  Thank you for choosing an e-visit.  Your e-visit answers were reviewed by a board certified advanced clinical practitioner to complete your personal care plan. Depending upon the condition, your plan could have included both over the counter or prescription medications.  Please review your pharmacy choice. Make sure the pharmacy is open so you can pick up prescription now. If there is a problem, you may contact your provider through [employer] and have the prescription routed to another pharmacy.  Your safety is important to [place]. If you have drug allergies check your prescription carefully.   For the next 24 hours you can use MyChart to ask questions about today's visit, request a non-urgent call back, or ask for a work or school excuse. You will get an email in the next two days asking about your experience. I hope that your e-visit has been valuable and will speed your recovery.    I provided 5 minutes of non face-to-face time during this encounter for chart review, medication and order placement, as well as and documentation.

## 2022-04-23 ENCOUNTER — Ambulatory Visit: Payer: 59 | Admitting: Clinical

## 2022-04-23 DIAGNOSIS — F332 Major depressive disorder, recurrent severe without psychotic features: Secondary | ICD-10-CM | POA: Diagnosis not present

## 2022-04-23 DIAGNOSIS — F411 Generalized anxiety disorder: Secondary | ICD-10-CM

## 2022-04-23 NOTE — Progress Notes (Signed)
                Lidwina Kaner, LCSW 

## 2022-04-23 NOTE — Progress Notes (Signed)
[facility] Counselor/Therapist Progress Note  Patient ID: Sandra Montes, MRN: [id number],    Date: 04/23/2022  Time Spent: 1:35pm - 2:34pm : 59 minutes   Treatment Type: Individual Therapy  Reported Symptoms: Patient stated, "I'm forcing myself to be on a higher level" in response to current mood.  Mental Status Exam: Appearance:  Well Groomed     Behavior: Appropriate  Motor: Normal  Speech/Language:  Clear and Coherent  Affect: Tearful  Mood: sad  Thought process: normal  Thought content:   WNL  Sensory/Perceptual disturbances:   WNL  Orientation: oriented to person, place, and situation  Attention: Good  Concentration: Good  Memory: WNL  Fund of knowledge:  Good  Insight:   Good  Judgment:  Fair  Impulse Control: Fair   Risk Assessment: Danger to Self:  No Patient denied current suicidal ideation Self-injurious Behavior: No Danger to Others: No Patient denied current homicidal ideation Duty to Warn:no Physical Aggression / Violence:No  Access to Firearms a concern: No  Gang Involvement:No   Subjective: Patient stated, "its a work in progress" in regards to her new position at work. Patient stated, "I'm preparing for the winter". Patient reported a history of Seasonal Affective Disorder and reported she tries to finds projects to work on during the winter months to cope with increase in depressive symptoms. Patient reported depressive symptoms typically increase around the middle of January and extend through the middle of march. Patient reported she tries to be proactive in response to seasonal affective disorder. Patient reported the following symptoms typically occur during the winter months:  increase in sleep, decrease in hygiene, difficulty completing tasks. Patient stated,  "I shut down". Patient stated, "I'm forcing myself to be on a higher level" in response to current mood. Patient stated, "not very good" in response to clinician's inquiry  about patient's thought record for homework. Patient reported she forgot about completing the thought record after two days due to holiday activities. Patient reported she recently celebrated her birthday with family members but reported her daughter/granddaughter did not attend. Patient reported she enjoys spending time with her granddaughter and feels she is  missing out on spending time with her granddaughter due to limited communication with her daughter. Patient reported she attempts to communicate with  her daughter but reported her daughter doesn't respond often. Patient reported her daughter does not respond to other family members as well. Patient stated, "it pisses me off" in response to her daughter's behaviors. Patient reported she feels her daughter is angry with her for her actions/decisions as a parent but reported she doesn't know why her daughter is angry with her and would like to fix the issue. Patient reported her daughter was diagnosed with bipolar disorder and displays borderline personality disorder traits. Patient reported she feels her granddaughter needs her support due to her daughter's behaviors. Patient reported her mother and her daughter have similar personalities and reported this is a trigger for patient's thoughts regarding her daughter. Patient stated, "I'm trying to figure out how to not let it affect me the way that it does".     Interventions: Cognitive Behavioral Therapy. Discussed patient's report of a historical diagnosis of Seasonal Affective Disorder and symptoms patient experiences during the winter months. Explored and identified coping strategies patient utilizes in response to increased depressive symptoms during winter months. Reviewed patient's homework and identified barriers to completing homework. Provided supportive therapy, active listening, and validation as patient discussed her daughter/granddaughter not attending patient's birthday celebration and  patient's  thoughts/feelings in response to the situation. Explored patient's/daughter's communication and daughter's communication with other family members. Challenged patient's belief that her daughter is angry with her for her actions as a parent and explored evidence in support of and contrary to patient's belief. Explored and identified triggers for patient's thoughts associated with her daughter. Provided psycho education related to Borderline Personality Disorder. Discussed writing a letter to her daughter to express her thoughts/feelings.   Diagnosis:  Generalized anxiety disorder   Severe episode of recurrent major depressive disorder, without psychotic features (HCC)   R/O Panic Disorder R/O Cannabis Use Disorder R/O Alcohol Use Disorder     Plan: Patient is to utilize Cognitive Behavioral Therapy, dialectical behavioral therapy skills, thought re-framing, relaxation techniques, positive self talk and coping strategies to decrease symptoms associated with Major Depressive Disorder and Generalized Anxiety Disorder.   Frequency: weekly  Modality: individual      Long-term goal:   Learn and develop strategies to increase self care and make patient's mental and physical health a priority, such as, decrease in tobacco use, decrease in over spending behaviors   Target Date: 04/01/23  Progress: progressing    Short-term goal:  Identify triggers for feelings of anger and develop coping strategies to utilize in response to feelings of anger to prevent/reduce patient from "shutting down" in response to anger per patient's report    Target Date: 09/29/22  Progress: progressing    Develop effective communication strategies for patient to utilize when expressing her thoughts and feelings to others in a controlled and assertive way    Target Date: 09/29/22  Progress: progressing    Utilize effective communication strategies to establish healthy boundaries with patient's mother and her children to  improve patient's relationship with her mother and children   Target Date: 09/29/22  Progress: progressing    Identify, challenge, and replace negative core beliefs/schema, thought patterns, and negative self talk that contribute to feelings of depression, anxiety, and low self esteem and replace with positive thoughts, beliefs, and positive self talk per patient's report   Target Date: 09/29/22  Progress: progressing    Katherina Right, LCSW

## 2022-04-30 ENCOUNTER — Ambulatory Visit: Payer: 59 | Admitting: Clinical

## 2022-04-30 DIAGNOSIS — F411 Generalized anxiety disorder: Secondary | ICD-10-CM | POA: Diagnosis not present

## 2022-04-30 DIAGNOSIS — F332 Major depressive disorder, recurrent severe without psychotic features: Secondary | ICD-10-CM | POA: Diagnosis not present

## 2022-04-30 NOTE — Progress Notes (Signed)
                Daron Stutz, LCSW 

## 2022-04-30 NOTE — Progress Notes (Signed)
[facility] Counselor/Therapist Progress Note  Patient ID: Sandra Montes, MRN: [id number],    Date: 04/30/2022  Time Spent: 1:33pm - 2:32pm : 59 minutes   Treatment Type: Individual Therapy  Reported Symptoms: Patient reported feeling frustrated today.   Mental Status Exam: Appearance:  Well Groomed     Behavior: Appropriate  Motor: Normal  Speech/Language:  Clear and Coherent  Affect: Tearful  Mood: frustrated  Thought process: normal  Thought content:   WNL  Sensory/Perceptual disturbances:   WNL  Orientation: oriented to person, place, and situation  Attention: Good  Concentration: Good  Memory: WNL  Fund of knowledge:  Good  Insight:   Good  Judgment:  Fair  Impulse Control: Fair   Risk Assessment: Danger to Self:  No Patient denied current suicidal ideation  Self-injurious Behavior: No Danger to Others: No Patient denied current homicidal ideation  Duty to Warn:no Physical Aggression / Violence:No  Access to Firearms a concern: No  Gang Involvement:No   Subjective: Patient reported the recent holiday celebration with her family went well and reported her daughter/grandchildren attended her family's celebration. Patient reported she has been going to bed earlier to offset her mood. Patient reported experiencing pain due to arthritis and reported feeling frustrated with work. Patient reported during the last week of each year she prepares her schedule for the next year. Patient reported she maintains a work [employer], [employer] and compiles questions that arise to ask at a later time. Patient reported she recently called her son in law to coordinate spending time with her granddaughters over the holiday. Patient reported a language barrier and feeling she was previously placed in the middle of a conflict between daughter and son in law have prevented patient in the past from contacting son in law to coordinate spending time with her grandchildren. Patient stated,  "everything with me is all or nothing". Patient reported she has established boundaries with her daughter and only gives advice in specific situations. Patient reported she has tried to let her daughter know there will never be a reason she wont love her daughter. Patient reported feeling her daughter felt alone and isolated growing up. Patient reported she utilized her thought record when feeling overwhelmed.  Interventions: Cognitive Behavioral Therapy. Explored and identified triggers for feelings of frustration and decline in mood. Explored and identified coping strategies patient utilizes in response to work related stressors. Discussed barriers in communication with patient's daughter and son in law. Explored communication strategies to coordinate spending time with grandchildren. Provided psycho education related to cognitive distortions, such as, all or nothing thinking. Provided additional psycho education related to Borderline Personality Disorder/traits. Reviewed patient's thought record for homework and requested patient continue thought record for homework.    Diagnosis:  Generalized anxiety disorder   Severe episode of recurrent major depressive disorder, without psychotic features (HCC)   R/O Panic Disorder R/O Cannabis Use Disorder R/O Alcohol Use Disorder     Plan: Patient is to utilize Cognitive Behavioral Therapy, dialectical behavioral therapy skills, thought re-framing, relaxation techniques, positive self talk and coping strategies to decrease symptoms associated with Major Depressive Disorder and Generalized Anxiety Disorder.   Frequency: weekly  Modality: individual      Long-term goal:   Learn and develop strategies to increase self care and make patient's mental and physical health a priority, such as, decrease in tobacco use, decrease in over spending behaviors   Target Date: 04/01/23  Progress: progressing    Short-term goal:  Identify triggers for feelings of  anger and develop coping strategies to utilize in response to feelings of anger to prevent/reduce patient from "shutting down" in response to anger per patient's report    Target Date: 09/29/22  Progress: progressing    Develop effective communication strategies for patient to utilize when expressing her thoughts and feelings to others in a controlled and assertive way    Target Date: 09/29/22  Progress: progressing    Utilize effective communication strategies to establish healthy boundaries with patient's mother and her children to improve patient's relationship with her mother and children   Target Date: 09/29/22  Progress: progressing    Identify, challenge, and replace negative core beliefs/schema, thought patterns, and negative self talk that contribute to feelings of depression, anxiety, and low self esteem and replace with positive thoughts, beliefs, and positive self talk per patient's report   Target Date: 09/29/22  Progress: progressing    Katherina Right, LCSW

## 2022-05-07 ENCOUNTER — Ambulatory Visit: Payer: 59 | Admitting: Clinical

## 2022-05-07 DIAGNOSIS — F332 Major depressive disorder, recurrent severe without psychotic features: Secondary | ICD-10-CM

## 2022-05-07 DIAGNOSIS — F411 Generalized anxiety disorder: Secondary | ICD-10-CM

## 2022-05-07 NOTE — Progress Notes (Signed)
[facility] Counselor/Therapist Progress Note  Patient ID: Sandra Montes, MRN: [id number],    Date: 05/07/2022  Time Spent: 1:42pm - 2:33pm : 51 minutes   Treatment Type: Individual Therapy  Reported Symptoms: Patient reported frequent feelings of anger  Mental Status Exam: Appearance:  Well Groomed     Behavior: Appropriate  Motor: Normal  Speech/Language:  Clear and Coherent  Affect: Tearful  Mood: angry  Thought process: normal  Thought content:   WNL  Sensory/Perceptual disturbances:   WNL  Orientation: oriented to person, place, and situation  Attention: Good  Concentration: Good  Memory: WNL  Fund of knowledge:  Good  Insight:   Good  Judgment:  Fair  Impulse Control: Fair   Risk Assessment: Danger to Self:  No Patient denied current suicidal ideation Self-injurious Behavior: No Danger to Others: No Patient denied current homicidal ideation Duty to Warn:no Physical Aggression / Violence:No  Access to Firearms a concern: No  Gang Involvement:No   Subjective: Patient reported current work related stress. Patient reported she was able to spend time with her son, daughter in law, daughter, and grandchildren recently. Patient reported during the visit her son approached his sister about her limited communication with family members and sister became angry in response to her brother. Patient reported during the visit she did not approach the subject and allowed her son to facilitate the conversation. Patient reported feeling tired, overwhelmed, angry and reported she is unsure why she feels angry. Patient reported feelings of anger are constant. Patient reported she tried to go through her thought record to identify triggers for feelings of anger. Patient reported feelings of anger are typically directed "inward". Patient reported she experiences ruminating thoughts when bored and reported picking up her phone is an indicator she's feeling bored. Patient  reported ruminating thoughts occur at night and the thoughts are related to negative events that have occurred throughout her life. Patient reported difficulty thinking about the positive events that have occurred throughout her life. Patient reported difficulty following through when trying to make changes and respond differently. Patient reported she has always felt responsible for everyone else and doesn't want to add her needs to the responsibilities.  Patient reported she feels selfish for wanting to make herself a priority and stated, "if its good for me I run the other way".   Interventions: Cognitive Behavioral Therapy. Provided supportive therapy and active listening as patient discussed recent visit with her children/grandchildren and the conversation between her son and daughter. Reviewed patient's thought record for homework, explored feelings of anger, and explored potential triggers for feelings of anger. Explored ruminating thoughts and triggers for thoughts. Provided psycho education related to re-framing thoughts, putting thoughts on trial, and the importance of challenging thought distortions. Discussed starting with small changes and building habits. Discussed thought distortions of all or nothing thinking and ways in which those thoughts have impacted patient over the years. Explored barriers to patient practicing positive thoughts.  Explored and identified barriers to making self care a priority. Provided psycho education related to the importance of self care. Clinician requested patient complete gratitude journal for homework.    Diagnosis:  Generalized anxiety disorder   Severe episode of recurrent major depressive disorder, without psychotic features (HCC)   R/O Panic Disorder R/O Cannabis Use Disorder R/O Alcohol Use Disorder     Plan: Patient is to utilize Cognitive Behavioral Therapy, dialectical behavioral therapy skills, thought re-framing, relaxation techniques, positive  self talk and coping strategies to decrease symptoms associated  with Major Depressive Disorder and Generalized Anxiety Disorder.   Frequency: weekly  Modality: individual      Long-term goal:   Learn and develop strategies to increase self care and make patient's mental and physical health a priority, such as, decrease in tobacco use, decrease in over spending behaviors   Target Date: 04/01/23  Progress: progressing    Short-term goal:  Identify triggers for feelings of anger and develop coping strategies to utilize in response to feelings of anger to prevent/reduce patient from "shutting down" in response to anger per patient's report    Target Date: 09/29/22  Progress: progressing    Develop effective communication strategies for patient to utilize when expressing her thoughts and feelings to others in a controlled and assertive way    Target Date: 09/29/22  Progress: progressing    Utilize effective communication strategies to establish healthy boundaries with patient's mother and her children to improve patient's relationship with her mother and children   Target Date: 09/29/22  Progress: progressing    Identify, challenge, and replace negative core beliefs/schema, thought patterns, and negative self talk that contribute to feelings of depression, anxiety, and low self esteem and replace with positive thoughts, beliefs, and positive self talk per patient's report   Target Date: 09/29/22  Progress: progressing    Katherina Right, LCSW

## 2022-05-07 NOTE — Progress Notes (Signed)
                Cavin Longman, LCSW 

## 2022-05-17 DIAGNOSIS — N9489 Other specified conditions associated with female genital organs and menstrual cycle: Secondary | ICD-10-CM

## 2022-05-17 HISTORY — DX: Other specified conditions associated with female genital organs and menstrual cycle: N94.89

## 2022-05-27 ENCOUNTER — Ambulatory Visit: Payer: 59 | Admitting: Clinical

## 2022-05-27 DIAGNOSIS — F411 Generalized anxiety disorder: Secondary | ICD-10-CM | POA: Diagnosis not present

## 2022-05-27 DIAGNOSIS — F332 Major depressive disorder, recurrent severe without psychotic features: Secondary | ICD-10-CM | POA: Diagnosis not present

## 2022-05-27 NOTE — Progress Notes (Signed)
                Katherina Right, LCSW

## 2022-05-27 NOTE — Progress Notes (Signed)
[facility] Counselor/Therapist Progress Note  Patient ID: Sandra Montes, MRN: [id number],    Date: 05/27/2022  Time Spent: 8:36am - 9:33am : 57 minutes   Treatment Type: Individual Therapy  Reported Symptoms: Patient reported fatigue  Mental Status Exam: Appearance:  Well Groomed     Behavior: Appropriate  Motor: Normal  Speech/Language:  Clear and Coherent  Affect: Tearful  Mood: Patient stated, "up in the air"  Thought process: normal  Thought content:   WNL  Sensory/Perceptual disturbances:   WNL  Orientation: oriented to person, place, and situation  Attention: Good  Concentration: Good  Memory: WNL  Fund of knowledge:  Good  Insight:   Good  Judgment:  Good  Impulse Control: Fair   Risk Assessment: Danger to Self:  No Patient denied current suicidal ideation Self-injurious Behavior: No Danger to Others: No Patient denied current homicidal ideation Duty to Warn:no Physical Aggression / Violence:No  Access to Firearms a concern: No  Gang Involvement:No   Subjective: Patient stated, "its been fine" in response to status since last session.  Patient reported work related stress and reported she is prepared to work over time and has established boundaries with her employer regarding her work schedule. Patient reported feeling tired today but reported sleeping through the night. Patient reported she obtained more than 8 hours of sleep. Patient reported fatigue and stated "I'm tired all the time". Patient reported no history of sleep apnea. Patient stated,  "I don't make the time for that kind of stuff" in response to physical activity. Patient reported multiple areas of self care she would like to focus on but reported she is unsure of what direction to go in regarding self care. Patient reported she wants to focus on improving her physical health, discontinue smoking tobacco, improve her eating habits, improve her financial health, and address her dental  needs. Patient reported discontinuing tobacco use is her priority. Patient stated, "I need to force myself to do the things I don't want to do". Patient reported she feels she's being punished by changing her habits and reported feeling her thoughts are associated with punishment she received as a child. Patient reported she is open to discussing her goals related to tobacco use and changes in her diet with her primary care provider.   Patient reported attending a resource, such as, a smoking cessation program or nutritional program in person would be preferable.   Interventions: Cognitive Behavioral Therapy. Provided active and reflective listening as patient discussed work related stressors and patient's response. Discussed patient's report of fatigue and explored patient's sleep hygiene. Provided psycho education related to healthy sleep hygiene. Discussed patient having a conversation with her primary care physician about fatigue. Discussed patient's current physical activity level and barriers to physical activity. Continued to explore and identify barriers to self care and requested patient prioritize areas of self care she would like to see improve. Discussed patient having a conversation with her primary care provider to discuss options to decrease tobacco use and patient's goal to improve her eating habits.     Diagnosis:  Generalized anxiety disorder   Severe episode of recurrent major depressive disorder, without psychotic features (HCC)   R/O Panic Disorder R/O Cannabis Use Disorder R/O Alcohol Use Disorder     Plan: Patient is to utilize Cognitive Behavioral Therapy, dialectical behavioral therapy skills, thought re-framing, relaxation techniques, positive self talk and coping strategies to decrease symptoms associated with Major Depressive Disorder and Generalized Anxiety Disorder.   Frequency: weekly  Modality: individual      Long-term goal:   Learn and develop strategies to  increase self care and make patient's mental and physical health a priority, such as, decrease in tobacco use, decrease in over spending behaviors   Target Date: 04/01/23  Progress: progressing    Short-term goal:  Identify triggers for feelings of anger and develop coping strategies to utilize in response to feelings of anger to prevent/reduce patient from "shutting down" in response to anger per patient's report    Target Date: 09/29/22  Progress: progressing    Develop effective communication strategies for patient to utilize when expressing her thoughts and feelings to others in a controlled and assertive way    Target Date: 09/29/22  Progress: progressing    Utilize effective communication strategies to establish healthy boundaries with patient's mother and her children to improve patient's relationship with her mother and children   Target Date: 09/29/22  Progress: progressing    Identify, challenge, and replace negative core beliefs/schema, thought patterns, and negative self talk that contribute to feelings of depression, anxiety, and low self esteem and replace with positive thoughts, beliefs, and positive self talk per patient's report   Target Date: 09/29/22  Progress: progressing    Katherina Right, LCSW

## 2022-06-03 ENCOUNTER — Ambulatory Visit: Payer: 59 | Admitting: Nurse Practitioner

## 2022-06-03 ENCOUNTER — Ambulatory Visit: Payer: 59 | Admitting: Clinical

## 2022-06-03 ENCOUNTER — Encounter: Payer: Self-pay | Admitting: Nurse Practitioner

## 2022-06-03 VITALS — BP 120/64 | HR 81 | Temp 98.6°F | Ht 67.0 in | Wt 214.0 lb

## 2022-06-03 DIAGNOSIS — R051 Acute cough: Secondary | ICD-10-CM | POA: Diagnosis not present

## 2022-06-03 DIAGNOSIS — J441 Chronic obstructive pulmonary disease with (acute) exacerbation: Secondary | ICD-10-CM | POA: Insufficient documentation

## 2022-06-03 LAB — POC COVID19 BINAXNOW: SARS Coronavirus 2 Ag: NEGATIVE

## 2022-06-03 MED ORDER — AZITHROMYCIN 250 MG PO TABS: ORAL_TABLET | ORAL | 0 refills | Status: AC

## 2022-06-03 MED ORDER — PREDNISONE 20 MG PO TABS: 40.0000 mg | ORAL_TABLET | Freq: Every day | ORAL | 0 refills | Status: DC

## 2022-06-03 MED ORDER — BENZONATATE 200 MG PO CAPS: 200.0000 mg | ORAL_CAPSULE | Freq: Three times a day (TID) | ORAL | 0 refills | Status: DC | PRN

## 2022-06-03 MED ORDER — BENZONATATE 200 MG PO CAPS: 200.0000 mg | ORAL_CAPSULE | Freq: Two times a day (BID) | ORAL | 0 refills | Status: DC | PRN

## 2022-06-03 MED ORDER — ALBUTEROL SULFATE HFA 108 (90 BASE) MCG/ACT IN AERS: 2.0000 | INHALATION_SPRAY | RESPIRATORY_TRACT | 0 refills | Status: DC | PRN

## 2022-06-03 NOTE — Patient Instructions (Signed)
Nice to see you today I have sent in 3 medications to the pharmacy: antibiotic, cough pills, and steroids. I will also reup the albuterol inhaler Follow up if you do not improve Your covid test was negative

## 2022-06-03 NOTE — Progress Notes (Signed)
Acute Office Visit  Subjective:     Patient ID: Sandra Montes, female    DOB: 31-Oct-1970, 52 y.o.   MRN: [id number]  Chief Complaint  Patient presents with   Nasal Congestion    And cough x 4 days increased over time. Cough is not productive. Patient has history of pneumonia. Patient hs been taking Albuterol twice a day for last 4 days.     HPI Patient is in today for Sick symptoms with a history of HTN, emphysema, tobacco use, elevated hemoglobin, dyslipidemia  Symptoms started approx 4 days ago, started [place] with a sore thorat and lasted all day. States Monday she had some congestoin , Tuesday the congestion , today in the chest Covid vaccine: orginal 2 Flu vaccine: refused No sick contacts States that she did take tesalon peral that did help  States that she is using it twice a day for the past 4 prior days. Usually once a month outside of illness Review of Systems  Constitutional:  Positive for chills and malaise/fatigue. Negative for fever.       Appetite decreased Fluid intake good   HENT:  Positive for ear pain (full) and sinus pain. Negative for ear discharge and sore throat.   Respiratory:  Positive for cough, shortness of breath and wheezing. Negative for sputum production.   Musculoskeletal:  Positive for joint pain. Negative for myalgias.  Neurological:  Positive for headaches.        Objective:    BP 120/64   Pulse 81   Temp 98.6 F (37 C) (Oral)   Ht '5\' 7"'$  (1.702 m)   Wt 214 lb (97.1 kg)   SpO2 98%   BMI 33.52 kg/m    Physical Exam Vitals and nursing note reviewed.  Constitutional:      Appearance: Normal appearance.  HENT:     Right Ear: Tympanic membrane, ear canal and external ear normal.     Left Ear: Tympanic membrane, ear canal and external ear normal.     Mouth/Throat:     Mouth: Mucous membranes are moist.     Pharynx: Posterior oropharyngeal erythema present.  Cardiovascular:     Rate and Rhythm: Normal rate and regular rhythm.      Heart sounds: Normal heart sounds.  Pulmonary:     Effort: Pulmonary effort is normal.     Breath sounds: Normal breath sounds.  Lymphadenopathy:     Cervical: No cervical adenopathy.  Neurological:     Mental Status: She is alert.     Results for orders placed or performed in visit on 06/03/22  POC COVID-19  Result Value Ref Range   SARS Coronavirus 2 Ag Negative Negative        Assessment & Plan:   Problem List Items Addressed This Visit       Respiratory   COPD exacerbation ([place])    Patient is having increased shortness of breath, increased sputum production.  Still smoking.  Per LDCT patient does have emphysema will treat for COPD exacerbation.  Treat with azithromycin 250 mg pack as instructed and prednisone 40 mg for 5 days.  Avoid NSAIDs while use discussed with patient in office.  Follow-up if no improvement.  Did renew albuterol inhaler  COVID test negative in office.      Relevant Medications   azithromycin (ZITHROMAX) 250 MG tablet   predniSONE (DELTASONE) 20 MG tablet   albuterol (VENTOLIN HFA) 108 (90 Base) MCG/ACT inhaler   benzonatate (TESSALON) 200 MG capsule  Other   Acute cough - Primary    Will treat patient with Tessalon Perles 200 g 3 times daily as needed.  Patient has had benefit with using medications in the past COVID test in office.      Relevant Medications   benzonatate (TESSALON) 200 MG capsule   Other Relevant Orders   POC COVID-19 (Completed)    Meds ordered this encounter  Medications   azithromycin (ZITHROMAX) 250 MG tablet    Sig: Take 2 tablets on day 1, then 1 tablet daily on days 2 through 5    Dispense:  6 tablet    Refill:  0    Order Specific Question:   Supervising Provider    Answer:   Loura Pardon A [[id number]]   predniSONE (DELTASONE) 20 MG tablet    Sig: Take 2 tablets (40 mg total) by mouth daily with breakfast.    Dispense:  10 tablet    Refill:  0    Order Specific Question:   Supervising Provider     Answer:   Loura Pardon A [[id number]]   DISCONTD: benzonatate (TESSALON) 200 MG capsule    Sig: Take 1 capsule (200 mg total) by mouth 2 (two) times daily as needed for cough.    Dispense:  20 capsule    Refill:  0    Order Specific Question:   Supervising Provider    Answer:   Loura Pardon A [[id number]]   albuterol (VENTOLIN HFA) 108 (90 Base) MCG/ACT inhaler    Sig: Inhale 2 puffs into the lungs every 4 (four) hours as needed for wheezing or shortness of breath.    Dispense:  18 g    Refill:  0    Order Specific Question:   Supervising Provider    Answer:   Glori Bickers MARNE A [[id number]]   benzonatate (TESSALON) 200 MG capsule    Sig: Take 1 capsule (200 mg total) by mouth 3 (three) times daily as needed for cough.    Dispense:  21 capsule    Refill:  0    Order Specific Question:   Supervising Provider    Answer:   TOWER, MARNE A [[id number]]    Return if symptoms worsen or fail to improve.  Romilda Garret, NP

## 2022-06-03 NOTE — Assessment & Plan Note (Addendum)
Patient is having increased shortness of breath, increased sputum production.  Still smoking.  Per LDCT patient does have emphysema will treat for COPD exacerbation.  Treat with azithromycin 250 mg pack as instructed and prednisone 40 mg for 5 days.  Avoid NSAIDs while use discussed with patient in office.  Follow-up if no improvement.  Did renew albuterol inhaler  COVID test negative in office.

## 2022-06-03 NOTE — Assessment & Plan Note (Addendum)
Will treat patient with Tessalon Perles 200 g 3 times daily as needed.  Patient has had benefit with using medications in the past COVID test in office.

## 2022-06-10 ENCOUNTER — Ambulatory Visit: Payer: 59 | Admitting: Clinical

## 2022-06-10 DIAGNOSIS — F332 Major depressive disorder, recurrent severe without psychotic features: Secondary | ICD-10-CM

## 2022-06-10 DIAGNOSIS — F411 Generalized anxiety disorder: Secondary | ICD-10-CM | POA: Diagnosis not present

## 2022-06-10 NOTE — Progress Notes (Signed)
[facility] Counselor/Therapist Progress Note  Patient ID: Sandra Montes, MRN: [id number],    Date: 06/10/2022  Time Spent: 8:30am - 9:32am : 62 minutes   Treatment Type: Individual Therapy  Reported Symptoms: Patient reported depressed mood  Mental Status Exam: Appearance:  Well Groomed     Behavior: Appropriate  Motor: Normal  Speech/Language:  Clear and Coherent  Affect: Tearful  Mood: depressed  Thought process: normal  Thought content:   WNL  Sensory/Perceptual disturbances:   WNL  Orientation: oriented to person, place, and situation  Attention: Good  Concentration: Good  Memory: WNL  Fund of knowledge:  Good  Insight:   Good  Judgment:  Good  Impulse Control: Fair   Risk Assessment: Danger to Self:  No  Patient denied current suicidal ideation Self-injurious Behavior: No Danger to Others: No Patient denied current homicidal ideation Duty to Warn:no Physical Aggression / Violence:No  Access to Firearms a concern: No  Gang Involvement:No   Subjective: Patient reported continued work related stress and reported feeling she is prepared for upcoming meeting. Patient reported she has been physically ill over the past two weeks which has impacted her activities. Patient reported since last session patient recognized that she has control over her activities as it relates to self care. Patient reported a history of journaling but reported concern that someone will invade her privacy and read her journal based on previous experiences. Patient reported she enjoyed journaling in the past. Patient reported she use to write in her journal 3-4 times per week.  Patient reported she has been identifying and writing down one positive aspect of each day in a calendar. Patient reported feeling she has to schedule time for self care but reported she is aware she will not follow through with self care if scheduled. Patient stated,  "if I don't have time to do it right I  procrastinate" and reported if she feels she does not have time do it "perfectly" patient will not start the task.  Patient reported she feels self care is selfish based on prior feedback from others. Patient reported she was open to journaling on password protected laptop.   Interventions: Cognitive Behavioral Therapy. Clinician conducted session in person at clinician's office at [facility]. Provided active and reflective listening as patient discussed additional work related stressors and patient's response. Explored previous coping strategies patient found beneficial and identified barriers to utilizing those strategies. Discussed implementing coping strategy of journaling and explored strategies to maintain the privacy of patient's journal, such as, keeping journal in a safe or keeping journal on password protected laptop. Discussed utilizing a gratitude journal and provided psycho education related to gratitude exercises and impact on mood. Provided psycho education related to cognitive distortions and challenging distortions. Challenged thought distortions expressed during session. Clinician requested for homework patient begin journaling and use socratic questions to challenge thought distortions.    Diagnosis:  Generalized anxiety disorder   Severe episode of recurrent major depressive disorder, without psychotic features (HCC)   R/O Panic Disorder R/O Cannabis Use Disorder R/O Alcohol Use Disorder     Plan: Patient is to utilize Cognitive Behavioral Therapy, dialectical behavioral therapy skills, thought re-framing, relaxation techniques, positive self talk and coping strategies to decrease symptoms associated with Major Depressive Disorder and Generalized Anxiety Disorder.   Frequency: weekly  Modality: individual      Long-term goal:   Learn and develop strategies to increase self care and make patient's mental and physical health a priority, such  as, decrease in tobacco  use, decrease in over spending behaviors   Target Date: 04/01/23  Progress: progressing    Short-term goal:  Identify triggers for feelings of anger and develop coping strategies to utilize in response to feelings of anger to prevent/reduce patient from "shutting down" in response to anger per patient's report    Target Date: 09/29/22  Progress: progressing    Develop effective communication strategies for patient to utilize when expressing her thoughts and feelings to others in a controlled and assertive way    Target Date: 09/29/22  Progress: progressing    Utilize effective communication strategies to establish healthy boundaries with patient's mother and her children to improve patient's relationship with her mother and children   Target Date: 09/29/22  Progress: progressing    Identify, challenge, and replace negative core beliefs/schema, thought patterns, and negative self talk that contribute to feelings of depression, anxiety, and low self esteem and replace with positive thoughts, beliefs, and positive self talk per patient's report   Target Date: 09/29/22  Progress: progressing     Katherina Right, LCSW

## 2022-06-10 NOTE — Progress Notes (Signed)
                Ivyana Locey, LCSW 

## 2022-06-17 ENCOUNTER — Ambulatory Visit: Payer: 59 | Admitting: Clinical

## 2022-06-24 ENCOUNTER — Ambulatory Visit: Payer: 59 | Admitting: Clinical

## 2022-06-24 DIAGNOSIS — F411 Generalized anxiety disorder: Secondary | ICD-10-CM | POA: Diagnosis not present

## 2022-06-24 DIAGNOSIS — F332 Major depressive disorder, recurrent severe without psychotic features: Secondary | ICD-10-CM | POA: Diagnosis not present

## 2022-06-24 NOTE — Progress Notes (Signed)
                Febe Champa, LCSW 

## 2022-06-24 NOTE — Progress Notes (Signed)
[place] Counselor/Therapist Progress Note  Patient ID: Sandra Montes, MRN: [id number],    Date: 06/24/2022  Time Spent: 8:33am - 9:34am : 61 minutes  Treatment Type: Individual Therapy  Reported Symptoms: Patient reported feeling frustrated, overwhelmed, and "mentally tired"  Mental Status Exam: Appearance:  Well Groomed     Behavior: Appropriate  Motor: Normal  Speech/Language:  Clear and Coherent  Affect: Tearful  Mood: Patient reported feeling frustrated  Thought process: normal  Thought content:   WNL  Sensory/Perceptual disturbances:   WNL  Orientation: oriented to person, place, and situation  Attention: Good  Concentration: Good  Memory: WNL  Fund of knowledge:  Good  Insight:   Good  Judgment:  Good  Impulse Control: Fair   Risk Assessment: Danger to Self:  No Patient denied current suicidal ideation Self-injurious Behavior: No Danger to Others: No Patient denied current homicidal ideation Duty to Warn:no Physical Aggression / Violence:No  Access to Firearms a concern: No  Gang Involvement:No   Subjective: Patient reported frustration related to a recent project at work and being relieved of that project after spending a significant amount of time on the project. Patient reported frustration related to work stress and reported she is unsure of what to do about the issue. Patient reported she did not get a raise due to her recent promotion and is upset about the situation. Patient reported she experiences difficulty standing up for herself and when she does stand up for herself she responds out of anger. Patient reported she does not want to allow herself to be consistently taken advantage of in the workplace. Patient reported she recently had a difficult conversation with her mother regarding household finances and her mother did not provide a definitive response to the conversation. Patient stated, "I feel like Im stuck" in response to the  financial situation with her mother. Patient stated, "that's the way it's always been" in response to the relationship with her mother. Patient stated, "I do what I have to do to keep the peace no matter how it makes me feel" and stated, "I'm at my wits end with it all". Patient reported she has made some financial changes recently in response to financial concerns and plans to make additional changes. Patient reported her mother uses her health to avoid difficult conversations with patient.    Interventions: Cognitive Behavioral Therapy. Clinician conducted session in person at clinician's office at [facility]. Provided active and reflective listening as patient discussed feelings of frustration related to her job and recent promotion. Explored alternative perspectives to the situation at work and explored patient's goals/priorities as it relates to finances versus job satisfaction. Explored barriers to patient advocating for herself and patient's response when feeling she is being taking advantage of in a situation. Discussed current financial stressors and patient's thoughts/feelings in response. Explored strategies to cope with financial stressors. Challenged cognitive distortions vocalized during session. Discussed having a conversation with mother's cardiologist regarding patient's concerns related to mother's report of cardiac symptoms in response to difficult conversations.   Diagnosis:  Generalized anxiety disorder   Severe episode of recurrent major depressive disorder, without psychotic features (HCC)   R/O Panic Disorder R/O Cannabis Use Disorder R/O Alcohol Use Disorder     Plan: Patient is to utilize Cognitive Behavioral Therapy, dialectical behavioral therapy skills, thought re-framing, relaxation techniques, positive self talk and coping strategies to decrease symptoms associated with Major Depressive Disorder and Generalized Anxiety Disorder.   Frequency: weekly  Modality:  individual  Long-term goal:   Learn and develop strategies to increase self care and make patient's mental and physical health a priority, such as, decrease in tobacco use, decrease in over spending behaviors   Target Date: 04/01/23  Progress: progressing    Short-term goal:  Identify triggers for feelings of anger and develop coping strategies to utilize in response to feelings of anger to prevent/reduce patient from "shutting down" in response to anger per patient's report    Target Date: 09/29/22  Progress: progressing    Develop effective communication strategies for patient to utilize when expressing her thoughts and feelings to others in a controlled and assertive way    Target Date: 09/29/22  Progress: progressing    Utilize effective communication strategies to establish healthy boundaries with patient's mother and her children to improve patient's relationship with her mother and children   Target Date: 09/29/22  Progress: progressing    Identify, challenge, and replace negative core beliefs/schema, thought patterns, and negative self talk that contribute to feelings of depression, anxiety, and low self esteem and replace with positive thoughts, beliefs, and positive self talk per patient's report   Target Date: 09/29/22  Progress: progressing     Katherina Right, LCSW

## 2022-06-28 ENCOUNTER — Other Ambulatory Visit: Payer: Self-pay | Admitting: Family

## 2022-06-28 DIAGNOSIS — I10 Essential (primary) hypertension: Secondary | ICD-10-CM

## 2022-06-29 NOTE — Telephone Encounter (Signed)
Refill request from the pharmacy- metoprolol succinate (TOPROL-XL) 50 MG 24 hr tablet . LV- 01/13/22; NV- not scheduled; LR- 04/03/22 ( 90 tabs/1 refill). Ok to to refill med?

## 2022-07-08 ENCOUNTER — Ambulatory Visit: Payer: 59 | Admitting: Clinical

## 2022-07-08 DIAGNOSIS — F411 Generalized anxiety disorder: Secondary | ICD-10-CM | POA: Diagnosis not present

## 2022-07-08 DIAGNOSIS — F332 Major depressive disorder, recurrent severe without psychotic features: Secondary | ICD-10-CM | POA: Diagnosis not present

## 2022-07-08 NOTE — Progress Notes (Signed)
                Lyda Colcord, LCSW 

## 2022-07-08 NOTE — Progress Notes (Signed)
[place] Counselor/Therapist Progress Note  Patient ID: Sandra Montes, MRN: [id number],    Date: 07/08/2022  Time Spent: 8:35am - 9:32am : 57 minutes   Treatment Type: Individual Therapy  Reported Symptoms: Patient reported feeling angry and resentful.   Mental Status Exam: Appearance:  Well Groomed     Behavior: Appropriate  Motor: Normal  Speech/Language:  Clear and Coherent  Affect: Tearful  Mood: angry  Thought process: normal  Thought content:   WNL  Sensory/Perceptual disturbances:   WNL  Orientation: oriented to person, place, and situation  Attention: Good  Concentration: Good  Memory: WNL  Fund of knowledge:  Good  Insight:   Fair  Judgment:  Good  Impulse Control: Fair   Risk Assessment: Danger to Self:  No Patient denied current suicidal ideation  Self-injurious Behavior: No Danger to Others: No Patient denied current homicidal ideation  Duty to Warn:no Physical Aggression / Violence:No  Access to Firearms a concern: No  Gang Involvement:No   Subjective: Patient reported she has resumed a previous work project that is a stressor. Patient reported her supervisor and director are aware of the project and that it was delegated to patient. Patient reported she plans to address her concerns related to her work compensation after the current project is complete. Patient reported her aunt arrived for an unexpected visit and patient identified aunt's visit as a stressor. Patient reported she stayed in her sunroom, left the room, or left the home in response to interactions with her aunt.  Patient reported during a recent conversation with her mother regarding events at the local senior center, patient's mother stated she does not want to make friends because she does not want to lose anyone else. Patient reported she is unsure how address the conversation with her mother and stated,  "I don't know what to do with it". Patient stated, "her constantly  having to be the victim" when clinician inquired to clarify. Patient stated, "I'm not doing my job" and reported she feels responsible for her mother's happiness. Patient reported she resents that she is the one that has always been there for her mother. Patient stated, "I'm angry", "I almost feel like I'm worthless if I'm not" in regards to patient taking care of others. Patient reported she feels she is expected to take care of others. Patient stated, "I had a slip this week" in response to stressors. Patient reported she went to a local casino and gambled. Patient stated, "I needed out of the house" and reported she was feeling overwhelmed. Patient stated,  "I felt like everybody's after thought" and stated, "the only time I feel important is when somebody needs me". Patient stated, "that's why I shop and I gamble, is to feel". Patient stated, "I don't know who I am". Patient reported she feels uncomfortable when experiencing positive emotions. Patient stated, "I need to self soothe in a positive way, not a negative way".   Interventions: Cognitive Behavioral Therapy. Clinician conducted session in person at clinician's office at [facility]. Reviewed events since last session, recent stressors, and status of stressors. Explored and identified coping strategies patient implemented in response to stressors. Processed patient's recent conversation with her mother. Discussed strategies to approach conversation and establish boundaries with her mother regarding socialization. Provided psycho education related to establishing boundaries. Explored and identified patient's thoughts associated with patient feeling responsible for her mother's happiness.  Discussed patient's response to recent stress and patient's decision to gamble at local casino. Explored and  identified triggers related to gambling. Explored and identified patient's thoughts as it relates to patient's happiness. Challenged cognitive  distortions vocalized during session. Provided active and reflective listening. Provided validation. Provided psycho education related to core beliefs. Clinician requested for homework patient complete positive qualities survey.     Diagnosis:  Generalized anxiety disorder   Severe episode of recurrent major depressive disorder, without psychotic features (HCC)   R/O Panic Disorder R/O Cannabis Use Disorder R/O Alcohol Use Disorder     Plan: Patient is to utilize Cognitive Behavioral Therapy, dialectical behavioral therapy skills, thought re-framing, relaxation techniques, positive self talk and coping strategies to decrease symptoms associated with Major Depressive Disorder and Generalized Anxiety Disorder.   Frequency: weekly  Modality: individual      Long-term goal:   Learn and develop strategies to increase self care and make patient's mental and physical health a priority, such as, decrease in tobacco use, decrease in over spending behaviors   Target Date: 04/01/23  Progress: progressing    Short-term goal:  Identify triggers for feelings of anger and develop coping strategies to utilize in response to feelings of anger to prevent/reduce patient from "shutting down" in response to anger per patient's report    Target Date: 09/29/22  Progress: progressing    Develop effective communication strategies for patient to utilize when expressing her thoughts and feelings to others in a controlled and assertive way    Target Date: 09/29/22  Progress: progressing    Utilize effective communication strategies to establish healthy boundaries with patient's mother and her children to improve patient's relationship with her mother and children   Target Date: 09/29/22  Progress: progressing    Identify, challenge, and replace negative core beliefs/schema, thought patterns, and negative self talk that contribute to feelings of depression, anxiety, and low self esteem and replace with  positive thoughts, beliefs, and positive self talk per patient's report   Target Date: 09/29/22  Progress: progressing    Katherina Right, LCSW

## 2022-07-15 ENCOUNTER — Ambulatory Visit: Payer: 59 | Admitting: Clinical

## 2022-07-15 DIAGNOSIS — F332 Major depressive disorder, recurrent severe without psychotic features: Secondary | ICD-10-CM | POA: Diagnosis not present

## 2022-07-15 DIAGNOSIS — F411 Generalized anxiety disorder: Secondary | ICD-10-CM | POA: Diagnosis not present

## 2022-07-15 NOTE — Progress Notes (Signed)
[place] Counselor/Therapist Progress Note  Patient ID: Sandra Montes, MRN: [id number],    Date: 07/15/2022  Time Spent: 8:33am - 9:32am : 59 minutes   Treatment Type: Individual Therapy  Reported Symptoms: Patient stated, "its been ok" in response to mood.   Mental Status Exam: Appearance:  Well Groomed     Behavior: Appropriate  Motor: Normal  Speech/Language:  Clear and Coherent  Affect: Appropriate  Mood: normal  Thought process: normal  Thought content:   WNL  Sensory/Perceptual disturbances:   WNL  Orientation: oriented to person, place, and situation  Attention: Good  Concentration: Good  Memory: WNL  Fund of knowledge:  Good  Insight:   Fair  Judgment:  Good  Impulse Control: Fair   Risk Assessment: Danger to Self:  No Patient denied current suicidal ideation  Self-injurious Behavior: No Danger to Others: No Patient denied current homicidal ideation  Duty to Warn:no Physical Aggression / Violence:No  Access to Firearms a concern: No  Gang Involvement:No   Subjective: Patient reported she took the day off of work after the last therapy session and took some time to perform self care.  Patient reported she asked ten individuals to complete the positive qualities survey: her children, several friends, her ex-husband, and a previous co-worker.  Patient stated, "I was shocked" in response to the feedback from others and reported feeling shocked that everyone completed the survey. Patient reported she did not ask her significant other to provide feedback for the survey and stated, "I don't know" in response to barriers to asking her significant other.  Patient reported multiple individuals provided the feedback that patient is determined, strong, and listed multiple positive qualities related to patient 's role as a mother. Patient reported she was surprised by her ex-husband's responses. Patient stated, "It felt good and I recognized myself in some of  it but not a lot of it" and stated, "its uncomfortable" in response to the survey. Patient reported her mother has started contributing the agreed upon amount for the household bills and patient reported she has paid off some debts. Patient stated, "I feel like I have a little more wiggle room" in response to financial stressors. Patient reported she has been put on a committee for the project at work that was a stressor.  Patient stated, "Its been ok" in response to patient's mood and reported recent change in weather and daylight hours has benefited her mood.    Interventions: Cognitive Behavioral Therapy. Clinician conducted session in person at clinician's office at [facility]. Reviewed events since last session. Reviewed patient's results of positive qualities survey and processed patient's thoughts/feelings in response to the responses she received from others while completing positive qualities survey. Explored barriers to patient asking her significant other to provide feedback for positive qualities survey. Reviewed status of financial stressors and work related stressors, and patient's thoughts/feelings in response. Discussed the impact of recent change in weather and daylight savings time on patient's mood. Provided validation.     Diagnosis:  Generalized anxiety disorder   Severe episode of recurrent major depressive disorder, without psychotic features (HCC)   R/O Panic Disorder R/O Cannabis Use Disorder R/O Alcohol Use Disorder     Plan: Patient is to utilize Cognitive Behavioral Therapy, dialectical behavioral therapy skills, thought re-framing, relaxation techniques, positive self talk and coping strategies to decrease symptoms associated with Major Depressive Disorder and Generalized Anxiety Disorder.   Frequency: weekly  Modality: individual      Long-term goal:  Learn and develop strategies to increase self care and make patient's mental and physical health a  priority, such as, decrease in tobacco use, decrease in over spending behaviors   Target Date: 04/01/23  Progress: progressing    Short-term goal:  Identify triggers for feelings of anger and develop coping strategies to utilize in response to feelings of anger to prevent/reduce patient from "shutting down" in response to anger per patient's report    Target Date: 09/29/22  Progress: progressing    Develop effective communication strategies for patient to utilize when expressing her thoughts and feelings to others in a controlled and assertive way    Target Date: 09/29/22  Progress: progressing    Utilize effective communication strategies to establish healthy boundaries with patient's mother and her children to improve patient's relationship with her mother and children   Target Date: 09/29/22  Progress: progressing    Identify, challenge, and replace negative core beliefs/schema, thought patterns, and negative self talk that contribute to feelings of depression, anxiety, and low self esteem and replace with positive thoughts, beliefs, and positive self talk per patient's report   Target Date: 09/29/22  Progress: progressing       Katherina Right, LCSW

## 2022-07-15 NOTE — Progress Notes (Signed)
                Tifini Reeder, LCSW 

## 2022-07-22 ENCOUNTER — Ambulatory Visit: Payer: 59 | Admitting: Clinical

## 2022-07-26 ENCOUNTER — Other Ambulatory Visit: Payer: Self-pay | Admitting: Family

## 2022-07-26 DIAGNOSIS — I10 Essential (primary) hypertension: Secondary | ICD-10-CM

## 2022-08-13 ENCOUNTER — Ambulatory Visit: Payer: 59 | Admitting: Clinical

## 2022-08-13 DIAGNOSIS — F332 Major depressive disorder, recurrent severe without psychotic features: Secondary | ICD-10-CM

## 2022-08-13 DIAGNOSIS — F411 Generalized anxiety disorder: Secondary | ICD-10-CM

## 2022-08-13 NOTE — Telephone Encounter (Signed)
Prescription Request  08/13/2022  LOV: 01/13/2022  What is the name of the medication or equipment? valsartan-hydrochlorothiazide (DIOVAN-HCT) 320-25 MG tablet &  metoprolol succinate (TOPROL-XL) 50 MG 24 hr tablet    Have you contacted your pharmacy to request a refill? Yes  Pharmacy sent refill request to our office   Which pharmacy would you like this sent to?  [id number] - [place], [place] - [address] AT [facility] [address] [facility] [address] [place] [address] Phone: [phone] Fax: [phone]    Patient notified that their request is being sent to the clinical staff for review and that they should receive a response within 2 business days.   Please advise at Mobile [phone] (mobile)

## 2022-08-13 NOTE — Progress Notes (Signed)
[facility] Counselor/Therapist Progress Note  Patient ID: Curtisha Wrice, MRN: [id number],    Date: 08/13/2022  Time Spent: 1:35pm - 2:34pm : 59 minutes  Treatment Type: Individual Therapy  Reported Symptoms: Patient reported depressed mood and feelings of anger  Mental Status Exam: Appearance:  Well Groomed     Behavior: Appropriate  Motor: Normal  Speech/Language:  Clear and Coherent  Affect: Appropriate  Mood: angry and depressed  Thought process: normal  Thought content:   WNL  Sensory/Perceptual disturbances:   WNL  Orientation: oriented to person, place, and situation  Attention: Good  Concentration: Good  Memory: WNL  Fund of knowledge:  Good  Insight:   Good  Judgment:  Good  Impulse Control: Fair   Risk Assessment: Danger to Self:  No Patient denied current suicidal ideation  Self-injurious Behavior: No Danger to Others: No Patient denied current homicidal ideation  Duty to Warn:no Physical Aggression / Violence:No  Access to Firearms a concern: No  Gang Involvement:No   Subjective: Patient reported 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 Patient stated, "I had my mental breakdown Friday after our last session". Patient reported on Friday she received an email regarding returning to working in the office five days per week.  Patient stated, "I'm not happy about it". Patient reported she would like to rebel against the change but has to support other staff. Patient reported she experiences difficulty not showing her reaction to the change in schedule. Patient stated, "I feel like my hands are tied all the way around me" and reported she can see the staff's point of view and company's point of view. Patient stated, "the pros still outweigh the cons where this job is concerned". Patient stated, "I'm always stressed in the car" and reported experiencing panic attacks while in the car. Patient reported she is  concerned about the increase in travel and concerned that she will call out of work or be tardy to work in response to the changes. Patient stated, "Lavenia Atlas had a huge issue with authority my whole life" and asked "where do you draw that line". Patient stated, "I think all of mine are very porous" in response to patient's boundaries. Patient reported her mood has been depressed and angry since last session and stated, "I've been a little more angry lately".   Interventions: Cognitive Behavioral Therapy and supportive therapy . Clinician conducted session in person at clinician's office at [facility]. Reviewed events since last session. Provided supportive therapy, active and reflective listening, and validation as patient discussed recent change in schedule at work and patient's response to the schedule change. Discussed strategies for patient to utilize to support other staff, such as, using empathy in conversations. Explored strategies for patient to advocate for herself and staff  in response to the change, such as, talking with human resources. Dicussed boundaries and explored patient's boundaries in regards to her current employment situation. Assessed patient's current mood and mood since last session. Clinician requested for homework patient write down her thoughts in regards to work related stressors and  pros/cons.      Diagnosis:  Generalized anxiety disorder   Severe episode of recurrent major depressive disorder, without psychotic features (HCC)   R/O Panic Disorder R/O Cannabis Use Disorder R/O Alcohol Use Disorder     Plan: Patient is to utilize Cognitive Behavioral Therapy, dialectical behavioral therapy skills, thought re-framing, relaxation techniques, positive self talk and coping strategies to decrease symptoms  associated with Major Depressive Disorder and Generalized Anxiety Disorder.   Frequency: weekly  Modality: individual      Long-term goal:   Learn and develop  strategies to increase self care and make patient's mental and physical health a priority, such as, decrease in tobacco use, decrease in over spending behaviors   Target Date: 04/01/23  Progress: progressing    Short-term goal:  Identify triggers for feelings of anger and develop coping strategies to utilize in response to feelings of anger to prevent/reduce patient from "shutting down" in response to anger per patient's report    Target Date: 09/29/22  Progress: progressing    Develop effective communication strategies for patient to utilize when expressing her thoughts and feelings to others in a controlled and assertive way    Target Date: 09/29/22  Progress: progressing    Utilize effective communication strategies to establish healthy boundaries with patient's mother and her children to improve patient's relationship with her mother and children   Target Date: 09/29/22  Progress: progressing    Identify, challenge, and replace negative core beliefs/schema, thought patterns, and negative self talk that contribute to feelings of depression, anxiety, and low self esteem and replace with positive thoughts, beliefs, and positive self talk per patient's report   Target Date: 09/29/22  Progress: progressing     Doree Barthel, LCSW

## 2022-08-13 NOTE — Progress Notes (Signed)
                Iracema Lanagan, LCSW 

## 2022-08-13 NOTE — Telephone Encounter (Signed)
Message sent to MyChart to have patient schedule an appt before medications can be refilled.

## 2022-08-13 NOTE — Telephone Encounter (Signed)
Will verify with Tabitha.

## 2022-08-14 ENCOUNTER — Telehealth: Payer: Self-pay | Admitting: Family

## 2022-08-14 DIAGNOSIS — I10 Essential (primary) hypertension: Secondary | ICD-10-CM

## 2022-08-14 NOTE — Telephone Encounter (Signed)
Refill for: metoprolol succinate (TOPROL-XL) 25 MG 24 hr tablet and Valsartan-hydrochlorothiazide (DIOVAN-HCT) 320-25 MG tablet   LR- 01/01/22 ( 90 tabs/ 1 refill) LV- 01/13/22 NV- 08/24/22

## 2022-08-14 NOTE — Telephone Encounter (Signed)
Prescription Request  08/14/2022  LOV: 01/13/2022  What is the name of the medication or equipment? valsartan-hydrochlorothiazide (DIOVAN-HCT) 320-25 MG tablet   metoprolol succinate (TOPROL-XL) 25 MG 24 hr tablet Have you contacted your pharmacy to request a refill? Yes   Which pharmacy would you like this sent to?  [id number] - [place], [place] - [address] AT [facility] [address] [facility] [address] [place] [address] Phone: [phone] Fax: [phone]   Patient made an appointment, but patient is out of medication  Patient notified that their request is being sent to the clinical staff for review and that they should receive a response within 2 business days.   Please advise at [facility] [phone]

## 2022-08-17 MED ORDER — METOPROLOL SUCCINATE ER 25 MG PO TB24: ORAL_TABLET | ORAL | 3 refills | Status: DC

## 2022-08-17 MED ORDER — VALSARTAN-HYDROCHLOROTHIAZIDE 320-25 MG PO TABS: 1.0000 | ORAL_TABLET | Freq: Every day | ORAL | 3 refills | Status: DC

## 2022-08-17 NOTE — Telephone Encounter (Signed)
Refill sent.

## 2022-08-24 ENCOUNTER — Ambulatory Visit: Payer: 59 | Admitting: Family

## 2022-08-25 ENCOUNTER — Other Ambulatory Visit: Payer: Self-pay | Admitting: Acute Care

## 2022-08-25 DIAGNOSIS — Z122 Encounter for screening for malignant neoplasm of respiratory organs: Secondary | ICD-10-CM

## 2022-08-25 DIAGNOSIS — Z87891 Personal history of nicotine dependence: Secondary | ICD-10-CM

## 2022-08-25 DIAGNOSIS — F17200 Nicotine dependence, unspecified, uncomplicated: Secondary | ICD-10-CM

## 2022-08-27 ENCOUNTER — Ambulatory Visit: Payer: 59 | Admitting: Clinical

## 2022-08-27 DIAGNOSIS — F411 Generalized anxiety disorder: Secondary | ICD-10-CM

## 2022-08-27 DIAGNOSIS — F332 Major depressive disorder, recurrent severe without psychotic features: Secondary | ICD-10-CM | POA: Diagnosis not present

## 2022-08-27 NOTE — Progress Notes (Signed)
[facility] Counselor/Therapist Progress Note  Patient ID: Tikisha Molinaro, MRN: [id number],    Date: 08/27/2022  Time Spent: 1:35pm - 2:31pm : 56 minutes  Treatment Type: Individual Therapy  Reported Symptoms: Patient reported recent feelings of anger  Mental Status Exam: Appearance:  Well Groomed     Behavior: Appropriate  Motor: Normal  Speech/Language:  Clear and Coherent  Affect: Appropriate  Mood: normal  Thought process: normal  Thought content:   WNL  Sensory/Perceptual disturbances:   WNL  Orientation: oriented to person, place, and situation  Attention: Good  Concentration: Good  Memory: WNL  Fund of knowledge:  Good  Insight:   Good  Judgment:  Good  Impulse Control: Fair   Risk Assessment: Danger to Self:  No Patient denied current suicidal ideation  Self-injurious Behavior: No Danger to Others: No Patient denied current homicidal ideation  Duty to Warn:no Physical Aggression / Violence:No  Access to Firearms a concern: No  Gang Involvement:No   Subjective: Patient stated, "the moral in the office is at an all time low" and reported her colleagues are not happy that they have to return to the office. Patient reported she is angry that when colleagues ask questions about their return to office there is no response from human resources. Patient reported staff had to declare their office hours and there are additional rules being put in place around returning to the office and schedules. Patient reported she feels she is giving the company an additional 80 minutes each day by getting ready to work and two hours of travel. Patient stated, "I'm concerned". Patient reported employees are provided seven remote days they can utilize per year and have specific parameters around those days. Patient reported she does not like the way her co-workers are responding to the change and stated, "I'm getting very angry at the people around me". Patient stated, "I  feel I'm kind of squished between my feelings about it and their feelings about it" and reported feeling she does not know how to support the staff. Patient stated, "its frustrating" in response to work related changes. Patient reported she made the decision on Saturday to pursue a self care regimen and went to a local store to learn a skin care routine. Patient reported on Sunday she established goals to wash her face, moisturize her face, and put on eye cream each night as a form of self care. Patient reported she has maintained that goal every night. Patient reported she downloaded an app called liven and is utilizing the app daily to monitor mood and establish daily goals. Patient reported she purchased a journal and has been writing down a joyous moment for each day. Patient reported she would like to quit smoking. Patient reported feeling excited today.   Interventions: Cognitive Behavioral Therapy and supportive therapy . Clinician conducted session in person at clinician's office at [facility]. Reviewed events since last session. Provided supportive therapy, active and reflective listening, and validation as patient discussed continued stress related to recent change in work schedule and patient's thoughts/feelings in response. Explored and identified triggers for recent feelings of anger. Explored strategies to address work related stress. Discussed recent self care strategies patient has implemented and the outcome. Validated patient's implementation of self care strategies. Provided psycho education related to building a new habit and breaking the habit of self care into steps. Clinician requested for homework patient continue to practice daily self care strategies.   Collaboration of Care: Other not required at  this time.     Diagnosis:  Generalized anxiety disorder   Severe episode of recurrent major depressive disorder, without psychotic features (HCC)   R/O Panic Disorder R/O  Cannabis Use Disorder R/O Alcohol Use Disorder     Plan: Patient is to utilize Cognitive Behavioral Therapy, dialectical behavioral therapy skills, thought re-framing, relaxation techniques, positive self talk and coping strategies to decrease symptoms associated with Major Depressive Disorder and Generalized Anxiety Disorder.   Frequency: weekly  Modality: individual      Long-term goal:   Learn and develop strategies to increase self care and make patient's mental and physical health a priority, such as, decrease in tobacco use, decrease in over spending behaviors   Target Date: 04/01/23  Progress: progressing    Short-term goal:  Identify triggers for feelings of anger and develop coping strategies to utilize in response to feelings of anger to prevent/reduce patient from "shutting down" in response to anger per patient's report    Target Date: 09/29/22  Progress: progressing    Develop effective communication strategies for patient to utilize when expressing her thoughts and feelings to others in a controlled and assertive way    Target Date: 09/29/22  Progress: progressing    Utilize effective communication strategies to establish healthy boundaries with patient's mother and her children to improve patient's relationship with her mother and children   Target Date: 09/29/22  Progress: progressing    Identify, challenge, and replace negative core beliefs/schema, thought patterns, and negative self talk that contribute to feelings of depression, anxiety, and low self esteem and replace with positive thoughts, beliefs, and positive self talk per patient's report   Target Date: 09/29/22  Progress: progressing       Doree Barthel, LCSW

## 2022-08-27 NOTE — Progress Notes (Signed)
                Selim Durden, LCSW 

## 2022-09-07 ENCOUNTER — Ambulatory Visit: Payer: 59 | Admitting: Family

## 2022-09-07 ENCOUNTER — Encounter: Payer: Self-pay | Admitting: Family

## 2022-09-07 ENCOUNTER — Ambulatory Visit
Admission: RE | Admit: 2022-09-07 | Discharge: 2022-09-07 | Disposition: A | Discharge status: Home / Self Care | Payer: 59 | Source: Ambulatory Visit | Attending: Family | Admitting: Family

## 2022-09-07 VITALS — BP 124/66 | HR 72 | Temp 98.7°F | Ht 64.0 in | Wt 211.2 lb

## 2022-09-07 DIAGNOSIS — R911 Solitary pulmonary nodule: Secondary | ICD-10-CM | POA: Insufficient documentation

## 2022-09-07 DIAGNOSIS — Z8249 Family history of ischemic heart disease and other diseases of the circulatory system: Secondary | ICD-10-CM | POA: Diagnosis not present

## 2022-09-07 DIAGNOSIS — D582 Other hemoglobinopathies: Secondary | ICD-10-CM

## 2022-09-07 DIAGNOSIS — N281 Cyst of kidney, acquired: Secondary | ICD-10-CM | POA: Diagnosis not present

## 2022-09-07 DIAGNOSIS — I10 Essential (primary) hypertension: Secondary | ICD-10-CM | POA: Diagnosis not present

## 2022-09-07 DIAGNOSIS — I700 Atherosclerosis of aorta: Secondary | ICD-10-CM | POA: Insufficient documentation

## 2022-09-07 DIAGNOSIS — J432 Centrilobular emphysema: Secondary | ICD-10-CM

## 2022-09-07 DIAGNOSIS — F17200 Nicotine dependence, unspecified, uncomplicated: Secondary | ICD-10-CM | POA: Insufficient documentation

## 2022-09-07 DIAGNOSIS — I2510 Atherosclerotic heart disease of native coronary artery without angina pectoris: Secondary | ICD-10-CM | POA: Insufficient documentation

## 2022-09-07 DIAGNOSIS — F17210 Nicotine dependence, cigarettes, uncomplicated: Secondary | ICD-10-CM | POA: Diagnosis not present

## 2022-09-07 DIAGNOSIS — Z122 Encounter for screening for malignant neoplasm of respiratory organs: Secondary | ICD-10-CM | POA: Insufficient documentation

## 2022-09-07 DIAGNOSIS — Z87891 Personal history of nicotine dependence: Secondary | ICD-10-CM

## 2022-09-07 DIAGNOSIS — J439 Emphysema, unspecified: Secondary | ICD-10-CM | POA: Insufficient documentation

## 2022-09-07 DIAGNOSIS — E785 Hyperlipidemia, unspecified: Secondary | ICD-10-CM | POA: Diagnosis not present

## 2022-09-07 MED ORDER — NICOTINE 21 MG/24HR TD PT24
21.0000 mg | MEDICATED_PATCH | Freq: Every day | TRANSDERMAL | 0 refills | Status: DC
Start: 2022-09-07 — End: 2022-11-23

## 2022-09-07 NOTE — Assessment & Plan Note (Signed)
Continue siolto  F/u for CT lung as scheduled.

## 2022-09-07 NOTE — Assessment & Plan Note (Signed)
Seen by hematology, unremarkable exam suspected due to cigarette smoking.

## 2022-09-07 NOTE — Patient Instructions (Addendum)
  Check out easy quit free smoking app which may be helpful.     Regards,   Mort Sawyers FNP-C

## 2022-09-07 NOTE — Progress Notes (Signed)
Established Patient Office Visit  Subjective:      CC:  Chief Complaint  Patient presents with   Hypertension    HPI: Sandra Montes is a 52 y.o. female presenting on 09/07/2022 for Hypertension . HTN: stable blood pressure today 124/66, on valsartan HCTZ 320/25 mg once daily. Also on metoprolol 75 mg XL   HLD: on atorvastatin 40 mg once daily.  Lab Results  Component Value Date   CHOL 137 01/13/2022   HDL 49.40 01/13/2022   LDLCALC 69 01/13/2022   TRIG 95.0 01/13/2022   CHOLHDL 3 01/13/2022   COPD: stiolto respimat , 2 puffs once daily.   Lab Results  Component Value Date   WBC 8.0 01/30/2022   HGB 17.2 (H) 01/30/2022   HCT 48.2 (H) 01/30/2022   MCV 87.8 01/30/2022   PLT 206 01/30/2022   Last metabolic panel Lab Results  Component Value Date   GLUCOSE 96 07/04/2021   NA 139 07/04/2021   K 4.0 07/04/2021   CL 98 07/04/2021   CO2 31 07/04/2021   BUN 12 07/04/2021   CREATININE 0.65 07/04/2021   CALCIUM 9.2 07/04/2021   PROT 6.8 07/04/2021   BILITOT 0.6 07/04/2021   AST 16 07/04/2021   ALT 16 07/04/2021    New complaints: Tender knot on her right thumb that started a few weeks ago. The tenderness is at the distal phalynx not improving. 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   Also arthritis bil knees, hips and ankles. She does have stiffness especially in her hands.  Abscess left inner groin. Has since started improving since popping the other night. Has seen gynecologist in the past. Is using clerasil as needed which has been helpful which was recommended by her dermatologist.   Yearly CT low dose chest was performed this am.  Pending results. Still a smoker, motivated to quit too much stress.  Cigarettes, about 25-30 daily.  Has tried hypnotism in the past not successful. Never tried nicotine patches.   Elevated hgn and hct, seen hematologist, dx due to oxygen deprivation due to smoking.    Social  history:  Relevant past medical, surgical, family and social history reviewed and updated as indicated. Interim medical history since our last visit reviewed.  Allergies and medications reviewed and updated.  DATA REVIEWED: CHART IN EPIC     ROS: Negative unless specifically indicated above in HPI.    Current Outpatient Medications:    albuterol (VENTOLIN HFA) 108 (90 Base) MCG/ACT inhaler, Inhale 2 puffs into the lungs every 4 (four) hours as needed for wheezing or shortness of breath., Disp: 18 g, Rfl: 0   atorvastatin (LIPITOR) 40 MG tablet, SMARTSIG:1 Tablet(s) By Mouth Every Evening, Disp: 90 tablet, Rfl: 1   B Complex Vitamins (VITAMIN B-COMPLEX) TABS, Take 1 tablet by mouth daily., Disp: , Rfl:    cetirizine (ZYRTEC) 10 MG tablet, Take by mouth., Disp: , Rfl:    Cinnamon 500 MG capsule, Take by mouth., Disp: , Rfl:    Magnesium Cl-Calcium Carbonate (SLOW MAGNESIUM/CALCIUM PO), Take by mouth., Disp: , Rfl:    metoprolol succinate (TOPROL-XL) 25 MG 24 hr tablet, Take one po qd along with 50 mg ER tablet for total of 75 mg daily, Disp: 90 tablet, Rfl: 3   metoprolol succinate (TOPROL-XL) 50 MG 24 hr tablet, TAKE 1 TABLET BY MOUTH EVERY DAY ALONG WITH 25MG  ER FOR A TOTAL OF 75MG  DAILY, Disp: 90 tablet, Rfl: 3  nicotine (NICODERM CQ - DOSED IN MG/24 HOURS) 21 mg/24hr patch, Place 1 patch (21 mg total) onto the skin daily., Disp: 28 patch, Rfl: 0   Omega-3 Fatty Acids (FISH OIL) 1000 MG CAPS, Take by mouth., Disp: , Rfl:    Tiotropium Bromide-Olodaterol (STIOLTO RESPIMAT) 2.5-2.5 MCG/ACT AERS, Inhale 2 puffs into the lungs daily., Disp: 1 each, Rfl: 11   Turmeric (QC TUMERIC COMPLEX) 500 MG CAPS, Take by mouth., Disp: , Rfl:    valsartan-hydrochlorothiazide (DIOVAN-HCT) 320-25 MG tablet, Take 1 tablet by mouth daily., Disp: 90 tablet, Rfl: 3      Objective:    BP 124/66   Pulse 72   Temp 98.7 F (37.1 C) (Temporal)   SpO2 97%   Wt Readings from Last 3 Encounters:  09/07/22  207 lb (93.9 kg)  06/03/22 214 lb (97.1 kg)  01/30/22 206 lb 12.8 oz (93.8 kg)    Physical Exam Constitutional:      General: She is not in acute distress.    Appearance: Normal appearance. She is normal weight. She is not ill-appearing, toxic-appearing or diaphoretic.  HENT:     Head: Normocephalic.  Cardiovascular:     Rate and Rhythm: Normal rate and regular rhythm.  Pulmonary:     Effort: Pulmonary effort is normal.  Musculoskeletal:        General: Normal range of motion.  Neurological:     General: No focal deficit present.     Mental Status: She is alert and oriented to person, place, and time. Mental status is at baseline.  Psychiatric:        Mood and Affect: Mood normal.        Behavior: Behavior normal.        Thought Content: Thought content normal.        Judgment: Judgment normal.            Assessment & Plan:  Cigarette smoker motivated to quit Assessment & Plan: Smoking cessation instruction/counseling given:  counseled patient on the dangers of tobacco use, advised patient to stop smoking, and reviewed strategies to maximize success Rx nicotine patch 21 mg  Pt to apply daily and try to quit cold [place]  Pt to f/u in four weeks to reevaluate   Orders: -     Nicotine; Place 1 patch (21 mg total) onto the skin daily.  Dispense: 28 patch; Refill: 0 -     Ambulatory referral to Cardiology  Family history of cardiac arrest Assessment & Plan: Referral placed for cardiologist due to strong fmh with personal h/o comordibities, smoking and htn   Orders: -     Ambulatory referral to Cardiology  Dyslipidemia -     Ambulatory referral to Cardiology  Essential hypertension -     Ambulatory referral to Cardiology  Centrilobular emphysema [facility]) Assessment & Plan: Continue siolto  F/u for CT lung as scheduled.   Elevated hemoglobin (HCC) Assessment & Plan: Seen by hematology, unremarkable exam suspected due to cigarette smoking.      Return in  about 4 weeks (around 10/05/2022) for smoking cessation f/u and CPE, can be in june 2024.  Mort Sawyers, MSN, APRN, FNP-C [facility]

## 2022-09-07 NOTE — Assessment & Plan Note (Signed)
Smoking cessation instruction/counseling given:  counseled patient on the dangers of tobacco use, advised patient to stop smoking, and reviewed strategies to maximize success Rx nicotine patch 21 mg  Pt to apply daily and try to quit cold [place]  Pt to f/u in four weeks to reevaluate

## 2022-09-07 NOTE — Assessment & Plan Note (Signed)
Referral placed for cardiologist due to strong fmh with personal h/o comordibities, smoking and htn

## 2022-09-10 ENCOUNTER — Ambulatory Visit: Payer: 59 | Admitting: Clinical

## 2022-09-10 ENCOUNTER — Other Ambulatory Visit: Payer: Self-pay

## 2022-09-10 DIAGNOSIS — F411 Generalized anxiety disorder: Secondary | ICD-10-CM | POA: Diagnosis not present

## 2022-09-10 DIAGNOSIS — F332 Major depressive disorder, recurrent severe without psychotic features: Secondary | ICD-10-CM | POA: Diagnosis not present

## 2022-09-10 DIAGNOSIS — F17200 Nicotine dependence, unspecified, uncomplicated: Secondary | ICD-10-CM

## 2022-09-10 DIAGNOSIS — Z87891 Personal history of nicotine dependence: Secondary | ICD-10-CM

## 2022-09-10 NOTE — Progress Notes (Signed)
                Anfernee Peschke, LCSW 

## 2022-09-10 NOTE — Progress Notes (Signed)
[facility] Counselor/Therapist Progress Note  Patient ID: Sandra Montes, MRN: [id number],    Date: 09/10/2022  Time Spent: 1:31pm - 2:33pm : 62 minutes   Treatment Type: Individual Therapy  Reported Symptoms: Patient reported feeling exhausted  Mental Status Exam: Appearance:  Well Groomed     Behavior: Appropriate  Motor: Normal  Speech/Language:  Clear and Coherent  Affect: Appropriate  Mood: Patient reported feeling exhausted  Thought process: normal  Thought content:   WNL  Sensory/Perceptual disturbances:   WNL  Orientation: oriented to person, place, and situation  Attention: Good  Concentration: Good  Memory: WNL  Fund of knowledge:  Good  Insight:   Good  Judgment:  Good  Impulse Control: Fair   Risk Assessment: Danger to Self:  No Patient denied current suicidal ideation  Self-injurious Behavior: No Danger to Others: No Patient denied current homicidal ideation Duty to Warn:no Physical Aggression / Violence:No  Access to Firearms a concern: No  Gang Involvement:No   Subjective: Patient stated, "I'm exhausted" and reported exhaustion is due to office hours and amount of travel. Patient reported multiple work related stressors have occurred this week. Patient reported shifts in work related activities this week. Patient reported the moral at her place of employment is low and employees are responding to recent schedule change in an unprofessional manner. Patient reported she is conflicted in her feelings related to work. 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  Patient reported conflictual feelings regarding staff leaving the company. Patient stated, "I don't want their attitude and feelings on things to affect my attitude". Patient stated, "I try the 555 rule" "and "if its not I can usually  let it go". Patient stated, "I have done very good" in regards to implementation of self care strategies. Patient stated, "the journaling hasn't gone as well as I wanted it to". Patient reported she met with her primary care provider who prescribed patient nicotine patches. Patient reported she took extra [facility] gummies one day and reported she wanted to relax. Patient stated, "I'm exhausted and on a roller coaster right now" in response to mood.   Interventions: Cognitive Behavioral Therapy and Interpersonal. Clinician conducted session in person at clinician's office at [facility]. Reviewed events since last session. Provided supportive therapy, active and reflective listening, and validation as patient discussed continued work related stress and patient's thoughts/feelings in response. Discussed patient's internal conflict regarding her thoughts/feelings related to her employer, her employer's recent decision regarding employees return to working in the office, and patient's response as a Production designer, theatre/television/film to employees. Explored strategies to approach conversations with employees regarding recent changes in the workplace. Provided psycho education related to cognitive restructuring  and utilizing cognitive restructuring in regards to her conflictual thoughts/feelings related to staff leaving. Discussed the use of empathy, reflective listening, and validation during conversations with staff. Discussed coping strategies for patient to utilize in response to work related stress, such as, taking a time out and use of positive self talk. Discussed status of self care strategies patient has implemented.  Explored triggers for recent use of THC gummies. Clinician requested for homework patient continue to practice daily self care strategies.   Collaboration of Care: Other not required at this time.      Diagnosis:  Generalized anxiety disorder   Severe episode of recurrent major depressive disorder, without  psychotic  features (HCC)   R/O Panic Disorder R/O Cannabis Use Disorder R/O Alcohol Use Disorder     Plan: Patient is to utilize Cognitive Behavioral Therapy, dialectical behavioral therapy skills, thought re-framing, relaxation techniques, positive self talk and coping strategies to decrease symptoms associated with Major Depressive Disorder and Generalized Anxiety Disorder.   Frequency: weekly  Modality: individual      Long-term goal:   Learn and develop strategies to increase self care and make patient's mental and physical health a priority, such as, decrease in tobacco use, decrease in over spending behaviors   Target Date: 04/01/23  Progress: progressing    Short-term goal:  Identify triggers for feelings of anger and develop coping strategies to utilize in response to feelings of anger to prevent/reduce patient from "shutting down" in response to anger per patient's report    Target Date: 09/29/22  Progress: progressing    Develop effective communication strategies for patient to utilize when expressing her thoughts and feelings to others in a controlled and assertive way    Target Date: 09/29/22  Progress: progressing    Utilize effective communication strategies to establish healthy boundaries with patient's mother and her children to improve patient's relationship with her mother and children   Target Date: 09/29/22  Progress: progressing    Identify, challenge, and replace negative core beliefs/schema, thought patterns, and negative self talk that contribute to feelings of depression, anxiety, and low self esteem and replace with positive thoughts, beliefs, and positive self talk per patient's report   Target Date: 09/29/22  Progress: progressing         Doree Barthel, LCSW

## 2022-09-14 DIAGNOSIS — Z8659 Personal history of other mental and behavioral disorders: Secondary | ICD-10-CM

## 2022-09-14 HISTORY — DX: Personal history of other mental and behavioral disorders: Z86.59

## 2022-09-24 ENCOUNTER — Ambulatory Visit: Payer: 59 | Admitting: Clinical

## 2022-09-24 DIAGNOSIS — F411 Generalized anxiety disorder: Secondary | ICD-10-CM | POA: Diagnosis not present

## 2022-09-24 DIAGNOSIS — F332 Major depressive disorder, recurrent severe without psychotic features: Secondary | ICD-10-CM | POA: Diagnosis not present

## 2022-09-24 NOTE — Progress Notes (Signed)
                Jasma Seevers, LCSW 

## 2022-09-24 NOTE — Progress Notes (Signed)
[facility] Counselor/Therapist Progress Note  Patient ID: Sandra Montes, MRN: [id number],    Date: 09/24/2022  Time Spent: 8:34am - 9:36am : 62 minutes   Treatment Type: Individual Therapy  Reported Symptoms: Patient reported feeling angry  Mental Status Exam: Appearance:  Well Groomed     Behavior: Appropriate  Motor: Normal  Speech/Language:  Clear and Coherent  Affect: Tearful  Mood: angry  Thought process: normal  Thought content:   WNL  Sensory/Perceptual disturbances:   WNL  Orientation: oriented to person, place, and situation  Attention: Good  Concentration: Good  Memory: WNL  Fund of knowledge:  Good  Insight:   Good  Judgment:  Good  Impulse Control: Fair   Risk Assessment: Danger to Self:  No Patient denied current suicidal ideation  Self-injurious Behavior: No Danger to Others: No Patient denied current homicidal ideation Duty to Warn:no Physical Aggression / Violence:No  Access to Firearms a concern: No  Gang Involvement:No   Subjective: Patient reported she is not feeling well today.  Patient stated, "I'm kind of all over the place" in regards to patient's mood. Patient reported her daughter is unhappy in her marriage and daughter plans to leave her husband. Patient reported she was asked to prepare a "go bag" for her daughter and has been asked to research legal resources. Patient reported she is angry with her daughter in regards to the situation and is concerned about the impact on her grandchildren. Patient reported her daughter has a history of instability in mood, instability in relationships, and a history of manipulation.  Patient reported her daughter has a historical diagnosis of bipolar disorder. Patient reported her daughter has not reached out to patient and is reaching out to others in regards to the situation. Patient reported she questions if her daughter is modeling behaviors her daughter observed patient display during  patient's marriage to her daughter's father. Patient stated,  "I don't know how to help her". Patient reported she is aware she needs to set boundaries with her daughter and reinforce those boundaries. Patient reported feelings of  guilt regarding the birth of her daughter. Patient reported feeling at fault for her daughter's situation. Patient reported she plans to spend more time with her grandchildren in response to the situation.   Interventions: Cognitive Behavioral Therapy, Interpersonal, and supportive therapy.  Clinician conducted session in person at clinician's office at [facility]. Reviewed events since last session. Provided supportive therapy, active and reflective listening, and validation as patient discussed current situation related to patient's daughter, daughter's marriage, and patient's thoughts/feelings regarding the situation. Provided psycho education related to bipolar disorder and borderline personality disorder. Provided psycho education related to establishing and maintaining healthy boundaries. Discussed resources for patient to contact, such as, 911 or Child Protective Services if patient becomes concerned about daughter or grandchildren's safety in the future. Explored potential strategies for patient to utilize in response to the situation with her daughter. Challenged cognitive distortions and provided psycho education related to cognitive restructuring. Clinician requested for homework patient complete thought record and use socratic questions to challenge thoughts.    Collaboration of Care: Other not required at this time.      Diagnosis:  Generalized anxiety disorder   Severe episode of recurrent major depressive disorder, without psychotic features (HCC)   R/O Panic Disorder R/O Cannabis Use Disorder R/O Alcohol Use Disorder     Plan: Patient is to utilize Cognitive Behavioral Therapy, dialectical behavioral therapy skills, thought re-framing, relaxation  techniques, positive  self talk and coping strategies to decrease symptoms associated with Major Depressive Disorder and Generalized Anxiety Disorder.   Frequency: weekly  Modality: individual      Long-term goal:   Learn and develop strategies to increase self care and make patient's mental and physical health a priority, such as, decrease in tobacco use, decrease in over spending behaviors   Target Date: 04/01/23  Progress: progressing    Short-term goal:  Identify triggers for feelings of anger and develop coping strategies to utilize in response to feelings of anger to prevent/reduce patient from "shutting down" in response to anger per patient's report    Target Date: 09/29/22  Progress: progressing    Develop effective communication strategies for patient to utilize when expressing her thoughts and feelings to others in a controlled and assertive way    Target Date: 09/29/22  Progress: progressing    Utilize effective communication strategies to establish healthy boundaries with patient's mother and her children to improve patient's relationship with her mother and children   Target Date: 09/29/22  Progress: progressing    Identify, challenge, and replace negative core beliefs/schema, thought patterns, and negative self talk that contribute to feelings of depression, anxiety, and low self esteem and replace with positive thoughts, beliefs, and positive self talk per patient's report   Target Date: 09/29/22  Progress: progressing       Doree Barthel, LCSW

## 2022-10-01 ENCOUNTER — Ambulatory Visit: Payer: 59 | Admitting: Clinical

## 2022-10-01 DIAGNOSIS — F332 Major depressive disorder, recurrent severe without psychotic features: Secondary | ICD-10-CM

## 2022-10-01 DIAGNOSIS — F411 Generalized anxiety disorder: Secondary | ICD-10-CM | POA: Diagnosis not present

## 2022-10-01 NOTE — Progress Notes (Signed)
                Burdette Forehand, LCSW 

## 2022-10-01 NOTE — Progress Notes (Signed)
[facility] Counselor/Therapist Progress Note  Patient ID: Sandra Montes, MRN: [id number],    Date: 10/01/2022  Time Spent: 8:36am - 9:32am : 56 minutes   Treatment Type: Individual Therapy  Reported Symptoms:  Patient reported experiencing anxiety when commuting to work  Mental Status Exam: Appearance:  Well Groomed     Behavior: Appropriate  Motor: Normal  Speech/Language:  Clear and Coherent  Affect: Appropriate  Mood: normal  Thought process: normal  Thought content:   WNL  Sensory/Perceptual disturbances:   WNL  Orientation: oriented to person, place, and situation  Attention: Good  Concentration: Good  Memory: WNL  Fund of knowledge:  Good  Insight:   Good  Judgment:  Good  Impulse Control: Fair   Risk Assessment: Danger to Self:  No Patient denied current suicidal ideation  Self-injurious Behavior: No Danger to Others: No Patient denied current homicidal ideation  Duty to Warn:no Physical Aggression / Violence:No  Access to Firearms a concern: No  Gang Involvement:No   Subjective: Patient reported she did not search for an attorney for her daughter and has decided not to pay for her granddaughter's birthday party. Patient stated, "I've chosen to sit back and see what happens" in regards to her daughter's situation. Patient reported she currently feels she needs permission to reach out to her daughter. Patient stated, "I'm tired of worrying about it" in regards to her daughter's situation. 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 Patient stated, "it makes me an anxious mess" in regards to patient's commute. Patient reported she considers taking PTO each day due to the commute to work. [place]. Patient reported when making a decision she considers if the outcome of the decision will move patient closer to her goal to  [place]. Patient reported her goal to [place] the mountains has contributed to a decrease in spending and patient identified a financial benefit if she discontinues tobacco use. Patient stated, "I'm alright" in response to current mood.  Interventions: Cognitive Behavioral Therapy and supportive therapy . Clinician conducted session in person at clinician's office at [facility]. Reviewed events since last session. Discussed patient's thoughts and feelings regarding patient's relationship with her daughter and patient's response. Discussed patient's decision and motivation to change how she responds to her daughter and establish boundaries with her daughter. Discussed the status of work related stressors and explored patient's motivation to refrain from using her PTO. Discussed patient incorporating patient's source of motivation into additional goals, such as, finances and patient's goal to discontinue tobacco use. Provided supportive therapy, active and reflective listening as patient discussed current financial stressors. Discussed patient utilizing a visual representation of her goal regarding the mountains. Clinician requested for homework patient complete thought record and use socratic questions to challenge thoughts.    Collaboration of Care: Other not required at this time.      Diagnosis:  Generalized anxiety disorder   Severe episode of recurrent major depressive disorder, without psychotic features (HCC)   R/O Panic Disorder R/O Cannabis Use Disorder R/O Alcohol Use Disorder     Plan: Patient is to utilize Cognitive Behavioral Therapy, dialectical behavioral therapy skills, thought re-framing, relaxation techniques, positive self talk and coping strategies to decrease symptoms associated with Major Depressive Disorder and Generalized Anxiety Disorder.   Frequency: weekly  Modality: individual      Long-term  goal:   Learn and develop strategies to increase  self care and make patient's mental and physical health a priority, such as, decrease in tobacco use, decrease in over spending behaviors   Target Date: 04/01/23  Progress: progressing    Short-term goal:  Identify triggers for feelings of anger and develop coping strategies to utilize in response to feelings of anger to prevent/reduce patient from "shutting down" in response to anger per patient's report    Target Date: 09/29/22  Progress: progressing    Develop effective communication strategies for patient to utilize when expressing her thoughts and feelings to others in a controlled and assertive way    Target Date: 09/29/22  Progress: progressing    Utilize effective communication strategies to establish healthy boundaries with patient's mother and her children to improve patient's relationship with her mother and children   Target Date: 09/29/22  Progress: progressing    Identify, challenge, and replace negative core beliefs/schema, thought patterns, and negative self talk that contribute to feelings of depression, anxiety, and low self esteem and replace with positive thoughts, beliefs, and positive self talk per patient's report   Target Date: 09/29/22  Progress: progressing     Doree Barthel, LCSW

## 2022-10-08 ENCOUNTER — Ambulatory Visit: Payer: 59 | Admitting: Clinical

## 2022-10-08 DIAGNOSIS — F411 Generalized anxiety disorder: Secondary | ICD-10-CM

## 2022-10-08 DIAGNOSIS — F332 Major depressive disorder, recurrent severe without psychotic features: Secondary | ICD-10-CM

## 2022-10-08 NOTE — Progress Notes (Signed)
                Mabeline Varas, LCSW 

## 2022-10-08 NOTE — Progress Notes (Signed)
[facility] Counselor/Therapist Progress Note  Patient ID: Sandra Montes, MRN: [id number],    Date: 10/08/2022  Time Spent: 8:34am - 9:34am : 60 minutes   Treatment Type: Individual Therapy  Reported Symptoms: none reported  Mental Status Exam: Appearance:  Neat and Well Groomed     Behavior: Appropriate  Motor: Normal  Speech/Language:  Clear and Coherent  Affect: Appropriate  Mood: normal  Thought process: normal  Thought content:   WNL  Sensory/Perceptual disturbances:   WNL  Orientation: oriented to person, place, and situation  Attention: Good  Concentration: Good  Memory: WNL  Fund of knowledge:  Good  Insight:   Good  Judgment:  Good  Impulse Control: Fair   Risk Assessment: Danger to Self:  No Patient denied current suicidal ideation  Self-injurious Behavior: No Danger to Others: No Patient denied current homicidal ideation Duty to Warn:no Physical Aggression / Violence:No  Access to Firearms a concern: No  Gang Involvement:No   Subjective: Patient reported she has been invited to have 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 Patient reported she plans to attend the breakfast and answer any questions asked. Patient reported a recent two hour conversation with one of her daughters. Patient reported she asked her daughter why her daughter "personally attacks" patient each time patient asks her daughter a question. Patient reported her son indicated that he feels patient and daughter are "too much alike" and "rub each other the wrong way" in regards to patient's daughter's response to questions. Patient reported during her conversation with her daughter, patient's daughter disclosed that when patient's children discuss their childhood the topic of conversation is their father. Patient reported her daughter stated, any trauma they (patient's children) have from their childhood does not have anything to do with patient. Patient reported she felt the recent  conversations with her children were "a good conversation" and stated,  "it was nice to hear". Patient reported she previously expected everyone to treat patient the way she treats everyone else and as a result had to change her expectations of others.    Interventions: Cognitive Behavioral Therapy and Interpersonal.  Clinician conducted session in person at clinician's office at [facility]. Reviewed events since last session. Discussed patient's thoughts/feelings in response to invitation to have breakfast with her company's CEO. Provided supportive therapy, active and reflective listening as patient discussed recent conversation with her daughter and with daughter/son. Discussed patient's thoughts/feelings in response to the conversation with her daughter and with her daughter/son regarding their childhood. Explored patient's change in expectations as it relates to relationships and motivators for changes. Clinician requested for homework patient continue thought record and use socratic questions to challenge thoughts.     Collaboration of Care: Other not required at this time.      Diagnosis:  Generalized anxiety disorder   Severe episode of recurrent major depressive disorder, without psychotic features (HCC)   R/O Panic Disorder R/O Cannabis Use Disorder R/O Alcohol Use Disorder     Plan: Patient is to utilize Cognitive Behavioral Therapy, dialectical behavioral therapy skills, thought re-framing, relaxation techniques, positive self talk and coping strategies to decrease symptoms associated with Major Depressive Disorder and Generalized Anxiety Disorder.   Frequency: weekly  Modality: individual      Long-term goal:   Learn and develop strategies to increase self care and make patient's mental and physical health a priority, such as, decrease in tobacco use, decrease in over spending behaviors   Target Date: 04/01/23  Progress: progressing  Short-term goal:  Identify  triggers for feelings of anger and develop coping strategies to utilize in response to feelings of anger to prevent/reduce patient from "shutting down" in response to anger per patient's report    Target Date: 09/29/22  Progress: progressing    Develop effective communication strategies for patient to utilize when expressing her thoughts and feelings to others in a controlled and assertive way    Target Date: 09/29/22  Progress: progressing    Utilize effective communication strategies to establish healthy boundaries with patient's mother and her children to improve patient's relationship with her mother and children   Target Date: 09/29/22  Progress: progressing    Identify, challenge, and replace negative core beliefs/schema, thought patterns, and negative self talk that contribute to feelings of depression, anxiety, and low self esteem and replace with positive thoughts, beliefs, and positive self talk per patient's report   Target Date: 09/29/22  Progress: progressing     Doree Barthel, LCSW

## 2022-10-11 ENCOUNTER — Encounter: Payer: Self-pay | Admitting: Emergency Medicine

## 2022-10-11 ENCOUNTER — Ambulatory Visit
Admission: EM | Admit: 2022-10-11 | Discharge: 2022-10-11 | Disposition: A | Discharge status: Home / Self Care | Payer: 59

## 2022-10-11 DIAGNOSIS — M25461 Effusion, right knee: Secondary | ICD-10-CM

## 2022-10-11 NOTE — ED Provider Notes (Signed)
Sandra Montes    CSN: [id number] Arrival date & time: 10/11/22  1136      History   Chief Complaint Chief Complaint  Patient presents with  . Knee Pain    HPI Sandra Montes is a 52 y.o. female.    Knee Pain   Patient presents to urgent care with complaint of 1 week history of right knee pain and swelling.  She denies any known injury or precipitating event but does report history of arthritis in the knee previously evaluated at [facility].  She is using Tylenol for pain relief.  Past Medical History:  Diagnosis Date  . HLD (hyperlipidemia)   . Hypertension     Patient Active Problem List   Diagnosis Date Noted  . Family history of cardiac arrest 09/07/2022  . Cigarette smoker motivated to quit 09/07/2022  . Erythrocytosis 01/30/2022  . Elevated hemoglobin (HCC) 01/13/2022  . History of colon polyps 01/13/2022  . Hot flushes, perimenopausal 01/13/2022  . Anxiety state 01/13/2022  . Polyp of colon   . Irritable bowel syndrome with diarrhea 10/13/2021  . Centrilobular emphysema (HCC) 07/04/2021  . Non-seasonal allergic rhinitis due to pollen 07/04/2021  . Dyslipidemia 12/29/2018  . Essential hypertension 12/29/2018  . Tobacco abuse 12/29/2018    Past Surgical History:  Procedure Laterality Date  . CESAREAN SECTION     1993  . CHOLECYSTECTOMY     1996  . COLONOSCOPY WITH PROPOFOL N/A 11/18/2021   Procedure: COLONOSCOPY WITH PROPOFOL;  Surgeon: Toney Reil, MD;  Location: [facility] ENDOSCOPY;  Service: Gastroenterology;  Laterality: N/A;  . TUBAL LIGATION     1995    OB History   No obstetric history on file.      Home Medications    Prior to Admission medications   Medication Sig Start Date End Date Taking? Authorizing Provider  albuterol (VENTOLIN HFA) 108 (90 Base) MCG/ACT inhaler Inhale 2 puffs into the lungs every 4 (four) hours as needed for wheezing or shortness of breath. 06/03/22   Eden Emms, NP  atorvastatin (LIPITOR) 40  MG tablet SMARTSIG:1 Tablet(s) By Mouth Every Evening 03/03/22   Mort Sawyers, FNP  B Complex Vitamins (VITAMIN B-COMPLEX) TABS Take 1 tablet by mouth daily.    [provider]  cetirizine (ZYRTEC) 10 MG tablet Take by mouth.    [provider]  Cinnamon 500 MG capsule Take by mouth.    [provider]  Magnesium Cl-Calcium Carbonate (SLOW MAGNESIUM/CALCIUM PO) Take by mouth.    [provider]  metoprolol succinate (TOPROL-XL) 25 MG 24 hr tablet Take one po qd along with 50 mg ER tablet for total of 75 mg daily 08/17/22   Mort Sawyers, FNP  metoprolol succinate (TOPROL-XL) 50 MG 24 hr tablet TAKE 1 TABLET BY MOUTH EVERY DAY ALONG WITH 25MG  ER FOR A TOTAL OF 75MG  DAILY 06/29/22   Mort Sawyers, FNP  nicotine (NICODERM CQ - DOSED IN MG/24 HOURS) 21 mg/24hr patch Place 1 patch (21 mg total) onto the skin daily. 09/07/22   Mort Sawyers, FNP  Omega-3 Fatty Acids (FISH OIL) 1000 MG CAPS Take by mouth.    [provider]  Tiotropium Bromide-Olodaterol (STIOLTO RESPIMAT) 2.5-2.5 MCG/ACT AERS Inhale 2 puffs into the lungs daily. 11/26/21   Hunsucker, Lesia Sago, MD  Turmeric (QC TUMERIC COMPLEX) 500 MG CAPS Take by mouth.    [provider]  valsartan-hydrochlorothiazide (DIOVAN-HCT) 320-25 MG tablet Take 1 tablet by mouth daily. 08/17/22   Mort Sawyers, FNP  Family History Family History  Problem Relation Age of Onset  . Alcohol abuse Mother   . Heart attack Mother 17  . Skin cancer Father   . Alcohol abuse Father   . Prostate cancer Father   . Prostate cancer Paternal Grandfather   . Skin cancer Paternal Grandfather   . Asthma Daughter     Social History Social History   Tobacco Use  . Smoking status: Every Day    Packs/day: 1.50    Years: 37.00    Additional pack years: 0.00    Total pack years: 55.50    Types: Cigarettes  . Smokeless tobacco: Never  . Tobacco comments:    Pt state 1-1.5 ppd 08/20/21  Vaping Use  . Vaping  Use: Never used  Substance Use Topics  . Alcohol use: Not Currently    Comment: h/o alcoholism quit 2017  . Drug use: Yes    Types: Marijuana    Comment: edibles last time 11/16/2021     Allergies   Buspar [buspirone], Lisinopril, Morphine, and Sulfa antibiotics   Review of Systems Review of Systems   Physical Exam Triage Vital Signs ED Triage Vitals  Enc Vitals Group     BP 10/11/22 1148 (!) 160/96     Pulse Rate 10/11/22 1148 60     Resp 10/11/22 1148 16     Temp 10/11/22 1148 98.6 F (37 C)     Temp Source 10/11/22 1148 Oral     SpO2 10/11/22 1148 96 %     Weight --      Height --      Head Circumference --      Peak Flow --      Pain Score 10/11/22 1149 4     Pain Loc --      Pain Edu? --      Excl. in GC? --    No data found.  Updated Vital Signs BP (!) 160/96 (BP Location: Left Arm) Comment: Pt haven't taken bp medication today  Pulse 60   Temp 98.6 F (37 C) (Oral)   Resp 16   SpO2 96%   Visual Acuity Right Eye Distance:   Left Eye Distance:   Bilateral Distance:    Right Eye Near:   Left Eye Near:    Bilateral Near:     Physical Exam Vitals reviewed.  Constitutional:      Appearance: Normal appearance.  Musculoskeletal:        General: Normal range of motion.     Right knee: Swelling and effusion present. No erythema. Tenderness present over the medial joint line and lateral joint line.  Skin:    General: Skin is warm and dry.  Neurological:     General: No focal deficit present.     Mental Status: She is alert and oriented to person, place, and time.  Psychiatric:        Mood and Affect: Mood normal.        Behavior: Behavior normal.     UC Treatments / Results  Labs (all labs ordered are listed, but only abnormal results are displayed) Labs Reviewed - No data to display  EKG   Radiology No results found.  Procedures Procedures (including critical care time)  Medications Ordered in UC Medications - No data to  display  Initial Impression / Assessment and Plan / UC Course  I have reviewed the triage vital signs and the nursing notes.  Pertinent labs & imaging results that were available  during my care of the patient were reviewed by me and considered in my medical decision making (see chart for details).   Sandra Montes is a 52 y.o. female presenting with R knee pain and swelling. Patient is afebrile without recent antipyretics, satting well on room air. Overall is well appearing though non-toxic, well hydrated, without respiratory distress.  Right knee is erythematous with likely presence of effusion.  There is tenderness at the medial and lateral.  Reviewed relevant chart history.   Recommended patient be evaluated by an orthopedic provider who can order appropriate imaging and treat her symptoms effectively and safely.  Counseled patient on potential for adverse effects with medications prescribed/recommended today, ER and return-to-clinic precautions discussed, patient verbalized understanding and agreement with care plan.  Final Clinical Impressions(s) / UC Diagnoses   Final diagnoses:  Effusion of right knee     Discharge Instructions      Recommend evaluation by an orthopedic provider for possible effusion of right knee.  I have provided contact information for [facility].  Alternatively, you can ask your primary care provider for a referral.       ED Prescriptions   None    PDMP not reviewed this encounter.   Charma Igo, [place] 10/11/22 1203

## 2022-10-11 NOTE — Discharge Instructions (Signed)
Recommend evaluation by an orthopedic provider for possible effusion of right knee.  I have provided contact information for [facility].  Alternatively, you can ask your primary care provider for a referral.

## 2022-10-11 NOTE — ED Triage Notes (Signed)
Pt presents with right knee pain and swelling x 1 week. Pt denies any injury, but does report having arthritis in that knee. She has tried tylenol for pain relief.

## 2022-10-20 ENCOUNTER — Ambulatory Visit (INDEPENDENT_AMBULATORY_CARE_PROVIDER_SITE_OTHER): Payer: 59 | Admitting: Family

## 2022-10-20 ENCOUNTER — Encounter: Payer: Self-pay | Admitting: Family

## 2022-10-20 VITALS — BP 130/62 | HR 67 | Temp 97.4°F | Ht 64.0 in | Wt 208.0 lb

## 2022-10-20 DIAGNOSIS — Z0001 Encounter for general adult medical examination with abnormal findings: Secondary | ICD-10-CM | POA: Diagnosis not present

## 2022-10-20 DIAGNOSIS — Z79899 Other long term (current) drug therapy: Secondary | ICD-10-CM | POA: Diagnosis not present

## 2022-10-20 DIAGNOSIS — M2550 Pain in unspecified joint: Secondary | ICD-10-CM | POA: Diagnosis not present

## 2022-10-20 DIAGNOSIS — Z131 Encounter for screening for diabetes mellitus: Secondary | ICD-10-CM | POA: Diagnosis not present

## 2022-10-20 DIAGNOSIS — Z8639 Personal history of other endocrine, nutritional and metabolic disease: Secondary | ICD-10-CM

## 2022-10-20 DIAGNOSIS — F411 Generalized anxiety disorder: Secondary | ICD-10-CM

## 2022-10-20 DIAGNOSIS — Z1231 Encounter for screening mammogram for malignant neoplasm of breast: Secondary | ICD-10-CM

## 2022-10-20 DIAGNOSIS — Z87898 Personal history of other specified conditions: Secondary | ICD-10-CM

## 2022-10-20 DIAGNOSIS — E785 Hyperlipidemia, unspecified: Secondary | ICD-10-CM

## 2022-10-20 DIAGNOSIS — Z720 Tobacco use: Secondary | ICD-10-CM

## 2022-10-20 DIAGNOSIS — Z1159 Encounter for screening for other viral diseases: Secondary | ICD-10-CM

## 2022-10-20 DIAGNOSIS — D582 Other hemoglobinopathies: Secondary | ICD-10-CM

## 2022-10-20 DIAGNOSIS — F428 Other obsessive-compulsive disorder: Secondary | ICD-10-CM | POA: Diagnosis not present

## 2022-10-20 DIAGNOSIS — I10 Essential (primary) hypertension: Secondary | ICD-10-CM

## 2022-10-20 DIAGNOSIS — R41840 Attention and concentration deficit: Secondary | ICD-10-CM

## 2022-10-20 DIAGNOSIS — Z114 Encounter for screening for human immunodeficiency virus [HIV]: Secondary | ICD-10-CM

## 2022-10-20 LAB — CBC WITH DIFFERENTIAL/PLATELET
Basophils Absolute: 0 10*3/uL (ref 0.0–0.1)
Basophils Relative: 0.5 % (ref 0.0–3.0)
Eosinophils Absolute: 0.2 10*3/uL (ref 0.0–0.7)
Eosinophils Relative: 2 % (ref 0.0–5.0)
HCT: 45.9 % (ref 36.0–46.0)
Hemoglobin: 15.9 g/dL — ABNORMAL HIGH (ref 12.0–15.0)
Lymphocytes Relative: 29.1 % (ref 12.0–46.0)
Lymphs Abs: 2.7 10*3/uL (ref 0.7–4.0)
MCHC: 34.6 g/dL (ref 30.0–36.0)
MCV: 91.9 fl (ref 78.0–100.0)
Monocytes Absolute: 0.8 10*3/uL (ref 0.1–1.0)
Monocytes Relative: 9 % (ref 3.0–12.0)
Neutro Abs: 5.6 10*3/uL (ref 1.4–7.7)
Neutrophils Relative %: 59.4 % (ref 43.0–77.0)
Platelets: 222 10*3/uL (ref 150.0–400.0)
RBC: 4.99 Mil/uL (ref 3.87–5.11)
RDW: 12.8 % (ref 11.5–15.5)
WBC: 9.4 10*3/uL (ref 4.0–10.5)

## 2022-10-20 LAB — COMPREHENSIVE METABOLIC PANEL WITH GFR
ALT: 11 U/L (ref 0–35)
AST: 10 U/L (ref 0–37)
Albumin: 4 g/dL (ref 3.5–5.2)
Alkaline Phosphatase: 49 U/L (ref 39–117)
BUN: 14 mg/dL (ref 6–23)
CO2: 34 meq/L — ABNORMAL HIGH (ref 19–32)
Calcium: 8.8 mg/dL (ref 8.4–10.5)
Chloride: 101 meq/L (ref 96–112)
Creatinine, Ser: 0.68 mg/dL (ref 0.40–1.20)
GFR: 100.08 mL/min (ref >60.00)
Glucose, Bld: 97 mg/dL (ref 70–99)
Potassium: 3.9 meq/L (ref 3.5–5.1)
Sodium: 141 meq/L (ref 135–145)
Total Bilirubin: 0.5 mg/dL (ref 0.2–1.2)
Total Protein: 6.5 g/dL (ref 6.0–8.3)

## 2022-10-20 LAB — LIPID PANEL
Cholesterol: 143 mg/dL (ref 0–200)
HDL: 47.4 mg/dL (ref >39.00)
LDL Cholesterol: 84 mg/dL (ref 0–99)
NonHDL: 95.99
Total CHOL/HDL Ratio: 3
Triglycerides: 61 mg/dL (ref 0.0–149.0)
VLDL: 12.2 mg/dL (ref 0.0–40.0)

## 2022-10-20 LAB — MICROALBUMIN / CREATININE URINE RATIO
Creatinine,U: 75.4 mg/dL
Microalb Creat Ratio: 12 mg/g (ref 0.0–30.0)
Microalb, Ur: 9.1 mg/dL — ABNORMAL HIGH (ref 0.0–1.9)

## 2022-10-20 LAB — B12 AND FOLATE PANEL
Folate: 22.9 ng/mL (ref >5.9)
Vitamin B-12: 502 pg/mL (ref 211–911)

## 2022-10-20 LAB — HEMOGLOBIN A1C: Hgb A1c MFr Bld: 5.6 % (ref 4.6–6.5)

## 2022-10-20 LAB — SEDIMENTATION RATE: Sed Rate: 7 mm/h (ref 0–30)

## 2022-10-20 NOTE — Assessment & Plan Note (Signed)
Smoking cessation instruction/counseling given:  counseled patient on the dangers of tobacco use, advised patient to stop smoking, and reviewed strategies to maximize success 

## 2022-10-20 NOTE — Assessment & Plan Note (Signed)
Ordered lipid panel, pending results. Work on low cholesterol diet and exercise as tolerated Continue atorvastatin 40 mg nightly

## 2022-10-20 NOTE — Assessment & Plan Note (Signed)
Suggested to pt as with anxiety, depressive symptoms and racing thoughts with inattention to see psychiatry, referral placed, to r/o or dx add, anxiety/depression/ mood disorder.

## 2022-10-20 NOTE — Assessment & Plan Note (Signed)
B12 ordered pending results °

## 2022-10-20 NOTE — Progress Notes (Signed)
Subjective:  Patient ID: Sandra Montes, female    DOB: Dec 20, 1970  Age: 52 y.o. MRN: [id number]  Patient Care Team: Mort Sawyers, FNP as PCP - General (Family Medicine)   CC:  Chief Complaint  Patient presents with   Annual Exam    Fasting     HPI Aivree Boyd is a 52 y.o. female who presents today for an annual physical exam. She reports consuming a general diet.  She does mow her lawn and does a lot of yard work  She generally feels well. She reports sleeping fairly well. She does not have additional problems to discuss today.   Vision:Not within last year Dental:No regular dental care STD:The patient denies history of sexually transmitted disease.  Lung Cancer Screening with low-dose Chest CT: already set up doing annually  - Adults age 15-80 who are current cigarette smokers or quit within the last 15 years. Must have 20 pack year history.   Mammogram: ordering today  last one was in 2022 Last pap: Jorene Guest, had about 18 months ago , [facility] Colonoscopy:11/18/21 Shingles vaccine: going to get one of two today   Pt is with acute concerns.  Pt starting to think she might have ADD. She often has racing thoughts, when lying down night she states that her mind just continues on and on and hard to fall asleep. Due to this it is hard for her to get into the 'mood to go to bed' and in the am requires extra time to get up in the am.   Wakes up in stiffness in am , right knee, right ankle and right hip, and right elbow. Stiffness typically less than 1 hour, knee and ankle sometimes more so.   Advanced Directives Patient does not have advanced directives  DEPRESSION SCREENING    10/20/2022    7:50 AM 09/07/2022   12:33 PM 06/03/2022   10:42 AM 07/04/2021    3:00 PM  PHQ 2/9 Scores  PHQ - 2 Score 1 2 0 0  PHQ- 9 Score 4 7 0 2    Lab Results  Component Value Date   CHOL 137 01/13/2022   HDL 49.40 01/13/2022   LDLCALC 69 01/13/2022   TRIG 95.0 01/13/2022    CHOLHDL 3 01/13/2022     ROS: Negative unless specifically indicated above in HPI.    Current Outpatient Medications:    albuterol (VENTOLIN HFA) 108 (90 Base) MCG/ACT inhaler, Inhale 2 puffs into the lungs every 4 (four) hours as needed for wheezing or shortness of breath., Disp: 18 g, Rfl: 0   atorvastatin (LIPITOR) 40 MG tablet, SMARTSIG:1 Tablet(s) By Mouth Every Evening, Disp: 90 tablet, Rfl: 1   B Complex Vitamins (VITAMIN B-COMPLEX) TABS, Take 1 tablet by mouth daily., Disp: , Rfl:    cetirizine (ZYRTEC) 10 MG tablet, Take by mouth., Disp: , Rfl:    Cinnamon 500 MG capsule, Take by mouth., Disp: , Rfl:    Magnesium Cl-Calcium Carbonate (SLOW MAGNESIUM/CALCIUM PO), Take by mouth., Disp: , Rfl:    metoprolol succinate (TOPROL-XL) 25 MG 24 hr tablet, Take one po qd along with 50 mg ER tablet for total of 75 mg daily, Disp: 90 tablet, Rfl: 3   metoprolol succinate (TOPROL-XL) 50 MG 24 hr tablet, TAKE 1 TABLET BY MOUTH EVERY DAY ALONG WITH 25MG  ER FOR A TOTAL OF 75MG  DAILY, Disp: 90 tablet, Rfl: 3   Omega-3 Fatty Acids (FISH OIL) 1000 MG CAPS, Take by mouth., Disp: , Rfl:  Tiotropium Bromide-Olodaterol (STIOLTO RESPIMAT) 2.5-2.5 MCG/ACT AERS, Inhale 2 puffs into the lungs daily., Disp: 1 each, Rfl: 11   Turmeric (QC TUMERIC COMPLEX) 500 MG CAPS, Take by mouth., Disp: , Rfl:    valsartan-hydrochlorothiazide (DIOVAN-HCT) 320-25 MG tablet, Take 1 tablet by mouth daily., Disp: 90 tablet, Rfl: 3   nicotine (NICODERM CQ - DOSED IN MG/24 HOURS) 21 mg/24hr patch, Place 1 patch (21 mg total) onto the skin daily. (Patient not taking: Reported on 10/20/2022), Disp: 28 patch, Rfl: 0    Objective:    BP 130/62   Pulse 67   Temp (!) 97.4 F (36.3 C) (Temporal)   Ht 5\' 4"  (1.626 m)   Wt 208 lb (94.3 kg)   SpO2 97%   BMI 35.70 kg/m   BP Readings from Last 3 Encounters:  10/20/22 130/62  10/11/22 (!) 160/96  09/07/22 124/66      Physical Exam Constitutional:      General: She is not in  acute distress.    Appearance: Normal appearance. She is obese. She is not ill-appearing.  HENT:     Head: Normocephalic.     Right Ear: Tympanic membrane normal.     Left Ear: Tympanic membrane normal.     Nose: Nose normal.     Mouth/Throat:     Mouth: Mucous membranes are moist.  Eyes:     Extraocular Movements: Extraocular movements intact.     Pupils: Pupils are equal, round, and reactive to light.  Cardiovascular:     Rate and Rhythm: Normal rate and regular rhythm.  Pulmonary:     Effort: Pulmonary effort is normal.     Breath sounds: Normal breath sounds.  Abdominal:     General: Abdomen is flat. Bowel sounds are normal.     Palpations: Abdomen is soft.     Tenderness: There is no guarding or rebound.  Musculoskeletal:        General: Normal range of motion.     Cervical back: Normal range of motion.  Skin:    General: Skin is warm.     Capillary Refill: Capillary refill takes less than 2 seconds.  Neurological:     General: No focal deficit present.     Mental Status: She is alert.  Psychiatric:        Mood and Affect: Mood normal.        Behavior: Behavior normal.        Thought Content: Thought content normal.        Judgment: Judgment normal.          Assessment & Plan:  Screening mammogram for breast cancer -     3D Screening Mammogram, Left and Right; Future  Encounter for screening for HIV -     HIV Antibody (routine testing w rflx)  Encounter for hepatitis C screening test for low risk patient -     Hepatitis C antibody  Tobacco abuse Assessment & Plan: Smoking cessation instruction/counseling given:  counseled patient on the dangers of tobacco use, advised patient to stop smoking, and reviewed strategies to maximize success    Obsessional thoughts Assessment & Plan: Suggested to pt as with anxiety, depressive symptoms and racing thoughts with inattention to see psychiatry, referral placed, to r/o or dx add, anxiety/depression/ mood  disorder.   Orders: -     Ambulatory referral to Psychiatry  Anxiety state -     Ambulatory referral to Psychiatry  Inattention -     Ambulatory referral to Psychiatry  Elevated hemoglobin (HCC) Assessment & Plan: B12 ordered pending results   Orders: -     B12 and Folate Panel -     CBC with Differential/Platelet  Dyslipidemia Assessment & Plan: Ordered lipid panel, pending results. Work on low cholesterol diet and exercise as tolerated Continue atorvastatin 40 mg nightly   Orders: -     Lipid panel  Essential hypertension Assessment & Plan: Pt advised of the following:  Continue medication as prescribed. Monitor blood pressure periodically and/or when you feel symptomatic. Goal is <130/90 on average. Ensure that you have rested for 30 minutes prior to checking your blood pressure. Record your readings and bring them to your next visit if necessary.work on a low sodium diet.   Orders: -     Microalbumin / creatinine urine ratio -     Comprehensive metabolic panel  History of prediabetes -     Hemoglobin A1c -     Comprehensive metabolic panel  Encounter for general adult medical examination with abnormal findings Assessment & Plan: Patient Counseling(The following topics were reviewed):  Preventative care handout given to pt  Health maintenance and immunizations reviewed. Please refer to Health maintenance section. Pt advised on safe sex, wearing seatbelts in car, and proper nutrition labwork ordered today for annual Dental health: Discussed importance of regular tooth brushing, flossing, and dental visits.   Orders: -     HIV Antibody (routine testing w rflx) -     Hepatitis C antibody -     B12 and Folate Panel -     CBC with Differential/Platelet -     Lipid panel -     Hemoglobin A1c -     Microalbumin / creatinine urine ratio  Polyarthralgia Assessment & Plan: Autoimmune workup labs ordered pending results.    Orders: -     Sedimentation rate -      Rheumatoid factor -     ANA  On statin therapy -     Comprehensive metabolic panel      Follow-up: Return in about 6 months (around 04/21/2023) for f/u cholesterol.   Mort Sawyers, FNP

## 2022-10-20 NOTE — Assessment & Plan Note (Signed)
Pt advised of the following:  Continue medication as prescribed. Monitor blood pressure periodically and/or when you feel symptomatic. Goal is <130/90 on average. Ensure that you have rested for 30 minutes prior to checking your blood pressure. Record your readings and bring them to your next visit if necessary.work on a low sodium diet.  

## 2022-10-20 NOTE — Assessment & Plan Note (Signed)
Patient Counseling(The following topics were reviewed): ? Preventative care handout given to pt  ?Health maintenance and immunizations reviewed. Please refer to Health maintenance section. ?Pt advised on safe sex, wearing seatbelts in car, and proper nutrition ?labwork ordered today for annual ?Dental health: Discussed importance of regular tooth brushing, flossing, and dental visits. ? ? ?

## 2022-10-20 NOTE — Patient Instructions (Signed)
  Stop by the lab prior to leaving today. I will notify you of your results once received.   Recommendations on keeping yourself healthy:  - Exercise at least 30-45 minutes a day, 3-4 days a week.  - Eat a low-fat diet with lots of fruits and vegetables, up to 7-9 servings per day.  - Seatbelts can save your life. Wear them always.  - Smoke detectors on every level of your home, check batteries every year.  - Eye Doctor - have an eye exam every 1-2 years  - Safe sex - if you may be exposed to STDs, use a condom.  - Alcohol -  If you drink, do it moderately, less than 2 drinks per day.  - Health Care Power of Attorney. Choose someone to speak for you if you are not able.  - Depression is common in our stressful world.If you're feeling down or losing interest in things you normally enjoy, please come in for a visit.  - Violence - If anyone is threatening or hurting you, please call immediately.  Due to recent changes in healthcare laws, you may see results of your imaging and/or laboratory studies on MyChart before I have had a chance to review them.  I understand that in some cases there may be results that are confusing or concerning to you. Please understand that not all results are received at the same time and often I may need to interpret multiple results in order to provide you with the best plan of care or course of treatment. Therefore, I ask that you please give me 2 business days to thoroughly review all your results before contacting my office for clarification. Should we see a critical lab result, you will be contacted sooner.   I will see you again in one year for your annual comprehensive exam unless otherwise stated and or with acute concerns.  It was a pleasure seeing you today! Please do not hesitate to reach out with any questions and or concerns.  Regards,   Telia Amundson    

## 2022-10-20 NOTE — Assessment & Plan Note (Signed)
Autoimmune workup labs ordered pending results.

## 2022-10-22 ENCOUNTER — Encounter: Payer: Self-pay | Admitting: *Deleted

## 2022-10-22 ENCOUNTER — Ambulatory Visit: Payer: 59 | Admitting: Clinical

## 2022-10-22 DIAGNOSIS — F411 Generalized anxiety disorder: Secondary | ICD-10-CM | POA: Diagnosis not present

## 2022-10-22 DIAGNOSIS — F332 Major depressive disorder, recurrent severe without psychotic features: Secondary | ICD-10-CM

## 2022-10-22 NOTE — Progress Notes (Signed)
                Rolen Conger, LCSW 

## 2022-10-22 NOTE — Progress Notes (Signed)
[facility] Counselor/Therapist Progress Note  Patient ID: Sandra Montes, MRN: [id number],    Date: 10/22/2022  Time Spent: 8:37am - 9:34am : 57 minutes   Treatment Type: Individual Therapy  Reported Symptoms: Patient reported difficulty sleeping and reported anxiety when driving to work  Mental Status Exam: Appearance:  Neat and Well Groomed     Behavior: Appropriate  Motor: Normal  Speech/Language:  Clear and Coherent  Affect: Appropriate  Mood: Patient stated, "crappy" in response to current mood  Thought process: normal  Thought content:   WNL  Sensory/Perceptual disturbances:   WNL  Orientation: oriented to person, place, and situation  Attention: Good  Concentration: Good  Memory: WNL  Fund of knowledge:  Good  Insight:   Good  Judgment:  Good  Impulse Control: Fair   Risk Assessment: Danger to Self:  No Patient denied current suicidal ideation  Self-injurious Behavior: No Danger to Others: No Patient denied current homicidal ideation Duty to Warn:no Physical Aggression / Violence:No  Access to Firearms a concern: No  Gang Involvement:No   Subjective: Patient reported she previously viewed the world in "black and white" and stated, "the grey is starting to creep in". Patient reported feeling confused as a result.  Patient reported a co-worker has expressed difficulty making decisions related to work and patient reported she is unsure how to support this individual at work. Patient reported lack of sleep this week. Patient reported she is assuming a new responsibility at work and reported she received minimal training for this role.  Patient stated, "I've got to keep my eyes open, its starting to affect me", "the lack of sleep, constant feeling of pressure and stress", and patient reported she is now looking for other employment opportunities. Patient stated, "I start everyday anxious". Patient reported traffic is a trigger for anxiety. Patient stated,  "I have an anxiety playlist",  and reported she utilizes breathing techniques in response to triggers. Patient stated, "I felt silly" when using positive affirmations. Patient stated, "I do use DBT as often as I can".  Patient reported her thoughts are a trigger for difficulty falling asleep. Patient reported thoughts of wanting to punch a co-worker but denied plan or intent to follow through with the thoughts. Patient stated, "I have bills to pay, he's not worth it". Patient reported her PCP referred patient to a psychiatrist, Dr. Maryruth Bun. Patient reported she felt "like a zombie" in the past when taking psychotropic medications.    Interventions: Cognitive Behavioral Therapy. Clinician conducted session in person at clinician's office at [facility]. Discussed a recent situation at patient's job in which patient felt the situation was not "black and white" and patient's response. Discussed current work related stressors and patient's response. Explored and identified triggers for anxiety. Provided psycho education related to deep breathing, positive affirmations, progressive muscle relaxation, mindfulness. Assisted patient in exploring barriers to utilizing coping mechanisms in the past. Explored and identified triggers for feelings of anger and patient's thoughts associated with anger. Discussed PCP's recommendation for a consultation with a psychiatrist and explored patient's ambivalence regarding psychotropic medications. Clinician requested for homework patient continue thought record and use socratic questions to challenge thoughts and to practice progressive muscle relaxation.     Collaboration of Care: Other not required at this time.      Diagnosis:  Generalized anxiety disorder   Severe episode of recurrent major depressive disorder, without psychotic features (HCC)   R/O Panic Disorder R/O Cannabis Use Disorder R/O Alcohol Use Disorder  Plan: Patient is to utilize Cognitive  Behavioral Therapy, dialectical behavioral therapy skills, thought re-framing, relaxation techniques, positive self talk and coping strategies to decrease symptoms associated with Major Depressive Disorder and Generalized Anxiety Disorder.   Frequency: weekly  Modality: individual      Long-term goal:   Learn and develop strategies to increase self care and make patient's mental and physical health a priority, such as, decrease in tobacco use, decrease in over spending behaviors   Target Date: 04/01/23  Progress: progressing    Short-term goal:  Identify triggers for feelings of anger and develop coping strategies to utilize in response to feelings of anger to prevent/reduce patient from "shutting down" in response to anger per patient's report    Target Date: 09/29/22  Progress: progressing    Develop effective communication strategies for patient to utilize when expressing her thoughts and feelings to others in a controlled and assertive way    Target Date: 09/29/22  Progress: progressing    Utilize effective communication strategies to establish healthy boundaries with patient's mother and her children to improve patient's relationship with her mother and children   Target Date: 09/29/22  Progress: progressing    Identify, challenge, and replace negative core beliefs/schema, thought patterns, and negative self talk that contribute to feelings of depression, anxiety, and low self esteem and replace with positive thoughts, beliefs, and positive self talk per patient's report   Target Date: 09/29/22  Progress: progressing    Doree Barthel, LCSW

## 2022-10-24 LAB — ANTI-NUCLEAR AB-TITER (ANA TITER)
ANA TITER: 1:80 {titer} — ABNORMAL HIGH
ANA Titer 1: 1:80 {titer} — ABNORMAL HIGH

## 2022-10-24 LAB — HEPATITIS C ANTIBODY: Hepatitis C Ab: NONREACTIVE

## 2022-10-24 LAB — HIV ANTIBODY (ROUTINE TESTING W REFLEX): HIV 1&2 Ab, 4th Generation: NONREACTIVE

## 2022-10-24 LAB — RHEUMATOID FACTOR: Rheumatoid fact SerPl-aCnc: <10 [IU]/mL (ref <14)

## 2022-10-24 LAB — ANA: Anti Nuclear Antibody (ANA): POSITIVE — AB

## 2022-10-26 ENCOUNTER — Other Ambulatory Visit: Payer: Self-pay | Admitting: Family

## 2022-10-26 DIAGNOSIS — R768 Other specified abnormal immunological findings in serum: Secondary | ICD-10-CM

## 2022-10-26 DIAGNOSIS — M2550 Pain in unspecified joint: Secondary | ICD-10-CM

## 2022-10-29 ENCOUNTER — Ambulatory Visit: Payer: 59 | Admitting: Clinical

## 2022-10-29 DIAGNOSIS — F332 Major depressive disorder, recurrent severe without psychotic features: Secondary | ICD-10-CM | POA: Diagnosis not present

## 2022-10-29 DIAGNOSIS — F411 Generalized anxiety disorder: Secondary | ICD-10-CM | POA: Diagnosis not present

## 2022-10-29 NOTE — Progress Notes (Signed)
                Geraldine Sandberg, LCSW 

## 2022-10-29 NOTE — Progress Notes (Signed)
[facility] Counselor/Therapist Progress Note  Patient ID: Carrye Goller, MRN: [id number],    Date: 10/29/2022  Time Spent: 8:35am - 9:36am : 61 minutes    Treatment Type: Individual Therapy  Reported Symptoms: Patient reported difficulty sleeping and reported only 2 hours of sleep one night this week.   Mental Status Exam: Appearance:  Neat and Well Groomed     Behavior: Appropriate  Motor: Normal  Speech/Language:  Clear and Coherent  Affect: Appropriate  Mood: normal  Thought process: normal  Thought content:   WNL  Sensory/Perceptual disturbances:   WNL  Orientation: oriented to person, place, and situation  Attention: Good  Concentration: Good  Memory: WNL  Fund of knowledge:  Good  Insight:   Good  Judgment:  Good  Impulse Control: Fair   Risk Assessment: Danger to Self:  No Patient denied current suicidal ideation  Self-injurious Behavior: No Danger to Others: No Patient denied current homicidal ideation Duty to Warn:no Physical Aggression / Violence:No  Access to Firearms a concern: No  Gang Involvement:No   Subjective: Patient reported difficulty sleeping recently and reported she only received 2 hours of sleep one night this week. Patient reported difficulty sleeping due to pain from an infection in her tooth. Patient reported she has realized she will need to remain at her current place of employment due to patient's financial and medical needs. Patient reported she recently asked her mother for financial assistance and reported this is the first time she has asked for financial assistance. 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 Patient reported upper management in her company utilizes intimidation tactics when interacting with employees and patient reported it  bothers her that there is a separate set of expectations for upper management versus other staff.  Patient reported she practiced progressive muscle relaxation the night she could not sleep and stated, "I could not relax". Patient stated, "yes, I have I've done it (progressive muscle relaxation) at work"  and reported she found the technique helpful at work. Patient reported she has been utilizing socratic questions to challenge thoughts and reported she keeps the questions in multiple places. Patient stated, "I get intrusive thoughts and I can't let go of them". Patient reported difficulty recalling information, difficulty focusing, is easily distracted, stated "I hyper focus on things I am interested in", difficulty staying organized, difficulty completing tasks, and reported impulsive behaviors, such as, spending.   Interventions: Cognitive Behavioral Therapy. Clinician conducted session in person at clinician's office at [facility]. Discussed recent decrease in patient's sleep and explored barriers to sleep. Discussed the status of work related stressors and recent changes in patient's thoughts associated with stressors. Reviewed patient's implementation of progressive muscle relaxation and challenging cognitive distortions and assessed the efficacy of each. Discussed additional symptoms patient reported during session and discussed potential evaluation for ADHD. Patient provided verbal consent for clinician to consult with a colleague to determine if testing for ADHD would be beneficial for patient. Clinician requested for homework patient continue thought record, use socratic questions to challenge thoughts, and to practice progressive muscle relaxation.      Collaboration of Care: Other not required at this time.      Diagnosis:  Generalized anxiety disorder   Severe episode of recurrent major depressive disorder, without psychotic features (HCC)   R/O Panic Disorder R/O Cannabis  Use  Disorder R/O Alcohol Use Disorder     Plan: Patient is to utilize Cognitive Behavioral Therapy, dialectical behavioral therapy skills, thought re-framing, relaxation techniques, positive self talk and coping strategies to decrease symptoms associated with Major Depressive Disorder and Generalized Anxiety Disorder.   Frequency: weekly  Modality: individual      Long-term goal:   Learn and develop strategies to increase self care and make patient's mental and physical health a priority, such as, decrease in tobacco use, decrease in over spending behaviors   Target Date: 04/01/23  Progress: progressing    Short-term goal:  Identify triggers for feelings of anger and develop coping strategies to utilize in response to feelings of anger to prevent/reduce patient from "shutting down" in response to anger per patient's report    Target Date:04/01/23  Progress: progressing    Develop effective communication strategies for patient to utilize when expressing her thoughts and feelings to others in a controlled and assertive way    Target Date: 04/01/23  Progress: progressing    Utilize effective communication strategies to establish healthy boundaries with patient's mother and her children to improve patient's relationship with her mother and children   Target Date: 04/01/23  Progress: progressing    Identify, challenge, and replace negative core beliefs/schema, thought patterns, and negative self talk that contribute to feelings of depression, anxiety, and low self esteem and replace with positive thoughts, beliefs, and positive self talk per patient's report   Target Date: 04/01/23  Progress: progressing     Doree Barthel, LCSW

## 2022-11-10 ENCOUNTER — Ambulatory Visit: Payer: 59 | Admitting: Clinical

## 2022-11-10 DIAGNOSIS — F332 Major depressive disorder, recurrent severe without psychotic features: Secondary | ICD-10-CM | POA: Diagnosis not present

## 2022-11-10 DIAGNOSIS — F411 Generalized anxiety disorder: Secondary | ICD-10-CM

## 2022-11-10 NOTE — Progress Notes (Signed)
 [facility] Counselor/Therapist Progress Note  Patient ID: Sandra Montes, MRN: [id number],    Date: 11/10/2022  Time Spent: 8:34am - 9:40am : 66 minutes   Treatment Type: Individual Therapy  Reported Symptoms: Patient reported anger/irritability  Mental Status Exam: Appearance:  Neat and Well Groomed     Behavior: Appropriate  Motor: Normal  Speech/Language:  Clear and Coherent  Affect: Tearful  Mood: angry  Thought process: normal  Thought content:   WNL  Sensory/Perceptual disturbances:   WNL  Orientation: oriented to person, place, and situation  Attention: Good  Concentration: Good  Memory: WNL  Fund of knowledge:  Good  Insight:   Good  Judgment:  Good  Impulse Control: Fair   Risk Assessment: Danger to Self:  No Patient denied current suicidal ideation  Self-injurious Behavior: No Danger to Others: No Patient denied current homicidal ideation Duty to Warn:no Physical Aggression / Violence:No  Access to Firearms a concern: No  Gang Involvement:No   Subjective: Patient reported she has not had a cigarette in 9 days and reported she has experienced increased irritability over the past 3 days. Patient reported she had a tooth pulled recently and there were complications to the procedure. Patient stated, "my finances came crashing down" and reported she had a discussion with her mother regarding patient's financial concerns. Patient reported work related stressors and reported a recent conflict with coworker. Patient stated, "I'm angry" and stated, "this is normal" in regards to patient's previous experience when patient discontinued tobacco use in the past. Patient reported she previously smoked while driving to work, while using the phone, as part of patient's morning routine, and as a coping mechanism. Patient reported she has been staying at home to avoid driving and spending money. Patient reported she tried to find a "mindless" television show to watch  as a coping strategy. Patient reported she is currently isolating herself from others due to patient's mood. Patient reported she has been utilizing headphones at work as a [employer]. Patient reported she recently spoke with a friend who is trying to decrease alcohol consumption and patient/friend shared pod casts enjoy. Patient reported she plans to park at the side of the parking garage to avoid being around others smoking. Patient stated, "I'm mad all the time right now". Patient stated, "I miss it" in regards to tobacco use. Patient reported she plans to go to [facility] for breakfast today to do something for herself.    Interventions: Cognitive Behavioral Therapy and supportive therapy.  Clinician conducted session in person at clinician's office at [facility]. Provided supportive therapy, active and reflective listening as patient discussed recent stressors and patient's thoughts and feelings in response to patient discontinuing tobacco use. Assessed patient's mood since last session and patient's current mood. Explored patient's previous experience discontinuing tobacco use, coping strategies patient has utilized in the past when discontinuing tobacco use and coping strategies patient is currently utilizing. Explored additional coping strategies. Discussed one form of self care patient feels she can accomplish today.      Collaboration of Care: Other not required at this time.      Diagnosis:  Generalized anxiety disorder   Severe episode of recurrent major depressive disorder, without psychotic features (HCC)   R/O Panic Disorder R/O Cannabis Use Disorder R/O Alcohol Use Disorder     Plan: Patient is to utilize Cognitive Behavioral Therapy, dialectical behavioral therapy skills, thought re-framing, relaxation techniques, positive self talk and coping strategies to decrease symptoms associated with Major Depressive  Disorder and Generalized Anxiety Disorder.   Frequency:  weekly  Modality: individual      Long-term goal:   Learn and develop strategies to increase self care and make patient's mental and physical health a priority, such as, decrease in tobacco use, decrease in over spending behaviors   Target Date: 04/01/23  Progress: progressing    Short-term goal:  Identify triggers for feelings of anger and develop coping strategies to utilize in response to feelings of anger to prevent/reduce patient from "shutting down" in response to anger per patient's report    Target Date:04/01/23  Progress: progressing    Develop effective communication strategies for patient to utilize when expressing her thoughts and feelings to others in a controlled and assertive way    Target Date: 04/01/23  Progress: progressing    Utilize effective communication strategies to establish healthy boundaries with patient's mother and her children to improve patient's relationship with her mother and children   Target Date: 04/01/23  Progress: progressing    Identify, challenge, and replace negative core beliefs/schema, thought patterns, and negative self talk that contribute to feelings of depression, anxiety, and low self esteem and replace with positive thoughts, beliefs, and positive self talk per patient's report   Target Date: 04/01/23  Progress: progressing    Burlene Carpen, LCSW

## 2022-11-10 NOTE — Progress Notes (Unsigned)
                Gerrald Basu, LCSW 

## 2022-11-20 ENCOUNTER — Encounter: Payer: Self-pay | Admitting: Family

## 2022-11-20 DIAGNOSIS — F17210 Nicotine dependence, cigarettes, uncomplicated: Secondary | ICD-10-CM

## 2022-11-20 DIAGNOSIS — E785 Hyperlipidemia, unspecified: Secondary | ICD-10-CM

## 2022-11-23 MED ORDER — ATORVASTATIN CALCIUM 40 MG PO TABS
ORAL_TABLET | ORAL | 3 refills | Status: DC
Start: 2022-11-23 — End: 2022-12-22

## 2022-11-23 MED ORDER — NICOTINE 14 MG/24HR TD PT24
14.0000 mg | MEDICATED_PATCH | Freq: Every day | TRANSDERMAL | 0 refills | Status: DC
Start: 2022-11-23 — End: 2022-12-15

## 2022-12-01 ENCOUNTER — Ambulatory Visit: Payer: 59 | Admitting: Clinical

## 2022-12-01 DIAGNOSIS — F411 Generalized anxiety disorder: Secondary | ICD-10-CM | POA: Diagnosis not present

## 2022-12-01 DIAGNOSIS — F332 Major depressive disorder, recurrent severe without psychotic features: Secondary | ICD-10-CM | POA: Diagnosis not present

## 2022-12-01 NOTE — Progress Notes (Signed)
[facility] Counselor/Therapist Progress Note  Patient ID: Sandra Montes, MRN: [id number],    Date: 12/01/2022  Time Spent: 8:33am - 9:33am : 60 minutes   Treatment Type: Individual Therapy  Reported Symptoms: Patient reported anger, difficulty falling asleep and staying asleep  Mental Status Exam: Appearance:  Neat and Well Groomed     Behavior: Appropriate  Motor: Normal  Speech/Language:  Clear and Coherent  Affect: Tearful  Mood: angry  Thought process: normal  Thought content:   WNL  Sensory/Perceptual disturbances:   WNL  Orientation: oriented to person, place, and situation  Attention: Good  Concentration: Good  Memory: WNL  Fund of knowledge:  Good  Insight:   Fair  Judgment:  Fair  Impulse Control: Fair   Risk Assessment: Danger to Self:  No Patient denied current suicidal ideation. Denied plan or intent.  Self-injurious Behavior: No Danger to Others: No Patient denied current homicidal ideation Duty to Warn:no Physical Aggression / Violence:No  Access to Firearms a concern: No  Gang Involvement:No   Subjective: Patient reported patient and signficant other ended their relationship recently. Patient reported feeling angry. Patient stated, "I dont want ot be here anymore" and reported she would like to runaway. Patient stated, "there is nothing that's peaceful right now". Patient reported worry about her daughter, stress at work, recent ending of the relationship, financial stressors. Patient reported feeling there is no area of peace in her life. Patient reported her signficant other is upset about patient's financial situation and reported significant other ended their relationship due to patient's anger. Patietn reproted her mother has made patient feel guilty for purchasing their home. Patient reported she recently disclosed her feelings to her signficant other and is upset that he did not check on patient after she disclosed her feelings or stop  by her home to visit. Patient stated, "he otok our friendshpi away and he didn't even consult me on it".   Drove to hre best frien's house but didn't go in because of her mood. Boyfriend still angry she moved out. Wouldn't harm herself due to her mother and her grandchildren. Discussed boundaries and provided psycho education related to healthy boundaries and various types of boundaries. He wont resolve jis feelings and she's not ok with it. Feels she's settling in the relationship. Fear of rejection. Doesn't feel there are any aspects in her life she has ocntrol over. Discussed financial resources. Feels she has no one.     Interventions: Cognitive Behavioral Therapy and supportive therapy . Clinician conducted session in person at clinician's office at [facility]. Provided supportive therapy.Discussed focusing on aspects in patient's life she has  control over, discussed boundaries, various types of boundaries, establishing healthy boundaries, discussed self care.  Last - Clinician conducted session in person at clinician's office at [facility]. Provided supportive therapy, active and reflective listening as patient discussed recent stressors and patient's thoughts and feelings in response to patient discontinuing tobacco use. Assessed patient's mood since last session and patient's current mood. Explored patient's previous experience discontinuing tobacco use, coping strategies patient has utilized in the past when discontinuing tobacco use and coping strategies patient is currently utilizing. Explored additional coping strategies. Discussed one form of self care patient feels she can accomplish today.      Collaboration of Care: Other not required at this time.      Diagnosis:  Generalized anxiety disorder   Severe episode of recurrent major depressive disorder, without psychotic features (HCC)   R/O Panic  Disorder R/O Cannabis Use Disorder R/O Alcohol Use Disorder      Plan: Patient is to utilize Cognitive Behavioral Therapy, dialectical behavioral therapy skills, thought re-framing, relaxation techniques, positive self talk and coping strategies to decrease symptoms associated with Major Depressive Disorder and Generalized Anxiety Disorder.   Frequency: weekly  Modality: individual      Long-term goal:   Learn and develop strategies to increase self care and make patient's mental and physical health a priority, such as, decrease in tobacco use, decrease in over spending behaviors   Target Date: 04/01/23  Progress: progressing    Short-term goal:  Identify triggers for feelings of anger and develop coping strategies to utilize in response to feelings of anger to prevent/reduce patient from "shutting down" in response to anger per patient's report    Target Date:04/01/23  Progress: progressing    Develop effective communication strategies for patient to utilize when expressing her thoughts and feelings to others in a controlled and assertive way    Target Date: 04/01/23  Progress: progressing    Utilize effective communication strategies to establish healthy boundaries with patient's mother and her children to improve patient's relationship with her mother and children   Target Date: 04/01/23  Progress: progressing    Identify, challenge, and replace negative core beliefs/schema, thought patterns, and negative self talk that contribute to feelings of depression, anxiety, and low self esteem and replace with positive thoughts, beliefs, and positive self talk per patient's report   Target Date: 04/01/23  Progress: progressing       Doree Barthel, LCSW

## 2022-12-01 NOTE — Progress Notes (Unsigned)
                Karen Sharpe, LCSW 

## 2022-12-02 ENCOUNTER — Ambulatory Visit: Payer: Self-pay | Admitting: Dermatology

## 2022-12-02 ENCOUNTER — Ambulatory Visit (INDEPENDENT_AMBULATORY_CARE_PROVIDER_SITE_OTHER): Payer: 59 | Admitting: Clinical

## 2022-12-02 VITALS — BP 122/71 | HR 66

## 2022-12-02 DIAGNOSIS — D229 Melanocytic nevi, unspecified: Secondary | ICD-10-CM

## 2022-12-02 DIAGNOSIS — L814 Other melanin hyperpigmentation: Secondary | ICD-10-CM

## 2022-12-02 DIAGNOSIS — D1801 Hemangioma of skin and subcutaneous tissue: Secondary | ICD-10-CM

## 2022-12-02 DIAGNOSIS — I781 Nevus, non-neoplastic: Secondary | ICD-10-CM

## 2022-12-02 DIAGNOSIS — L738 Other specified follicular disorders: Secondary | ICD-10-CM

## 2022-12-02 DIAGNOSIS — F411 Generalized anxiety disorder: Secondary | ICD-10-CM | POA: Diagnosis not present

## 2022-12-02 DIAGNOSIS — F332 Major depressive disorder, recurrent severe without psychotic features: Secondary | ICD-10-CM

## 2022-12-02 DIAGNOSIS — D224 Melanocytic nevi of scalp and neck: Secondary | ICD-10-CM

## 2022-12-02 DIAGNOSIS — L578 Other skin changes due to chronic exposure to nonionizing radiation: Secondary | ICD-10-CM

## 2022-12-02 DIAGNOSIS — L719 Rosacea, unspecified: Secondary | ICD-10-CM

## 2022-12-02 DIAGNOSIS — W908XXA Exposure to other nonionizing radiation, initial encounter: Secondary | ICD-10-CM

## 2022-12-02 MED ORDER — METRONIDAZOLE 0.75 % EX CREA: TOPICAL_CREAM | CUTANEOUS | 11 refills | Status: DC

## 2022-12-02 NOTE — Progress Notes (Signed)
   Follow-Up Visit   Subjective  Sandra Montes is a 52 y.o. female who presents for the following: Spot on the chest that has grown and changed color, present over a year. She also would like her nose checked today. No history of skin cancer.   The patient has spots, moles and lesions to be evaluated, some may be new or changing.     The following portions of the chart were reviewed this encounter and updated as appropriate: medications, allergies, medical history  Review of Systems:  No other skin or systemic complaints except as noted in HPI or Assessment and Plan.  Objective  Well appearing patient in no apparent distress; mood and affect are within normal limits.  A focused examination was performed of the following areas: Face, chest  Relevant physical exam findings are noted in the Assessment and Plan.  left neck 2.5 mm medium brown macule    Assessment & Plan   Nevus left neck  vs [place].  Benign-appearing, stable.  Observation.  Call clinic for new or changing moles.  Recommend daily use of broad spectrum spf 30+ sunscreen to sun-exposed areas.   ACTINIC DAMAGE - chronic, secondary to cumulative UV radiation exposure/sun exposure over time - diffuse scaly erythematous macules with underlying dyspigmentation - Recommend daily broad spectrum sunscreen SPF 30+ to sun-exposed areas, reapply every 2 hours as needed.  - Recommend staying in the shade or wearing long sleeves, sun glasses (UVA+UVB protection) and wide brim hats (4-inch brim around the entire circumference of the hat). - Call for new or changing lesions.  LENTIGINES Exam: scattered tan macules Due to sun exposure Treatment Plan: Benign-appearing, observe. Recommend daily broad spectrum sunscreen SPF 30+ to sun-exposed areas, reapply every 2 hours as needed.  Call for any changes  TELANGIECTASIA Exam: dilated blood vessels on chest  Treatment Plan: Benign appearing on exam Call for  changes  HEMANGIOMA Exam: red and purple papules, including left chest Discussed benign nature. Recommend observation. Call for changes.  Sebaceous Hyperplasia - Small yellow papules with a central dell of the nose - Benign-appearing - Observe. Call for changes.  ROSACEA Exam Mid face erythema with telangiectasias   Chronic and persistent condition with duration or expected duration over one year. Condition is symptomatic/ bothersome to patient. Not currently at goal.   Rosacea is a chronic progressive skin condition usually affecting the face of adults, causing redness and/or acne bumps. It is treatable but not curable. It sometimes affects the eyes (ocular rosacea) as well. It may respond to topical and/or systemic medication and can flare with stress, sun exposure, alcohol, exercise, topical steroids (including hydrocortisone/cortisone 10) and some foods.  Daily application of broad spectrum spf 30+ sunscreen to face is recommended to reduce flares.  Patient denies grittiness of the eyes  Treatment Plan Start metronidazole 0.75% cream Apply to nose and cheeks 1-2 times a day dsp 45g 1 yr Rf.   Return if symptoms worsen or fail to improve.  ICherlyn Labella, CMA, am acting as scribe for Willeen Niece, MD .   Documentation: I have reviewed the above documentation for accuracy and completeness, and I agree with the above.  Willeen Niece, MD

## 2022-12-02 NOTE — Patient Instructions (Addendum)
Rosacea  What is rosacea? Rosacea (say: ro-zay-sha) is a common skin disease that usually begins as a trend of flushing or blushing easily.  As rosacea progresses, a persistent redness in the center of the face will develop and may gradually spread beyond the nose and cheeks to the forehead and chin.  In some cases, the ears, chest, and back could be affected.  Rosacea may appear as tiny blood vessels or small red bumps that occur in crops.  Frequently they can contain pus, and are called "pustules".  If the bumps do not contain pus, they are referred to as "papules".  Rarely, in prolonged, untreated cases of rosacea, the oil glands of the nose and cheeks may become permanently enlarged.  This is called rhinophyma, and is seen more frequently in men.  Signs and Risks In its beginning stages, rosacea tends to come and go, which makes it difficult to recognize.  It can start as intermittent flushing of the face.  Eventually, blood vessels may become permanently visible.  Pustules and papules can appear, but can be mistaken for adult acne.  People of all races, ages, genders and ethnic groups are at risk of developing rosacea.  However, it is more common in women (especially around menopause) and adults with fair skin between the ages of 67 and 92.  Treatment Dermatologists typically recommend a combination of treatments to effectively manage rosacea.  Treatment can improve symptoms and may stop the progression of the rosacea.  Treatment may involve both topical and oral medications.  The tetracycline antibiotics are often used for their anti-inflammatory effect; however, because of the possibility of developing antibiotic resistance, they should not be used long term at full dose.  For dilated blood vessels the options include electrodessication (uses electric current through a small needle), laser treatment, and cosmetics to hide the redness.   With all forms of treatment, improvement is a slow process, and  patients may not see any results for the first 3-4 weeks.  It is very important to avoid the sun and other triggers.  Patients must wear sunscreen daily.  Skin Care Instructions: Cleanse the skin with a mild soap such as CeraVe cleanser, Cetaphil cleanser, or Dove soap once or twice daily as needed. Moisturize with Eucerin Redness Relief Daily Perfecting Lotion (has a subtle green tint), CeraVe Moisturizing Cream, or Oil of Olay Daily Moisturizer with sunscreen every morning and/or night as recommended. Makeup should be "non-comedogenic" (won't clog pores) and be labeled "for sensitive skin". Good choices for cosmetics are: Neutrogena, Almay, and Physician's Formula.  Any product with a green tint tends to offset a red complexion. If your eyes are dry and irritated, use artificial tears 2-3 times per day and cleanse the eyelids daily with baby shampoo.  Have your eyes examined at least every 2 years.  Be sure to tell your eye doctor that you have rosacea. Alcoholic beverages tend to cause flushing of the skin, and may make rosacea worse. Always wear sunscreen, protect your skin from extreme hot and cold temperatures, and avoid spicy foods, hot drinks, and mechanical irritation such as rubbing, scrubbing, or massaging the face.  Avoid harsh skin cleansers, cleansing masks, astringents, and exfoliation. If a particular product burns or makes your face feel tight, then it is likely to flare your rosacea. If you are having difficulty finding a sunscreen that you can tolerate, you may try switching to a chemical-free sunscreen.  These are ones whose active ingredient is zinc oxide or titanium dioxide  only.  They should also be fragrance free, non-comedogenic, and labeled for sensitive skin. Rosacea triggers may vary from person to person.  There are a variety of foods that have been reported to trigger rosacea.  Some patients find that keeping a diary of what they were doing when they flared helps them avoid  triggers.   Due to recent changes in healthcare laws, you may see results of your pathology and/or laboratory studies on MyChart before the doctors have had a chance to review them. We understand that in some cases there may be results that are confusing or concerning to you. Please understand that not all results are received at the same time and often the doctors may need to interpret multiple results in order to provide you with the best plan of care or course of treatment. Therefore, we ask that you please give [place] 2 business days to thoroughly review all your results before contacting the office for clarification. Should we see a critical lab result, you will be contacted sooner.   If You Need Anything After Your Visit  If you have any questions or concerns for your doctor, please call our main line at [phone] and press option 4 to reach your doctor's medical assistant. If no one answers, please leave a voicemail as directed and we will return your call as soon as possible. Messages left after 4 pm will be answered the following business day.   You may also send [place] a message via MyChart. We typically respond to MyChart messages within 1-2 business days.  For prescription refills, please ask your pharmacy to contact our office. Our fax number is [phone].  If you have an urgent issue when the clinic is closed that cannot wait until the next business day, you can page your doctor at the number below.    Please note that while we do our best to be available for urgent issues outside of office hours, we are not available 24/7.   If you have an urgent issue and are unable to reach [place], you may choose to seek medical care at your doctor's office, retail clinic, urgent care center, or emergency room.  If you have a medical emergency, please immediately call 911 or go to the emergency department.  Pager Numbers  - Dr. Gwen Pounds: [phone]  - Dr. Roseanne Reno: [phone]  In the event  of inclement weather, please call our main line at [phone] for an update on the status of any delays or closures.  Dermatology Medication Tips: Please keep the boxes that topical medications come in in order to help keep track of the instructions about where and how to use these. Pharmacies typically print the medication instructions only on the boxes and not directly on the medication tubes.   If your medication is too expensive, please contact our office at [phone] option 4 or send [place] a message through MyChart.   We are unable to tell what your co-pay for medications will be in advance as this is different depending on your insurance coverage. However, we may be able to find a substitute medication at lower cost or fill out paperwork to get insurance to cover a needed medication.   If a prior authorization is required to get your medication covered by your insurance company, please allow [place] 1-2 business days to complete this process.  Drug prices often vary depending on where the prescription is filled and some pharmacies may offer cheaper prices.  The website www.goodrx.com contains coupons for  medications through different pharmacies. The prices here do not account for what the cost may be with help from insurance (it may be cheaper with your insurance), but the website can give you the price if you did not use any insurance.  - You can print the associated coupon and take it with your prescription to the pharmacy.  - You may also stop by our office during regular business hours and pick up a GoodRx coupon card.  - If you need your prescription sent electronically to a different pharmacy, notify our office through [facility] or by phone at [phone] option 4.     Si Usted Necesita Algo Despus de Su Visita  Tambin puede enviarnos un mensaje a travs de Clinical cytogeneticist. Por lo general respondemos a los mensajes de MyChart en el transcurso de 1 a 2 das hbiles.  Para  renovar recetas, por favor pida a su farmacia que se ponga en contacto con nuestra oficina. Annie Sable de fax es [place] [phone].  Si tiene un asunto urgente cuando la clnica est cerrada y que no puede esperar hasta el siguiente da hbil, puede llamar/localizar a su doctor(a) al nmero que aparece a continuacin.   Por favor, tenga en cuenta que aunque hacemos todo lo posible para estar disponibles para asuntos urgentes fuera del horario de [place], no estamos disponibles las 24 horas del da, los 7 [address]  [address] de la [place].   Si tiene un problema urgente y no puede comunicarse con nosotros, puede optar por buscar atencin mdica  en el consultorio de su doctor(a), en una clnica privada, en un centro de atencin urgente o en una sala de emergencias.  Si tiene Engineer, drilling, por favor llame inmediatamente al 911 o vaya a la sala de emergencias.  Nmeros de bper  - Dr. Gwen Pounds: [phone]  - Dra. Moye: [phone]  - Dra. Roseanne Reno: [phone]  En caso de inclemencias del [place], por favor llame a Lacy Duverney principal al [phone] para una actualizacin sobre el [place] de cualquier retraso o cierre.  Consejos para la medicacin en dermatologa: Por favor, guarde las cajas en las que vienen los medicamentos de uso tpico para ayudarle a seguir las instrucciones sobre dnde y cmo usarlos. Las farmacias generalmente imprimen las instrucciones del medicamento slo en las cajas y no directamente en los tubos del [place].   Si su medicamento es muy caro, por favor, pngase en contacto con Rolm Gala llamando al [phone] y presione la opcin 4 o envenos un mensaje a travs de Clinical cytogeneticist.   No podemos decirle cul ser su copago por los medicamentos por adelantado ya que esto es diferente dependiendo de la cobertura de su seguro. Sin embargo, es posible que podamos encontrar un medicamento sustituto a Audiological scientist un formulario para que el seguro cubra el  medicamento que se considera necesario.   Si se requiere una autorizacin previa para que su compaa de seguros [place] su medicamento, por favor permtanos de 1 a 2 das hbiles para completar [address]  Los precios de los medicamentos varan con frecuencia dependiendo del Environmental consultant de dnde se surte la receta y alguna farmacias pueden ofrecer precios ms baratos.  El sitio web www.goodrx.com tiene cupones para medicamentos de Health and safety inspector. Los precios aqu no tienen en cuenta lo que podra costar con la ayuda del seguro (puede ser ms barato con su seguro), pero el sitio web puede darle el precio si no utiliz Tourist information centre manager.  - Puede imprimir el cupn correspondiente y  llevarlo con su receta a la farmacia.  - Tambin puede pasar por nuestra oficina durante el horario de atencin regular y Education officer, museum una tarjeta de cupones de GoodRx.  - Si necesita que su receta se enve electrnicamente a una farmacia diferente, informe a nuestra oficina a travs de MyChart de [place] o por telfono llamando al [phone] y presione la opcin 4.

## 2022-12-04 NOTE — Progress Notes (Signed)
[facility] Counselor/Therapist Progress Note  Patient ID: Sandra Montes, MRN: [id number],    Date: 12/02/22  Time Spent: 3:44pm - 4:22pm : 38 minutes   Treatment Type: Individual Therapy  Reported Symptoms: Patient reported difficulty sleeping, decreased appetite, and recent weight loss  Mental Status Exam: Appearance:  Could not assess      Behavior: Could not assess  Motor: Could not assess  Speech/Language:  Clear and Coherent  Affect: Could not assess  Mood: normal  Thought process: normal  Thought content:   WNL  Sensory/Perceptual disturbances:   WNL  Orientation: oriented to person and place  Attention: Good  Concentration: Good  Memory: WNL  Fund of knowledge:  Good  Insight:   Fair  Judgment:  Fair  Impulse Control: Fair   Risk Assessment: Danger to Self:  No Patient denied current suicidal ideation  Self-injurious Behavior: No Danger to Others: No Patient denied current homicidal ideation Duty to Warn:no Physical Aggression / Violence:No  Access to Firearms a concern: No  Gang Involvement:No   Subjective: Patient reported she did not sleep last night. Patient reported she reached out to her supervisor and is utilizing PTO today. Patient reported she is "vegetating" today and trying to rest. Patient stated,  "I had too much energy going to bed last night" and reported her thoughts kept patient awake. Patient reported she spoke with her significant other on the way to work yesterday and patient reported she requested significant other identify a time they could discuss their relationship. Patient reported she is making assumptions regarding their relationship. Patient reported she feels patient/significant other do not address issues from the past and do not discussion issues after the initial conflict. Patient reported feeling her behaviors and decisions are due to fear of rejection and a fear of failure. Patient stated, "I grew up with a  perfectionist father I was never good for". Patient reported feeling she is not getting out of her current relationship what she wants in a relationship. Patient stated, "I feel theres something to be saved here" in regards to patient's current relationship. Patient reported her significant other will not participate in couples counseling. Patient reported she plans to reach out to her PCP to discuss difficulty sleeping. Patient reported she has lost 11 lbs in last month and reported decreased appetite due to changes in the taste of food.    Interventions: Cognitive Behavioral Therapy and safety assessment.  Clinician conducted session via telephone from clinician's home office to follow up from session on 12/01/22 and assess for safety. Patient provided verbal consent to proceed with telehealth session and is aware of limitations of telephone or video visits. Patient participated in session from patient's home. Clinician provided supportive therapy as patient discussed difficulty sleeping and the status of patient's relationship with significant other. Explored triggers for decrease in sleep and discussed the possibility of patient journaling patient's thoughts prior to going to sleep via talk to text options. Discussed recommendation for couples counseling. Discussed patient contacting her PCP to discuss decreased sleep. Assessed for safety.   Collaboration of Care: Other not required at this time.      Diagnosis:  Generalized anxiety disorder   Severe episode of recurrent major depressive disorder, without psychotic features (HCC)   R/O Panic Disorder R/O Cannabis Use Disorder R/O Alcohol Use Disorder     Plan: Patient is to utilize Cognitive Behavioral Therapy, dialectical behavioral therapy skills, thought re-framing, relaxation techniques, positive self talk and coping strategies to decrease symptoms associated  with Major Depressive Disorder and Generalized Anxiety Disorder.   Frequency:  weekly  Modality: individual      Long-term goal:   Learn and develop strategies to increase self care and make patient's mental and physical health a priority, such as, decrease in tobacco use, decrease in over spending behaviors   Target Date: 04/01/23  Progress: progressing    Short-term goal:  Identify triggers for feelings of anger and develop coping strategies to utilize in response to feelings of anger to prevent/reduce patient from "shutting down" in response to anger per patient's report    Target Date:04/01/23  Progress: progressing    Develop effective communication strategies for patient to utilize when expressing her thoughts and feelings to others in a controlled and assertive way    Target Date: 04/01/23  Progress: progressing    Utilize effective communication strategies to establish healthy boundaries with patient's mother and her children to improve patient's relationship with her mother and children   Target Date: 04/01/23  Progress: progressing    Identify, challenge, and replace negative core beliefs/schema, thought patterns, and negative self talk that contribute to feelings of depression, anxiety, and low self esteem and replace with positive thoughts, beliefs, and positive self talk per patient's report   Target Date: 04/01/23  Progress: progressing         Doree Barthel, LCSW

## 2022-12-04 NOTE — Progress Notes (Signed)
                Anitha Kreiser, LCSW 

## 2022-12-15 ENCOUNTER — Other Ambulatory Visit: Payer: Self-pay | Admitting: Family

## 2022-12-15 DIAGNOSIS — F17210 Nicotine dependence, cigarettes, uncomplicated: Secondary | ICD-10-CM

## 2022-12-15 MED ORDER — NICOTINE 7 MG/24HR TD PT24
7.0000 mg | MEDICATED_PATCH | Freq: Every day | TRANSDERMAL | 1 refills | Status: DC
Start: 2022-12-15 — End: 2022-12-22

## 2022-12-15 NOTE — Telephone Encounter (Signed)
(  [place] [facility])  Called pt in regards to her recent my chart message with worries for SI/HI. I was able to speak with her on the phone directly.   She stated she is not 'doing too well'  She is having problems at home, relationship is over, and she is stating that she is exhausting to be around. She states she is having trouble sleeping and or staying asleep, worsening overwhelming stress when she decided to have mom move in with her, and has been dealing with years of trauma with her history from childhood. She does actively see Doree Barthel our behavioral psychologist here at [facility], and has upcoming appt Thursday 8/15.   She is having suicidal thoughts. She states that as of late her suicidal ideation has become worse over the last few months. She denies an active plan but does state she has had the thought about thirty times about jumping off a cliff just today which is more than usual and reports She is 'tired of fighting'  She states she didn't sleep at all last night. Was attempting to work some things out with her s/o without avail. She is scared of committing suicide so she states she will not do it. I did advise pt of urgent walk in facilities and provided her with a list of phone numbers to call when and If she is worried she may commit or come up with a plan for suicide, also sent list of urgent facilities to her mychart. She plans to call her s/o and have him drive her over to [employer]. She is currently at work, and she has promised that she will call him upon hanging up. She also verbalized understanding that if anytime her SI worsens call 911.

## 2022-12-15 NOTE — Progress Notes (Unsigned)
ni

## 2022-12-16 ENCOUNTER — Encounter (HOSPITAL_COMMUNITY): Payer: Self-pay | Admitting: Emergency Medicine

## 2022-12-16 ENCOUNTER — Other Ambulatory Visit: Payer: Self-pay

## 2022-12-16 ENCOUNTER — Ambulatory Visit (INDEPENDENT_AMBULATORY_CARE_PROVIDER_SITE_OTHER)
Admission: EM | Admit: 2022-12-16 | Discharge: 2022-12-17 | Disposition: A | Discharge status: Other Acute Inpatient Hospital | Payer: 59 | Source: Home / Self Care

## 2022-12-16 DIAGNOSIS — R001 Bradycardia, unspecified: Secondary | ICD-10-CM | POA: Insufficient documentation

## 2022-12-16 DIAGNOSIS — F17201 Nicotine dependence, unspecified, in remission: Secondary | ICD-10-CM | POA: Diagnosis not present

## 2022-12-16 DIAGNOSIS — F17211 Nicotine dependence, cigarettes, in remission: Secondary | ICD-10-CM | POA: Insufficient documentation

## 2022-12-16 DIAGNOSIS — I10 Essential (primary) hypertension: Secondary | ICD-10-CM | POA: Insufficient documentation

## 2022-12-16 DIAGNOSIS — R45851 Suicidal ideations: Secondary | ICD-10-CM | POA: Insufficient documentation

## 2022-12-16 DIAGNOSIS — Z1152 Encounter for screening for COVID-19: Secondary | ICD-10-CM | POA: Insufficient documentation

## 2022-12-16 DIAGNOSIS — F1290 Cannabis use, unspecified, uncomplicated: Secondary | ICD-10-CM

## 2022-12-16 DIAGNOSIS — J439 Emphysema, unspecified: Secondary | ICD-10-CM | POA: Insufficient documentation

## 2022-12-16 DIAGNOSIS — R9431 Abnormal electrocardiogram [ECG] [EKG]: Secondary | ICD-10-CM | POA: Insufficient documentation

## 2022-12-16 DIAGNOSIS — Z79899 Other long term (current) drug therapy: Secondary | ICD-10-CM | POA: Insufficient documentation

## 2022-12-16 DIAGNOSIS — F332 Major depressive disorder, recurrent severe without psychotic features: Secondary | ICD-10-CM | POA: Insufficient documentation

## 2022-12-16 DIAGNOSIS — G8929 Other chronic pain: Secondary | ICD-10-CM | POA: Insufficient documentation

## 2022-12-16 LAB — POC URINE PREG, ED: Preg Test, Ur: NEGATIVE

## 2022-12-16 LAB — COMPREHENSIVE METABOLIC PANEL WITH GFR
ALT: 31 U/L (ref 0–44)
AST: 25 U/L (ref 15–41)
Albumin: 4.3 g/dL (ref 3.5–5.0)
Alkaline Phosphatase: 50 U/L (ref 38–126)
Anion gap: 13 (ref 5–15)
BUN: 6 mg/dL (ref 6–20)
CO2: 30 mmol/L (ref 22–32)
Calcium: 9.5 mg/dL (ref 8.9–10.3)
Chloride: 97 mmol/L — ABNORMAL LOW (ref 98–111)
Creatinine, Ser: 0.68 mg/dL (ref 0.44–1.00)
GFR, Estimated: >60 mL/min (ref >60)
Glucose, Bld: 99 mg/dL (ref 70–99)
Potassium: 3 mmol/L — ABNORMAL LOW (ref 3.5–5.1)
Sodium: 140 mmol/L (ref 135–145)
Total Bilirubin: 1.4 mg/dL — ABNORMAL HIGH (ref 0.3–1.2)
Total Protein: 7.7 g/dL (ref 6.5–8.1)

## 2022-12-16 LAB — CBC WITH DIFFERENTIAL/PLATELET
Abs Immature Granulocytes: 0.03 10*3/uL (ref 0.00–0.07)
Basophils Absolute: 0.1 10*3/uL (ref 0.0–0.1)
Basophils Relative: 1 %
Eosinophils Absolute: 0.1 10*3/uL (ref 0.0–0.5)
Eosinophils Relative: 1 %
HCT: 45.3 % (ref 36.0–46.0)
Hemoglobin: 16.3 g/dL — ABNORMAL HIGH (ref 12.0–15.0)
Immature Granulocytes: 0 %
Lymphocytes Relative: 31 %
Lymphs Abs: 3.1 10*3/uL (ref 0.7–4.0)
MCH: 31.2 pg (ref 26.0–34.0)
MCHC: 36 g/dL (ref 30.0–36.0)
MCV: 86.6 fL (ref 80.0–100.0)
Monocytes Absolute: 0.7 10*3/uL (ref 0.1–1.0)
Monocytes Relative: 7 %
Neutro Abs: 5.9 10*3/uL (ref 1.7–7.7)
Neutrophils Relative %: 60 %
Platelets: 220 10*3/uL (ref 150–400)
RBC: 5.23 MIL/uL — ABNORMAL HIGH (ref 3.87–5.11)
RDW: 12.2 % (ref 11.5–15.5)
WBC: 9.9 10*3/uL (ref 4.0–10.5)
nRBC: 0 % (ref 0.0–0.2)

## 2022-12-16 LAB — POCT URINE DRUG SCREEN - MANUAL ENTRY (I-SCREEN)
POC Amphetamine UR: NOT DETECTED
POC Buprenorphine (BUP): NOT DETECTED
POC Cocaine UR: NOT DETECTED
POC Marijuana UR: POSITIVE — AB
POC Methadone UR: NOT DETECTED
POC Methamphetamine UR: NOT DETECTED
POC Morphine: NOT DETECTED
POC Oxazepam (BZO): NOT DETECTED
POC Oxycodone UR: NOT DETECTED
POC Secobarbital (BAR): NOT DETECTED

## 2022-12-16 LAB — TROPONIN I (HIGH SENSITIVITY): Troponin I (High Sensitivity): 9 ng/L (ref <18)

## 2022-12-16 LAB — SARS CORONAVIRUS 2 BY RT PCR: SARS Coronavirus 2 by RT PCR: NEGATIVE

## 2022-12-16 LAB — CK: Total CK: 61 U/L (ref 38–234)

## 2022-12-16 LAB — TSH: TSH: 2.239 u[IU]/mL (ref 0.350–4.500)

## 2022-12-16 LAB — POCT PREGNANCY, URINE: Preg Test, Ur: NEGATIVE

## 2022-12-16 MED ORDER — VALSARTAN-HYDROCHLOROTHIAZIDE 320-25 MG PO TABS: 1.0000 | ORAL_TABLET | Freq: Every day | ORAL | Status: DC

## 2022-12-16 MED ORDER — ALBUTEROL SULFATE HFA 108 (90 BASE) MCG/ACT IN AERS
2.0000 | INHALATION_SPRAY | RESPIRATORY_TRACT | Status: DC | PRN
  Filled 2022-12-16: qty 6.7

## 2022-12-16 MED ORDER — ATORVASTATIN CALCIUM 40 MG PO TABS
40.0000 mg | ORAL_TABLET | Freq: Every day | ORAL | Status: DC
  Administered 2022-12-17: 40 mg via ORAL
  Filled 2022-12-16: qty 1

## 2022-12-16 MED ORDER — MAGNESIUM HYDROXIDE 400 MG/5ML PO SUSP: 30.0000 mL | Freq: Every day | ORAL | Status: DC | PRN

## 2022-12-16 MED ORDER — DULOXETINE HCL 30 MG PO CPEP
30.0000 mg | ORAL_CAPSULE | Freq: Every day | ORAL | Status: DC
  Administered 2022-12-16 – 2022-12-17 (×2): 30 mg via ORAL
  Filled 2022-12-16 (×2): qty 1

## 2022-12-16 MED ORDER — ALUM & MAG HYDROXIDE-SIMETH 200-200-20 MG/5ML PO SUSP: 30.0000 mL | ORAL | Status: DC | PRN

## 2022-12-16 MED ORDER — TRAZODONE HCL 50 MG PO TABS
50.0000 mg | ORAL_TABLET | Freq: Every evening | ORAL | Status: DC | PRN
  Administered 2022-12-16: 50 mg via ORAL
  Filled 2022-12-16: qty 1

## 2022-12-16 MED ORDER — UMECLIDINIUM BROMIDE 62.5 MCG/ACT IN AEPB: 1.0000 | INHALATION_SPRAY | Freq: Every day | RESPIRATORY_TRACT | Status: DC

## 2022-12-16 MED ORDER — TIOTROPIUM BROMIDE-OLODATEROL 2.5-2.5 MCG/ACT IN AERS: 2.0000 | INHALATION_SPRAY | Freq: Every day | RESPIRATORY_TRACT | Status: DC

## 2022-12-16 MED ORDER — IBUPROFEN 600 MG PO TABS: 600.0000 mg | ORAL_TABLET | Freq: Four times a day (QID) | ORAL | Status: DC | PRN

## 2022-12-16 MED ORDER — POTASSIUM CHLORIDE CRYS ER 20 MEQ PO TBCR
20.0000 meq | EXTENDED_RELEASE_TABLET | Freq: Two times a day (BID) | ORAL | Status: DC
  Administered 2022-12-16 – 2022-12-17 (×2): 20 meq via ORAL
  Filled 2022-12-16 (×2): qty 1

## 2022-12-16 MED ORDER — METOPROLOL SUCCINATE ER 25 MG PO TB24
75.0000 mg | ORAL_TABLET | Freq: Every day | ORAL | Status: DC
  Administered 2022-12-17: 75 mg via ORAL
  Filled 2022-12-16: qty 3

## 2022-12-16 MED ORDER — ARFORMOTEROL TARTRATE 15 MCG/2ML IN NEBU: 15.0000 ug | INHALATION_SOLUTION | Freq: Two times a day (BID) | RESPIRATORY_TRACT | Status: DC

## 2022-12-16 MED ORDER — IRBESARTAN 150 MG PO TABS
300.0000 mg | ORAL_TABLET | Freq: Every day | ORAL | Status: DC
  Administered 2022-12-17: 300 mg via ORAL
  Filled 2022-12-16: qty 2

## 2022-12-16 MED ORDER — ACETAMINOPHEN 325 MG PO TABS: 650.0000 mg | ORAL_TABLET | Freq: Four times a day (QID) | ORAL | Status: DC | PRN

## 2022-12-16 MED ORDER — HYDROCHLOROTHIAZIDE 25 MG PO TABS
25.0000 mg | ORAL_TABLET | Freq: Every day | ORAL | Status: DC
  Administered 2022-12-17: 25 mg via ORAL
  Filled 2022-12-16: qty 1

## 2022-12-16 MED ORDER — HYDROXYZINE HCL 25 MG PO TABS
25.0000 mg | ORAL_TABLET | Freq: Three times a day (TID) | ORAL | Status: DC | PRN
  Administered 2022-12-16: 25 mg via ORAL
  Filled 2022-12-16: qty 1

## 2022-12-16 MED ORDER — NICOTINE 14 MG/24HR TD PT24
14.0000 mg | MEDICATED_PATCH | Freq: Every day | TRANSDERMAL | Status: DC
  Administered 2022-12-17: 14 mg via TRANSDERMAL
  Filled 2022-12-16: qty 1

## 2022-12-16 NOTE — Progress Notes (Signed)
   12/16/22 1129  [facility] Triage Screening (Walk-ins at [facility] only)  How Did You Hear About [place]? Self  What Is the Reason for Your Visit/Call Today? Pt presents to [facility] voluntarily unaccompanied due to worsening depression symptoms. Pt reports yesterday having a meltdown at work where she made a comment about working in a 5 story building and reporting "there is a nice parking lot I could land in". Pt reports she does not want to harm herself but continues to have intrusive and passive thoughts about suicide  "off and on" in the past 2 weeks. She reports stress associated with her relationship with her mother, financial concerns. She reports her mother had to move in with her and she has to care for her due to medical issues and it is overwhelming due to their difficult relationship. She reports crying spells, anger outbursts, anxiety, decreased sleep, decreased appetite, isolating for the past 1-3 months. She reports seeing a therapist at [facility] with Harriette Ohara and her next appointment is tomorrow at 8:30am. She reports being diagnosed with MDD but is not prescribed medication. Pt denies paranoia, drug or alcohol use,HI and AVH.  How Long Has This Been Causing You Problems? 1-6 months  Have You Recently Had Any Thoughts About Hurting Yourself? Yes  How long ago did you have thoughts about hurting yourself? yesterday  Are You Planning to Commit Suicide/Harm Yourself At This time? No (no clear plan but made a comment yesterday)  Have you Recently Had Thoughts About Hurting Someone Karolee Ohs? No  Are You Planning To Harm Someone At This Time? No  Are you currently experiencing any auditory, visual or other hallucinations? No  Have You Used Any Alcohol or Drugs in the Past 24 Hours? No  Do you have any current medical co-morbidities that require immediate attention? No  Clinician description of patient physical appearance/behavior: very tearful, depressed mood with congruen affect  What Do You Feel Would  Help You the Most Today? Treatment for Depression or other mood problem  If access to [facility] was not available, would you have sought care in the Emergency Department? No  Determination of Need Urgent (48 hours)  Options For Referral Inpatient Hospitalization

## 2022-12-16 NOTE — ED Provider Notes (Signed)
Patient reviewed with [facility] ED physician, Dr Lynelle Doctor. 12/16/2022 Labs reviewed CMP- K+ 3.0, chloride 97 CBC-  RBC 5.23, Hemoglobin 16.3 UDS- +marijuana Troponin- 9  No subsequent troponin levels required as patient remains asymptomatic.  Recommend replace K+. Medications: -potassium chloride BID, 4 doses. Patient reviewed with Dr Nelly Rout.
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
## 2022-12-16 NOTE — ED Notes (Signed)
Pt sleeping@this time. Breathing even and unlabored. Will continue to monitor for safety 

## 2022-12-16 NOTE — Progress Notes (Signed)
Per provider Karie Fetch, MD pt meets inpatient behavioral health placement. Per [facility] Danika Riley,RN pt can bed accepted within [facility] TOMORROW 12/17/2022 PENDING discharges.   Maryjean Ka, MSW, [facility] 12/16/2022 7:15 PM

## 2022-12-16 NOTE — ED Notes (Signed)
Pt A&O x 4, awake & watching TV at present, no distress noted, calm &cooperative.  Monitoring for safety.  Denies SI at present.
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
## 2022-12-16 NOTE — ED Notes (Signed)
Patient admitted into obs unit. Patient tearful and anxious at times due to current situation and endorses passive SI but able to contract for safety while on unit. Patient denies HI and A/V/H. Patient compliant with medications and oriented to unit. Patient remains cooperative at this time and in no current distress.

## 2022-12-17 ENCOUNTER — Inpatient Hospital Stay (HOSPITAL_COMMUNITY)
Admission: AD | Admit: 2022-12-17 | Discharge: 2022-12-22 | DRG: 885 | Disposition: A | Discharge status: Home / Self Care | Payer: 59 | Source: Intra-hospital | Attending: Psychiatry | Admitting: Psychiatry

## 2022-12-17 ENCOUNTER — Ambulatory Visit: Payer: 59 | Admitting: Clinical

## 2022-12-17 ENCOUNTER — Encounter (HOSPITAL_COMMUNITY): Payer: Self-pay | Admitting: Psychiatry

## 2022-12-17 ENCOUNTER — Other Ambulatory Visit: Payer: Self-pay

## 2022-12-17 DIAGNOSIS — F17210 Nicotine dependence, cigarettes, uncomplicated: Secondary | ICD-10-CM | POA: Diagnosis present

## 2022-12-17 DIAGNOSIS — R001 Bradycardia, unspecified: Secondary | ICD-10-CM | POA: Diagnosis present

## 2022-12-17 DIAGNOSIS — Z9049 Acquired absence of other specified parts of digestive tract: Secondary | ICD-10-CM

## 2022-12-17 DIAGNOSIS — R9431 Abnormal electrocardiogram [ECG] [EKG]: Secondary | ICD-10-CM | POA: Diagnosis present

## 2022-12-17 DIAGNOSIS — F4310 Post-traumatic stress disorder, unspecified: Secondary | ICD-10-CM | POA: Diagnosis present

## 2022-12-17 DIAGNOSIS — M7918 Myalgia, other site: Secondary | ICD-10-CM | POA: Diagnosis present

## 2022-12-17 DIAGNOSIS — Z8042 Family history of malignant neoplasm of prostate: Secondary | ICD-10-CM

## 2022-12-17 DIAGNOSIS — Z8249 Family history of ischemic heart disease and other diseases of the circulatory system: Secondary | ICD-10-CM

## 2022-12-17 DIAGNOSIS — R7989 Other specified abnormal findings of blood chemistry: Secondary | ICD-10-CM | POA: Diagnosis present

## 2022-12-17 DIAGNOSIS — F332 Major depressive disorder, recurrent severe without psychotic features: Principal | ICD-10-CM | POA: Diagnosis present

## 2022-12-17 DIAGNOSIS — Z1152 Encounter for screening for COVID-19: Secondary | ICD-10-CM | POA: Diagnosis not present

## 2022-12-17 DIAGNOSIS — Z825 Family history of asthma and other chronic lower respiratory diseases: Secondary | ICD-10-CM

## 2022-12-17 DIAGNOSIS — J439 Emphysema, unspecified: Secondary | ICD-10-CM | POA: Diagnosis present

## 2022-12-17 DIAGNOSIS — F410 Panic disorder [episodic paroxysmal anxiety] without agoraphobia: Secondary | ICD-10-CM | POA: Diagnosis present

## 2022-12-17 DIAGNOSIS — J45909 Unspecified asthma, uncomplicated: Secondary | ICD-10-CM | POA: Diagnosis present

## 2022-12-17 DIAGNOSIS — G8929 Other chronic pain: Secondary | ICD-10-CM | POA: Diagnosis present

## 2022-12-17 DIAGNOSIS — Z811 Family history of alcohol abuse and dependence: Secondary | ICD-10-CM | POA: Diagnosis not present

## 2022-12-17 DIAGNOSIS — I10 Essential (primary) hypertension: Secondary | ICD-10-CM | POA: Diagnosis present

## 2022-12-17 DIAGNOSIS — Z808 Family history of malignant neoplasm of other organs or systems: Secondary | ICD-10-CM

## 2022-12-17 DIAGNOSIS — R45851 Suicidal ideations: Secondary | ICD-10-CM | POA: Diagnosis present

## 2022-12-17 DIAGNOSIS — Z7951 Long term (current) use of inhaled steroids: Secondary | ICD-10-CM

## 2022-12-17 DIAGNOSIS — F1021 Alcohol dependence, in remission: Secondary | ICD-10-CM | POA: Diagnosis present

## 2022-12-17 DIAGNOSIS — Z79899 Other long term (current) drug therapy: Secondary | ICD-10-CM | POA: Diagnosis not present

## 2022-12-17 DIAGNOSIS — Z833 Family history of diabetes mellitus: Secondary | ICD-10-CM

## 2022-12-17 DIAGNOSIS — L719 Rosacea, unspecified: Secondary | ICD-10-CM | POA: Diagnosis present

## 2022-12-17 DIAGNOSIS — R457 State of emotional shock and stress, unspecified: Secondary | ICD-10-CM | POA: Diagnosis present

## 2022-12-17 DIAGNOSIS — R7303 Prediabetes: Secondary | ICD-10-CM | POA: Diagnosis present

## 2022-12-17 DIAGNOSIS — E7800 Pure hypercholesterolemia, unspecified: Secondary | ICD-10-CM | POA: Diagnosis present

## 2022-12-17 DIAGNOSIS — F411 Generalized anxiety disorder: Secondary | ICD-10-CM | POA: Diagnosis present

## 2022-12-17 MED ORDER — IRBESARTAN 300 MG PO TABS
300.0000 mg | ORAL_TABLET | Freq: Every day | ORAL | Status: DC
  Administered 2022-12-18 – 2022-12-22 (×5): 300 mg via ORAL
  Filled 2022-12-17 (×6): qty 1
  Filled 2022-12-17: qty 4
  Filled 2022-12-17 (×2): qty 1

## 2022-12-17 MED ORDER — NICOTINE 14 MG/24HR TD PT24
14.0000 mg | MEDICATED_PATCH | Freq: Every day | TRANSDERMAL | Status: DC
  Administered 2022-12-18 – 2022-12-22 (×5): 14 mg via TRANSDERMAL
  Filled 2022-12-17 (×8): qty 1

## 2022-12-17 MED ORDER — DIPHENHYDRAMINE HCL 50 MG/ML IJ SOLN: 50.0000 mg | Freq: Three times a day (TID) | INTRAMUSCULAR | Status: DC | PRN

## 2022-12-17 MED ORDER — TRAZODONE HCL 50 MG PO TABS
50.0000 mg | ORAL_TABLET | Freq: Every evening | ORAL | Status: DC | PRN
  Administered 2022-12-17 – 2022-12-21 (×4): 50 mg via ORAL
  Filled 2022-12-17 (×4): qty 1

## 2022-12-17 MED ORDER — HALOPERIDOL LACTATE 5 MG/ML IJ SOLN: 5.0000 mg | Freq: Three times a day (TID) | INTRAMUSCULAR | Status: DC | PRN

## 2022-12-17 MED ORDER — LORAZEPAM 1 MG PO TABS: 2.0000 mg | ORAL_TABLET | Freq: Three times a day (TID) | ORAL | Status: DC | PRN

## 2022-12-17 MED ORDER — LORAZEPAM 2 MG/ML IJ SOLN: 2.0000 mg | Freq: Three times a day (TID) | INTRAMUSCULAR | Status: DC | PRN

## 2022-12-17 MED ORDER — METOPROLOL SUCCINATE ER 25 MG PO TB24
75.0000 mg | ORAL_TABLET | Freq: Every day | ORAL | Status: DC
  Administered 2022-12-19: 75 mg via ORAL
  Filled 2022-12-17 (×7): qty 3

## 2022-12-17 MED ORDER — HALOPERIDOL 5 MG PO TABS: 5.0000 mg | ORAL_TABLET | Freq: Three times a day (TID) | ORAL | Status: DC | PRN

## 2022-12-17 MED ORDER — ATORVASTATIN CALCIUM 40 MG PO TABS
40.0000 mg | ORAL_TABLET | Freq: Every day | ORAL | Status: DC
  Administered 2022-12-18 – 2022-12-22 (×5): 40 mg via ORAL
  Filled 2022-12-17 (×7): qty 1

## 2022-12-17 MED ORDER — DULOXETINE HCL 30 MG PO CPEP: 30.0000 mg | ORAL_CAPSULE | Freq: Every day | ORAL | Status: DC

## 2022-12-17 MED ORDER — HYDROCHLOROTHIAZIDE 25 MG PO TABS
25.0000 mg | ORAL_TABLET | Freq: Every day | ORAL | Status: DC
  Administered 2022-12-18 – 2022-12-22 (×5): 25 mg via ORAL
  Filled 2022-12-17 (×9): qty 1

## 2022-12-17 MED ORDER — MAGNESIUM HYDROXIDE 400 MG/5ML PO SUSP: 30.0000 mL | Freq: Every day | ORAL | Status: DC | PRN

## 2022-12-17 MED ORDER — DULOXETINE HCL 30 MG PO CPEP
30.0000 mg | ORAL_CAPSULE | Freq: Every day | ORAL | Status: DC
  Administered 2022-12-18 – 2022-12-20 (×3): 30 mg via ORAL
  Filled 2022-12-17 (×5): qty 1

## 2022-12-17 MED ORDER — ACETAMINOPHEN 325 MG PO TABS
650.0000 mg | ORAL_TABLET | Freq: Four times a day (QID) | ORAL | Status: DC | PRN
  Administered 2022-12-17 – 2022-12-21 (×3): 650 mg via ORAL
  Filled 2022-12-17 (×3): qty 2

## 2022-12-17 MED ORDER — VALSARTAN-HYDROCHLOROTHIAZIDE 320-25 MG PO TABS: 1.0000 | ORAL_TABLET | Freq: Every day | ORAL | Status: DC

## 2022-12-17 MED ORDER — ALBUTEROL SULFATE HFA 108 (90 BASE) MCG/ACT IN AERS: 2.0000 | INHALATION_SPRAY | RESPIRATORY_TRACT | Status: DC | PRN

## 2022-12-17 MED ORDER — IBUPROFEN 600 MG PO TABS
600.0000 mg | ORAL_TABLET | Freq: Four times a day (QID) | ORAL | Status: DC | PRN
  Administered 2022-12-17 – 2022-12-21 (×6): 600 mg via ORAL
  Filled 2022-12-17 (×6): qty 1

## 2022-12-17 MED ORDER — HYDROXYZINE HCL 25 MG PO TABS
25.0000 mg | ORAL_TABLET | Freq: Three times a day (TID) | ORAL | Status: DC | PRN
  Administered 2022-12-17: 25 mg via ORAL
  Filled 2022-12-17 (×3): qty 1

## 2022-12-17 MED ORDER — ALUM & MAG HYDROXIDE-SIMETH 200-200-20 MG/5ML PO SUSP: 30.0000 mL | ORAL | Status: DC | PRN

## 2022-12-17 MED ORDER — TRAZODONE HCL 50 MG PO TABS: 50.0000 mg | ORAL_TABLET | Freq: Every evening | ORAL | Status: DC | PRN

## 2022-12-17 MED ORDER — DIPHENHYDRAMINE HCL 25 MG PO CAPS: 50.0000 mg | ORAL_CAPSULE | Freq: Three times a day (TID) | ORAL | Status: DC | PRN

## 2022-12-17 NOTE — ED Notes (Signed)
Patient d/c via transport to [facility] vis [facility] in stable condition. All belongings returned to patient from locker 17 intact.

## 2022-12-17 NOTE — ED Notes (Signed)
Pt was given cereal and milk.  ?

## 2022-12-17 NOTE — Plan of Care (Signed)
Nurse discussed anxiety with patient.  

## 2022-12-17 NOTE — [facility] Group Notes (Addendum)
[facility] Group Notes:  (Nursing/MHT/Case Management/Adjunct)  Date:  12/17/2022  Time:  9:03 PM  Type of Therapy:  Wrap Up group  Participation Level:  Active  Participation Quality:  Appropriate  Affect:  Appropriate  Cognitive:  Appropriate  Insight:  Improving  Engagement in Group:  Improving  Modes of Intervention:  Discussion  Summary of Progress/Problems:she rated her day to be 5 out of 10, not happy with treatment  Myca Perno E Jaretzy Lhommedieu 12/17/2022, 9:03 PM

## 2022-12-17 NOTE — ED Notes (Signed)
Annice Pih from [employer] contacted for patient transport to [facility].
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
## 2022-12-17 NOTE — Discharge Instructions (Signed)
Patient discharging to [facility]

## 2022-12-17 NOTE — Plan of Care (Signed)
Nurse discussed anxiety, depression and coping skills with patient.  

## 2022-12-17 NOTE — ED Notes (Signed)
Patient resting quietly in bed with eyes closed. Respirations equal and unlabored, skin warm and dry, NAD. Routine safety checks conducted according to facility protocol. Will continue to monitor for safety.  

## 2022-12-17 NOTE — Progress Notes (Signed)
Pt has been accepted to [facility] TODAY 12/17/2022, pending voluntary consent. Bed assignment: 304-2  Pt meets inpatient criteria per Karie Fetch, MD  Attending Physician will be Phineas Inches, MD   Report can be called to: - Adult unit: [phone]  Pt can arrive after pending items received  Care Team Notified: [facility] Rona Ravens, RN, Doran Heater, NP, Harless Litten, RN, and Karie Fetch, MD  [place], [place]  12/17/2022 10:18 AM

## 2022-12-17 NOTE — ED Notes (Signed)
Pt sleeping at present, no distress noted.  Monitoring for safety. 

## 2022-12-17 NOTE — Telephone Encounter (Signed)
Pt admitted inpatient BH per epic. Will follow.

## 2022-12-17 NOTE — ED Notes (Signed)
Pt refused breakfast but wanted black coffee.

## 2022-12-17 NOTE — Tx Team (Signed)
Initial Treatment Plan 12/17/2022 7:25 PM Sandra Montes [id number]    PATIENT STRESSORS: Financial difficulties   Health problems   Marital or family conflict   Occupational concerns   Substance abuse   Traumatic event     PATIENT STRENGTHS: Ability for insight  Average or above average intelligence  Capable of independent living  General fund of knowledge  Motivation for treatment/growth  Physical Health  Work skills    PATIENT IDENTIFIED PROBLEMS: "Suicidal thoughts"  "Anxiety"  "Depression"  "Substance use"  "Financial stressors"             DISCHARGE CRITERIA:  Ability to meet basic life and health needs Adequate post-discharge living arrangements Improved stabilization in mood, thinking, and/or behavior Medical problems require only outpatient monitoring Motivation to continue treatment in a less acute level of care Need for constant or close observation no longer present Reduction of life-threatening or endangering symptoms to within safe limits Verbal commitment to aftercare and medication compliance  PRELIMINARY DISCHARGE PLAN: Attend aftercare/continuing care group Attend PHP/IOP Outpatient therapy Return to previous living arrangement Return to previous work or school arrangements  PATIENT/FAMILY INVOLVEMENT: This treatment plan has been presented to and reviewed with the patient, Sandra Montes.  The patient and family have been given the opportunity to ask questions and make suggestions.  [name], [place] 12/17/2022, 7:25 PM

## 2022-12-17 NOTE — ED Provider Notes (Signed)
[facility] ASAP Discharge Summary  Date and Time: 12/17/2022 8:20 AM  Name: Sandra Montes  MRN:  [id number]   Discharge Diagnoses:  Final diagnoses:  Severe episode of recurrent major depressive disorder, without psychotic features (HCC)  Tobacco use disorder, moderate, in early remission  Cannabis use, uncomplicated    Subjective:  Patient seen this AM. She is easily irritable and anxious but cooperative. She reports poor sleep in the current environment. She reports decreased appetite. She denies any current active SI/HI/AVH. She reports feeling lack of control in this environment which she states is how she feels in her home environment with her mom and with all the recent psychosocial stressors. She denies any medication side effects from starting the cymbalta. She denies any new physical complaints.   Stay Summary:  Patient presented to the [facility] with increased depressive symptoms and suicidal ideations. She was started on cymbalta. She had an abnormal EKG due to potassium abnormalities. Her potassium was repleted. She was recommended for inpatient hospitalization.   Total Time spent with patient: 30 minutes  Past Psychiatric History: MDD, post-partum depression  Past Medical History: Dyslipidemia, HTN, pre-diabetes, polyarthralgia, emphysema  Family Psychiatric History: reports daughter with psychiatric issues, she "suspects bipolar"  Social History: Lives with mom and 4 dogs at home. Has 4 kids (12 - in [place], 51 - twins, daughter in [place]). Works as a Water engineer for a [employer] No legal issues. No access to firearms.  Tobacco Cessation:  A prescription for an FDA-approved tobacco cessation medication provided at discharge  Current Medications:  Current Facility-Administered Medications  Medication Dose Route Frequency Provider Last Rate Last Admin   acetaminophen (TYLENOL) tablet 650 mg  650 mg Oral Q6H PRN Karie Fetch, MD       albuterol (VENTOLIN HFA) 108  (90 Base) MCG/ACT inhaler 2 puff  2 puff Inhalation Q4H PRN Karie Fetch, MD       alum & mag hydroxide-simeth (MAALOX/MYLANTA) 200-200-20 MG/5ML suspension 30 mL  30 mL Oral Q4H PRN Karie Fetch, MD       atorvastatin (LIPITOR) tablet 40 mg  40 mg Oral Daily Karie Fetch, MD       DULoxetine (CYMBALTA) DR capsule 30 mg  30 mg Oral Daily Karie Fetch, MD   30 mg at 12/16/22 1734   irbesartan (AVAPRO) tablet 300 mg  300 mg Oral Daily Lorenza Evangelist, [facility]       And   hydrochlorothiazide (HYDRODIURIL) tablet 25 mg  25 mg Oral Daily Lorenza Evangelist, RPH       hydrOXYzine (ATARAX) tablet 25 mg  25 mg Oral TID PRN Karie Fetch, MD   25 mg at 12/16/22 2156   ibuprofen (ADVIL) tablet 600 mg  600 mg Oral Q6H PRN Karie Fetch, MD       magnesium hydroxide (MILK OF MAGNESIA) suspension 30 mL  30 mL Oral Daily PRN Karie Fetch, MD       metoprolol succinate (TOPROL-XL) 24 hr tablet 75 mg  75 mg Oral Daily Karie Fetch, MD       nicotine (NICODERM CQ - dosed in mg/24 hours) patch 14 mg  14 mg Transdermal Q0600 Karie Fetch, MD       potassium chloride SA (KLOR-CON M) CR tablet 20 mEq  20 mEq Oral BID Lenard Lance, FNP   20 mEq at 12/16/22 2156   Tiotropium Bromide-Olodaterol 2.5-2.5 MCG/ACT AERS 2 puff  2 puff Inhalation Daily Karie Fetch, MD  traZODone (DESYREL) tablet 50 mg  50 mg Oral QHS PRN Karie Fetch, MD   50 mg at 12/16/22 2156   Current Outpatient Medications  Medication Sig Dispense Refill   acetaminophen (TYLENOL) 650 MG CR tablet Take 1,300 mg by mouth every 8 (eight) hours as needed (For knee pain).     albuterol (VENTOLIN HFA) 108 (90 Base) MCG/ACT inhaler Inhale 2 puffs into the lungs every 4 (four) hours as needed for wheezing or shortness of breath. 18 g 0   atorvastatin (LIPITOR) 40 MG tablet SMARTSIG:1 Tablet(s) By Mouth Every Evening (Patient taking differently: Take 40 mg by mouth daily.) 90 tablet 3   B Complex Vitamins (VITAMIN  B-COMPLEX) TABS Take 1 tablet by mouth daily.     cetirizine (ZYRTEC) 10 MG tablet Take 10 mg by mouth daily.     ibuprofen (ADVIL) 200 MG tablet Take 600-800 mg by mouth every 6 (six) hours as needed (For knee pain).     Magnesium Cl-Calcium Carbonate (SLOW MAGNESIUM/CALCIUM PO) Take 2 tablets by mouth daily.     metoprolol succinate (TOPROL-XL) 25 MG 24 hr tablet Take one po qd along with 50 mg ER tablet for total of 75 mg daily (Patient taking differently: 25 mg daily. Take with 50 mg ER tablet for total of 75 mg daily.) 90 tablet 3   metoprolol succinate (TOPROL-XL) 50 MG 24 hr tablet TAKE 1 TABLET BY MOUTH EVERY DAY ALONG WITH 25MG  ER FOR A TOTAL OF 75MG  DAILY (Patient taking differently: 50 mg daily. Take with 25mg  ER tablet for total of 75mg  daily.) 90 tablet 3   metroNIDAZOLE (METROCREAM) 0.75 % cream Apply to nose and cheeks 1-2 times a day for Rosacea. (Patient taking differently: Apply 1 Application topically at bedtime. Apply to nose and cheeks for Rosacea.) 45 g 11   nicotine (NICODERM CQ - DOSED IN MG/24 HR) 7 mg/24hr patch Place 1 patch (7 mg total) onto the skin daily. 28 patch 1   Omega-3 Fatty Acids (FISH OIL) 1000 MG CAPS Take 2,000 mg by mouth daily.     OVER THE COUNTER MEDICATION Take 2 capsules by mouth daily. [place] Cinnamon Capsules (Cinnamon, Web designer, Turmeric and Ginseng Root)     Tiotropium Bromide-Olodaterol (STIOLTO RESPIMAT) 2.5-2.5 MCG/ACT AERS Inhale 2 puffs into the lungs daily. 1 each 11   Turmeric (QC TUMERIC COMPLEX) 500 MG CAPS Take 1,000 mg by mouth daily.     valsartan-hydrochlorothiazide (DIOVAN-HCT) 320-25 MG tablet Take 1 tablet by mouth daily. 90 tablet 3   Cinnamon 500 MG capsule Take by mouth.      PTA Medications:  Facility Ordered Medications  Medication   acetaminophen (TYLENOL) tablet 650 mg   alum & mag hydroxide-simeth (MAALOX/MYLANTA) 200-200-20 MG/5ML suspension 30 mL   magnesium hydroxide (MILK OF MAGNESIA) suspension 30 mL    hydrOXYzine (ATARAX) tablet 25 mg   traZODone (DESYREL) tablet 50 mg   nicotine (NICODERM CQ - dosed in mg/24 hours) patch 14 mg   DULoxetine (CYMBALTA) DR capsule 30 mg   albuterol (VENTOLIN HFA) 108 (90 Base) MCG/ACT inhaler 2 puff   atorvastatin (LIPITOR) tablet 40 mg   ibuprofen (ADVIL) tablet 600 mg   metoprolol succinate (TOPROL-XL) 24 hr tablet 75 mg   irbesartan (AVAPRO) tablet 300 mg   And   hydrochlorothiazide (HYDRODIURIL) tablet 25 mg   Tiotropium Bromide-Olodaterol 2.5-2.5 MCG/ACT AERS 2 puff   potassium chloride SA (KLOR-CON M) CR tablet 20 mEq   PTA Medications  Medication Sig  B Complex Vitamins (VITAMIN B-COMPLEX) TABS Take 1 tablet by mouth daily.   cetirizine (ZYRTEC) 10 MG tablet Take 10 mg by mouth daily.   Magnesium Cl-Calcium Carbonate (SLOW MAGNESIUM/CALCIUM PO) Take 2 tablets by mouth daily.   Tiotropium Bromide-Olodaterol (STIOLTO RESPIMAT) 2.5-2.5 MCG/ACT AERS Inhale 2 puffs into the lungs daily.   Omega-3 Fatty Acids (FISH OIL) 1000 MG CAPS Take 2,000 mg by mouth daily.   Turmeric (QC TUMERIC COMPLEX) 500 MG CAPS Take 1,000 mg by mouth daily.   albuterol (VENTOLIN HFA) 108 (90 Base) MCG/ACT inhaler Inhale 2 puffs into the lungs every 4 (four) hours as needed for wheezing or shortness of breath.   metoprolol succinate (TOPROL-XL) 50 MG 24 hr tablet TAKE 1 TABLET BY MOUTH EVERY DAY ALONG WITH 25MG  ER FOR A TOTAL OF 75MG  DAILY (Patient taking differently: 50 mg daily. Take with 25mg  ER tablet for total of 75mg  daily.)   valsartan-hydrochlorothiazide (DIOVAN-HCT) 320-25 MG tablet Take 1 tablet by mouth daily.   metoprolol succinate (TOPROL-XL) 25 MG 24 hr tablet Take one po qd along with 50 mg ER tablet for total of 75 mg daily (Patient taking differently: 25 mg daily. Take with 50 mg ER tablet for total of 75 mg daily.)   atorvastatin (LIPITOR) 40 MG tablet SMARTSIG:1 Tablet(s) By Mouth Every Evening (Patient taking differently: Take 40 mg by mouth daily.)    metroNIDAZOLE (METROCREAM) 0.75 % cream Apply to nose and cheeks 1-2 times a day for Rosacea. (Patient taking differently: Apply 1 Application topically at bedtime. Apply to nose and cheeks for Rosacea.)   nicotine (NICODERM CQ - DOSED IN MG/24 HR) 7 mg/24hr patch Place 1 patch (7 mg total) onto the skin daily.   Cinnamon 500 MG capsule Take by mouth.       10/20/2022    7:50 AM 09/07/2022   12:33 PM 06/03/2022   10:42 AM  Depression screen PHQ 2/9  Decreased Interest 0 1 0  Down, Depressed, Hopeless 1 1 0  PHQ - 2 Score 1 2 0  Altered sleeping 1 0 0  Tired, decreased energy 1 2 0  Change in appetite 0 2 0  Feeling bad or failure about yourself  0 0 0  Trouble concentrating 1 1 0  Moving slowly or fidgety/restless 0 0 0  Suicidal thoughts 0 0 0  PHQ-9 Score 4 7 0  Difficult doing work/chores Somewhat difficult Somewhat difficult Not difficult at all    Flowsheet Row ED from 12/16/2022 in [facility] ED from 10/11/2022 in [facility]  Admission (Discharged) from 11/18/2021 in [facility]  C-SSRS RISK CATEGORY Low Risk No Risk No Risk       Musculoskeletal  Strength & Muscle Tone: within normal limits Gait & Station: normal Patient leans: N/A  Psychiatric Specialty Exam  Presentation  General Appearance:  Appropriate for Environment; Fairly Groomed  Eye Contact: Good  Speech: Normal Rate  Speech Volume: Normal  Handedness: -- (not assessed)   Mood and Affect  Mood: "Feel out of control"  Affect: Anxious, Tearful; Labile; Depressed   Thought Process  Thought Processes: Coherent  Descriptions of Associations:Circumstantial  Orientation:Full (Time, Place and Person)  Thought Content:Perseveration  Diagnosis of Schizophrenia or Schizoaffective disorder in past: No    Hallucinations:Hallucinations: None  Ideas of Reference:None  Suicidal Thoughts:Suicidal Thoughts: Yes,  Passive SI Passive Intent and/or Plan: Without Intent; Without Plan  Homicidal Thoughts:Homicidal Thoughts: No   Sensorium  Memory: Immediate Fair; Remote Fair  Judgment: Poor  Insight: Shallow   Executive Functions  Concentration: Fair  Attention Span: Fair  Recall: [employer] of Knowledge: Fair  Language: Fair   Psychomotor Activity  Psychomotor Activity: Psychomotor Activity: Normal   Assets  Assets: Communication Skills; Desire for Improvement; Financial Resources/Insurance; Housing; Social Support; Vocational/Educational; Transportation   Sleep  Sleep: Sleep: Fair   Nutritional Assessment (For OBS and [facility] admissions only) Has the patient had a weight loss or gain of 10 pounds or more in the last 3 months?: No Has the patient had a decrease in food intake/or appetite?: No Does the patient have dental problems?: No Does the patient have eating habits or behaviors that may be indicators of an eating disorder including binging or inducing vomiting?: No Has the patient recently lost weight without trying?: 0 Has the patient been eating poorly because of a decreased appetite?: 0 Malnutrition Screening Tool Score: 0    Physical Exam  Constitutional:      Appearance: the patient is not toxic-appearing.  Pulmonary:     Effort: Pulmonary effort is normal.  Neurological:     General: No focal deficit present.     Mental Status: the patient is alert and oriented to person, place, and time.   Review of Systems  Respiratory:  Negative for shortness of breath.   Cardiovascular:  Negative for chest pain.  Gastrointestinal:  Negative for abdominal pain, constipation, diarrhea, nausea and vomiting.  Neurological:  Negative for headaches.   Blood pressure 133/85, pulse 60, temperature 98.5 F (36.9 C), resp. rate 18, SpO2 96%. There is no height or weight on file to calculate BMI.  Demographic Factors:  Caucasian  Loss Factors: Loss of significant  relationship and Financial problems/change in socioeconomic status  Historical Factors: Impulsivity  Risk Reduction Factors:   Employed, Living with another person, especially a relative, and Positive therapeutic relationship  Continued Clinical Symptoms:  Depression:   Hopelessness Alcohol/Substance Abuse/Dependencies  Cognitive Features That Contribute To Risk:  Thought constriction (tunnel vision)    Suicide Risk:  Moderate:  Frequent suicidal ideation with limited intensity, and duration, some specificity in terms of plans, no associated intent, good self-control, limited dysphoria/symptomatology, some risk factors present, and identifiable protective factors, including available and accessible social support.  Plan Of Care/Follow-up recommendations:  Activity: as tolerated  Diet: heart healthy  Other: -Follow-up with your outpatient psychiatric provider -instructions on appointment date, time, and address (location) are provided to you in discharge paperwork.  -Take your psychiatric medications as prescribed at discharge - instructions are provided to you in the discharge paperwork  -Follow-up with outpatient primary care doctor and other specialists -for management of preventative medicine and chronic medical disease: HTN, dyslipidemia, emphysema  -Testing: Follow-up with outpatient provider for abnormal lab results:  Potassium  -If you are prescribed an atypical antipsychotic medication, we recommend that your outpatient psychiatrist follow routine screening for side effects within 3 months of discharge, including monitoring: AIMS scale, height, weight, blood pressure, fasting lipid panel, HbA1c, and fasting blood sugar.   -Recommend total abstinence from alcohol, tobacco, and other illicit drug use at discharge.   -If your psychiatric symptoms recur, worsen, or if you have side effects to your psychiatric medications, call your outpatient psychiatric provider, 911, [phone]  or go to the nearest emergency department.  -If suicidal thoughts occur, immediately call your outpatient psychiatric provider, 911, [phone] or go to the nearest emergency department.   Disposition: [facility]  Karie Fetch,  MD, PGY-2 12/17/2022, 8:20 AM

## 2022-12-17 NOTE — Plan of Care (Signed)
  Problem: Education: Goal: Knowledge of [facility] Education information/materials will improve Outcome: Progressing Goal: Emotional status will improve Outcome: Progressing Goal: Mental status will improve Outcome: Progressing   Problem: Education: Goal: Ability to state activities that reduce stress will improve Outcome: Progressing

## 2022-12-17 NOTE — Group Note (Signed)
[facility] LCSW Group Therapy Note   Group Date: 12/17/2022 Start Time: 1100 End Time: 1200   Type of Therapy/Topic:  Group Therapy:  Emotion Regulation  Participation Level:  Did Not Attend   Mood:  Description of Group:    The purpose of this group is to assist patients in learning to regulate negative emotions and experience positive emotions. Patients will be guided to discuss ways in which they have been vulnerable to their negative emotions. These vulnerabilities will be juxtaposed with experiences of positive emotions or situations, and patients challenged to use positive emotions to combat negative ones. Special emphasis will be placed on coping with negative emotions in conflict situations, and patients will process healthy conflict resolution skills.  Therapeutic Goals: Patient will identify two positive emotions or experiences to reflect on in order to balance out negative emotions:  Patient will label two or more emotions that they find the most difficult to experience:  Patient will be able to demonstrate positive conflict resolution skills through discussion or role plays:   Summary of Patient Progress:       Therapeutic Modalities:   Cognitive Behavioral Therapy Feelings Identification Dialectical Behavioral Therapy   Starleen Arms, LCSW

## 2022-12-18 ENCOUNTER — Ambulatory Visit (HOSPITAL_BASED_OUTPATIENT_CLINIC_OR_DEPARTMENT_OTHER): Payer: Self-pay | Admitting: Orthopaedic Surgery

## 2022-12-18 ENCOUNTER — Encounter (HOSPITAL_COMMUNITY): Payer: Self-pay

## 2022-12-18 DIAGNOSIS — F332 Major depressive disorder, recurrent severe without psychotic features: Secondary | ICD-10-CM | POA: Diagnosis not present

## 2022-12-18 LAB — VALPROIC ACID LEVEL: Valproic Acid Lvl: <10 ug/mL — ABNORMAL LOW (ref 50.0–100.0)

## 2022-12-18 MED ORDER — DIVALPROEX SODIUM ER 250 MG PO TB24: 250.0000 mg | ORAL_TABLET | Freq: Every day | ORAL | Status: DC

## 2022-12-18 MED ORDER — POTASSIUM CHLORIDE CRYS ER 20 MEQ PO TBCR
40.0000 meq | EXTENDED_RELEASE_TABLET | Freq: Once | ORAL | Status: AC
  Administered 2022-12-18: 40 meq via ORAL
  Filled 2022-12-18 (×2): qty 2

## 2022-12-18 MED ORDER — ENSURE ENLIVE PO LIQD
237.0000 mL | Freq: Two times a day (BID) | ORAL | Status: DC
  Filled 2022-12-18 (×5): qty 237

## 2022-12-18 MED ORDER — DIVALPROEX SODIUM ER 250 MG PO TB24
250.0000 mg | ORAL_TABLET | Freq: Two times a day (BID) | ORAL | Status: DC
  Administered 2022-12-18 – 2022-12-19 (×2): 250 mg via ORAL
  Filled 2022-12-18 (×7): qty 1

## 2022-12-18 MED ORDER — POTASSIUM CHLORIDE CRYS ER 20 MEQ PO TBCR
40.0000 meq | EXTENDED_RELEASE_TABLET | Freq: Every day | ORAL | Status: DC
  Administered 2022-12-18: 40 meq via ORAL
  Filled 2022-12-18 (×5): qty 2
  Filled 2022-12-18: qty 1

## 2022-12-18 NOTE — BH IP Treatment Plan (Signed)
Interdisciplinary Treatment and Diagnostic Plan Update  12/18/2022 Time of Session: 10:30am  Sandra Montes MRN: [id number]  Principal Diagnosis: MDD (major depressive disorder), recurrent episode, severe (HCC)  Secondary Diagnoses: Principal Problem:   MDD (major depressive disorder), recurrent episode, severe (HCC)   Current Medications:  Current Facility-Administered Medications  Medication Dose Route Frequency Provider Last Rate Last Admin   acetaminophen (TYLENOL) tablet 650 mg  650 mg Oral Q6H PRN Karie Fetch, MD   650 mg at 12/18/22 0837   albuterol (VENTOLIN HFA) 108 (90 Base) MCG/ACT inhaler 2 puff  2 puff Inhalation Q4H PRN Karie Fetch, MD       alum & mag hydroxide-simeth (MAALOX/MYLANTA) 200-200-20 MG/5ML suspension 30 mL  30 mL Oral Q4H PRN Karie Fetch, MD       atorvastatin (LIPITOR) tablet 40 mg  40 mg Oral Daily Karie Fetch, MD   40 mg at 12/18/22 0834   diphenhydrAMINE (BENADRYL) capsule 50 mg  50 mg Oral TID PRN Karie Fetch, MD       Or   diphenhydrAMINE (BENADRYL) injection 50 mg  50 mg Intramuscular TID PRN Karie Fetch, MD       DULoxetine (CYMBALTA) DR capsule 30 mg  30 mg Oral Daily Karie Fetch, MD   30 mg at 12/18/22 4098   feeding supplement (ENSURE ENLIVE / ENSURE PLUS) liquid 237 mL  237 mL Oral BID BM Massengill, Harrold Donath, MD       haloperidol (HALDOL) tablet 5 mg  5 mg Oral TID PRN Karie Fetch, MD       Or   haloperidol lactate (HALDOL) injection 5 mg  5 mg Intramuscular TID PRN Karie Fetch, MD       irbesartan (AVAPRO) tablet 300 mg  300 mg Oral Daily Massengill, Harrold Donath, MD   300 mg at 12/18/22 1191   And   hydrochlorothiazide (HYDRODIURIL) tablet 25 mg  25 mg Oral Daily Massengill, Harrold Donath, MD   25 mg at 12/18/22 0834   hydrOXYzine (ATARAX) tablet 25 mg  25 mg Oral TID PRN Karie Fetch, MD   25 mg at 12/17/22 2106   ibuprofen (ADVIL) tablet 600 mg  600 mg Oral Q6H PRN Karie Fetch, MD   600 mg at  12/17/22 1446   LORazepam (ATIVAN) tablet 2 mg  2 mg Oral TID PRN Karie Fetch, MD       Or   LORazepam (ATIVAN) injection 2 mg  2 mg Intramuscular TID PRN Karie Fetch, MD       magnesium hydroxide (MILK OF MAGNESIA) suspension 30 mL  30 mL Oral Daily PRN Karie Fetch, MD       metoprolol succinate (TOPROL-XL) 24 hr tablet 75 mg  75 mg Oral Daily Karie Fetch, MD       nicotine (NICODERM CQ - dosed in mg/24 hours) patch 14 mg  14 mg Transdermal Daily Massengill, Harrold Donath, MD   14 mg at 12/18/22 0835   traZODone (DESYREL) tablet 50 mg  50 mg Oral QHS PRN Karie Fetch, MD   50 mg at 12/17/22 2106   PTA Medications: Medications Prior to Admission  Medication Sig Dispense Refill Last Dose   acetaminophen (TYLENOL) 650 MG CR tablet Take 1,300 mg by mouth every 8 (eight) hours as needed (For knee pain).      albuterol (VENTOLIN HFA) 108 (90 Base) MCG/ACT inhaler Inhale 2 puffs into the lungs every 4 (four) hours as needed for wheezing or shortness of breath. 18 g 0    atorvastatin (LIPITOR)  40 MG tablet SMARTSIG:1 Tablet(s) By Mouth Every Evening (Patient taking differently: Take 40 mg by mouth daily.) 90 tablet 3    B Complex Vitamins (VITAMIN B-COMPLEX) TABS Take 1 tablet by mouth daily.      cetirizine (ZYRTEC) 10 MG tablet Take 10 mg by mouth daily.      Cinnamon 500 MG capsule Take by mouth.      DULoxetine (CYMBALTA) 30 MG capsule Take 1 capsule (30 mg total) by mouth daily.      ibuprofen (ADVIL) 200 MG tablet Take 600-800 mg by mouth every 6 (six) hours as needed (For knee pain).      Magnesium Cl-Calcium Carbonate (SLOW MAGNESIUM/CALCIUM PO) Take 2 tablets by mouth daily.      metoprolol succinate (TOPROL-XL) 25 MG 24 hr tablet Take one po qd along with 50 mg ER tablet for total of 75 mg daily (Patient taking differently: 25 mg daily. Take with 50 mg ER tablet for total of 75 mg daily.) 90 tablet 3    metoprolol succinate (TOPROL-XL) 50 MG 24 hr tablet TAKE 1 TABLET BY  MOUTH EVERY DAY ALONG WITH 25MG  ER FOR A TOTAL OF 75MG  DAILY (Patient taking differently: 50 mg daily. Take with 25mg  ER tablet for total of 75mg  daily.) 90 tablet 3    metroNIDAZOLE (METROCREAM) 0.75 % cream Apply to nose and cheeks 1-2 times a day for Rosacea. (Patient taking differently: Apply 1 Application topically at bedtime. Apply to nose and cheeks for Rosacea.) 45 g 11    nicotine (NICODERM CQ - DOSED IN MG/24 HR) 7 mg/24hr patch Place 1 patch (7 mg total) onto the skin daily. 28 patch 1    Omega-3 Fatty Acids (FISH OIL) 1000 MG CAPS Take 2,000 mg by mouth daily.      OVER THE COUNTER MEDICATION Take 2 capsules by mouth daily. [place] Cinnamon Capsules (Cinnamon, Web designer, Turmeric and Ginseng Root)      Tiotropium Bromide-Olodaterol (STIOLTO RESPIMAT) 2.5-2.5 MCG/ACT AERS Inhale 2 puffs into the lungs daily. 1 each 11    traZODone (DESYREL) 50 MG tablet Take 1 tablet (50 mg total) by mouth at bedtime as needed for sleep.      Turmeric (QC TUMERIC COMPLEX) 500 MG CAPS Take 1,000 mg by mouth daily.      valsartan-hydrochlorothiazide (DIOVAN-HCT) 320-25 MG tablet Take 1 tablet by mouth daily. 90 tablet 3     Patient Stressors: Financial difficulties   Health problems   Marital or family conflict   Occupational concerns   Substance abuse   Traumatic event    Patient Strengths: Ability for insight  Average or above average intelligence  Capable of independent living  General fund of knowledge  Motivation for treatment/growth  Physical Health  Work skills   Treatment Modalities: Medication Management, Group therapy, Case management,  1 to 1 session with clinician, Psychoeducation, Recreational therapy.   Physician Treatment Plan for Primary Diagnosis: MDD (major depressive disorder), recurrent episode, severe (HCC) Long Term Goal(s): Improvement in symptoms so as ready for discharge   Short Term Goals: Ability to identify changes in lifestyle to reduce recurrence of  condition will improve Ability to verbalize feelings will improve Ability to disclose and discuss suicidal ideas Ability to demonstrate self-control will improve Ability to identify and develop effective coping behaviors will improve Ability to maintain clinical measurements within normal limits will improve Compliance with prescribed medications will improve Ability to identify triggers associated with substance abuse/mental health issues will improve  Medication  Management: Evaluate patient's response, side effects, and tolerance of medication regimen.  Therapeutic Interventions: 1 to 1 sessions, Unit Group sessions and Medication administration.  Evaluation of Outcomes: Progressing  Physician Treatment Plan for Secondary Diagnosis: Principal Problem:   MDD (major depressive disorder), recurrent episode, severe (HCC)  Long Term Goal(s): Improvement in symptoms so as ready for discharge   Short Term Goals: Ability to identify changes in lifestyle to reduce recurrence of condition will improve Ability to verbalize feelings will improve Ability to disclose and discuss suicidal ideas Ability to demonstrate self-control will improve Ability to identify and develop effective coping behaviors will improve Ability to maintain clinical measurements within normal limits will improve Compliance with prescribed medications will improve Ability to identify triggers associated with substance abuse/mental health issues will improve     Medication Management: Evaluate patient's response, side effects, and tolerance of medication regimen.  Therapeutic Interventions: 1 to 1 sessions, Unit Group sessions and Medication administration.  Evaluation of Outcomes: Progressing   RN Treatment Plan for Primary Diagnosis: MDD (major depressive disorder), recurrent episode, severe (HCC) Long Term Goal(s): Knowledge of disease and therapeutic regimen to maintain health will improve  Short Term Goals:  Ability to remain free from injury will improve, Ability to verbalize frustration and anger appropriately will improve, Ability to participate in decision making will improve, Ability to verbalize feelings will improve, Ability to identify and develop effective coping behaviors will improve, and Compliance with prescribed medications will improve  Medication Management: RN will administer medications as ordered by provider, will assess and evaluate patient's response and provide education to patient for prescribed medication. RN will report any adverse and/or side effects to prescribing provider.  Therapeutic Interventions: 1 on 1 counseling sessions, Psychoeducation, Medication administration, Evaluate responses to treatment, Monitor vital signs and CBGs as ordered, Perform/monitor CIWA, COWS, AIMS and Fall Risk screenings as ordered, Perform wound care treatments as ordered.  Evaluation of Outcomes: Progressing   LCSW Treatment Plan for Primary Diagnosis: MDD (major depressive disorder), recurrent episode, severe (HCC) Long Term Goal(s): Safe transition to appropriate next level of care at discharge, Engage patient in therapeutic group addressing interpersonal concerns.  Short Term Goals: Engage patient in aftercare planning with referrals and resources, Increase social support, Increase emotional regulation, Facilitate acceptance of mental health diagnosis and concerns, Identify triggers associated with mental health/substance abuse issues, and Increase skills for wellness and recovery  Therapeutic Interventions: Assess for all discharge needs, 1 to 1 time with Social worker, Explore available resources and support systems, Assess for adequacy in community support network, Educate family and significant other(s) on suicide prevention, Complete Psychosocial Assessment, Interpersonal group therapy.  Evaluation of Outcomes: Progressing   Progress in Treatment: Attending groups:  Yes. Participating in groups: Yes. Taking medication as prescribed: Yes. Toleration medication: Yes. Family/Significant other contact made: No, will contact:  once Pt gives consent Patient understands diagnosis: Yes. Discussing patient identified problems/goals with staff: Yes. Medical problems stabilized or resolved: Yes. Denies suicidal/homicidal ideation: Yes. Issues/concerns per patient self-inventory: No.  New problem(s) identified: No, Describe:  none reported  New Short Term/Long Term Goal(s):medication stabilization, elimination of SI thoughts, development of comprehensive mental wellness plan.    Patient Goals:  "coping skills and to put myself first"  Discharge Plan or Barriers: Patient recently admitted. CSW will continue to follow and assess for appropriate referrals and possible discharge planning.    Reason for Continuation of Hospitalization: Anxiety Depression Medication stabilization Suicidal ideation  Estimated Length of Stay: 5-7 days  Last  3 [place] Suicide Severity Risk Score: Flowsheet Row [facility] (Current[facility] from 12/16/2022 in [facility] ED from 10/11/2022 in [facility] RISK CATEGORY Low Risk Low Risk No Risk       Last PHQ 2/9 Scores:    10/20/2022    7:50 AM 09/07/2022   12:33 PM 06/03/2022   10:42 AM  Depression screen PHQ 2/9  Decreased Interest 0 1 0  Down, Depressed, Hopeless 1 1 0  PHQ - 2 Score 1 2 0  Altered sleeping 1 0 0  Tired, decreased energy 1 2 0  Change in appetite 0 2 0  Feeling bad or failure about yourself  0 0 0  Trouble concentrating 1 1 0  Moving slowly or fidgety/restless 0 0 0  Suicidal thoughts 0 0 0  PHQ-9 Score 4 7 0  Difficult doing work/chores Somewhat difficult Somewhat difficult Not difficult at all    Scribe for Treatment Team: Izell [place], LCSW 12/18/2022 1:35 PM

## 2022-12-18 NOTE — B[facility] Group Notes (Signed)
Adult Psychoeducational Group Note  Date:  12/18/2022 Time:  9:48 PM  Group Topic/Focus:  Wrap-Up Group:   The focus of this group is to help patients review their daily goal of treatment and discuss progress on daily workbooks.  Participation Level:  Did Not Attend  Christ Kick 12/18/2022, 9:48 PM

## 2022-12-18 NOTE — Plan of Care (Signed)
  Problem: Education: Goal: Knowledge of [place] [facility] Education information/materials will improve Outcome: Progressing Goal: Emotional status will improve Outcome: Progressing Goal: Mental status will improve Outcome: Progressing Goal: Verbalization of understanding the information provided will improve Outcome: Progressing   Problem: Activity: Goal: Interest or engagement in activities will improve Outcome: Progressing Goal: Sleeping patterns will improve Outcome: Progressing   Problem: Coping: Goal: Ability to verbalize frustrations and anger appropriately will improve Outcome: Progressing Goal: Ability to demonstrate self-control will improve Outcome: Progressing   Problem: Health Behavior/Discharge Planning: Goal: Identification of resources available to assist in meeting health care needs will improve Outcome: Progressing Goal: Compliance with treatment plan for underlying cause of condition will improve Outcome: Progressing   Problem: Physical Regulation: Goal: Ability to maintain clinical measurements within normal limits will improve Outcome: Progressing   Problem: Safety: Goal: Periods of time without injury will increase Outcome: Progressing   Problem: Education: Goal: Utilization of techniques to improve thought processes will improve Outcome: Progressing Goal: Knowledge of the prescribed therapeutic regimen will improve Outcome: Progressing   Problem: Education: Goal: Ability to state activities that reduce stress will improve Outcome: Progressing   Problem: Education: Goal: Knowledge of [place] [facility] Education information/materials will improve Outcome: Progressing   Problem: Education: Goal: Knowledge of disease or condition will improve Outcome: Progressing Goal: Understanding of discharge needs will improve Outcome: Progressing   Problem: Education: Goal: Ability to make informed decisions regarding treatment will  improve Outcome: Progressing   Problem: Education: Goal: Utilization of techniques to improve thought processes will improve Outcome: Progressing

## 2022-12-18 NOTE — Group Note (Signed)
Date:  12/18/2022 Time:  2:21 PM  Group Topic/Focus:  Goals Group:   The focus of this group is to help patients establish daily goals to achieve during treatment and discuss how the patient can incorporate goal setting into their daily lives to aide in recovery.    Participation Level:  Active  Participation Quality:  Appropriate  Affect:  Angry  Cognitive:  Appropriate  Insight: Appropriate  Engagement in Group:  Engaged  Modes of Intervention:  Discussion  Additional Comments:     Reymundo Poll 12/18/2022, 2:21 PM
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
## 2022-12-18 NOTE — Progress Notes (Signed)
Patient appears anxious. Patient denies SI/HI/AVH. Pt reports anxiety is 4/10 and depression is 7/10. Pt reports poor sleep and good appetite. Patient complied with morning medication with no reported side effects. Patient remains safe on Q82min checks and contracts for safety.      12/18/22 0949  Psych Admission Type (Psych Patients Only)  Admission Status Voluntary  Psychosocial Assessment  Patient Complaints Anxiety;Depression;Sleep disturbance  Eye Contact Fair  Facial Expression Anxious  Affect Anxious  Speech Logical/coherent  Interaction Assertive  Motor Activity Fidgety  Appearance/Hygiene Unremarkable  Behavior Characteristics Cooperative  Mood Anxious;Depressed  Thought Process  Content Blaming others  Delusions None reported or observed  Perception WDL  Hallucination None reported or observed  Judgment Poor  Confusion None  Danger to Self  Current suicidal ideation? Denies  Self-Injurious Behavior No self-injurious ideation or behavior indicators observed or expressed   Agreement Not to Harm Self Yes  Description of Agreement verbal  Danger to Others  Danger to Others None reported or observed

## 2022-12-18 NOTE — H&P (Signed)
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  BMP in a.m. 12/19/2022  Medication for other medical problems -Albuterol 108 90 days MCG/ACT inhaler 2 puffs every 4 hours as needed for -wheezing or shortness of breath -Lipitor tablet 40 mg p.o. daily for hyperlipidemia -Ibuprofen tablets 600 mg p.o. every 6 hours as needed for mild pain alternate with Tylenol for knee pain -Avapro tablet 300 mg p.o. daily for high blood pressure and -Hydrochlorothiazide tablets 25 mg p.o. daily for high blood pressure -Metoprolol succinate XL 24-hour tablet 75  mg p.o. daily for high blood pressure -Nicotine patch 14 mg transdermal over 24 hours daily for smoking cessation  Agitation protocol: Benadryl capsule 50 mg p.o. or IM 3 times daily as needed agitation   Haldol tablets 5 mg po IM 3 times daily as needed agitation   Lorazepam tablet 2 mg p.o. or IM 3 times daily as needed agitation    Other PRN Medications -Acetaminophen 650 mg every 6 as needed/mild pain -Maalox 30 mL oral every 4 as needed/digestion -Magnesium hydroxide 30 mL daily as needed/mild constipation  Labs reviewed: CMP: Potassium 3.0 replaced with 40 mEq potassium chloride x 2 doses; chloride 97, total bilirubin 1.4.  Cardiac profile: Within normal limits.  Lipid profile: Within normal limits.  CBC with differential red blood cell 5.23 high, hemoglobin 16.3 high.  Hemoglobin A1c 5.6 within normal limits.  Autoimmune: ANA positive, ANA pattern 1 nuclear homogeneous, ANA titer 1 1: 8 0 titer, Urine drug test: Positive for marijuana.    Depakote level ordered awaiting results at this time.  EKG: Sinus bradycardia, ventricular rate 58, AST and T wave abnormalities; consider inferior and anterolateral ischemia.  QT/QTc 440/431  Safety and Monitoring: Voluntary admission to inpatient psychiatric unit for safety,  stabilization and treatment Daily contact with patient to assess and evaluate symptoms and progress in treatment Patient's case to be discussed in multi-disciplinary team meeting Observation Level : q15 minute checks Vital signs: q12 hours Precautions: suicide, but pt currently verbally contracts for safety on unit    Discharge Planning: Social work and case management to assist with discharge planning and identification of hospital follow-up needs prior to discharge Estimated LOS: 5-7 days Discharge Concerns: Need to establish a safety plan; Medication compliance and effectiveness Discharge Goals: Return home with outpatient referrals for mental health follow-up  including medication management/psychotherapy.  -- The risks/benefits/side-effects/alternatives to this medication were discussed in detail with the patient and time was given for questions. The patient consents to medication trial.              -- Encouraged patient to participate  in unit milieu and in scheduled group therapies    Long Term Goal(s): Improvement in symptoms so as ready for discharge  Short Term Goals: Ability to identify changes in lifestyle to reduce recurrence of condition will improve, Ability to verbalize feelings will improve, Ability to disclose and discuss suicidal ideas, Ability to demonstrate self-control will improve, Ability to identify and develop effective coping behaviors will improve, Ability to maintain clinical measurements within normal limits will improve, Compliance with prescribed medications will improve, and Ability to identify triggers associated with substance abuse/mental health issues will improve  Physician Treatment Plan for Secondary Diagnosis: Principal Problem:   MDD (major depressive disorder), recurrent episode, severe (HCC)  I certify that inpatient services furnished can reasonably be expected to improve the patient's condition.    Cecilie Lowers, FNP 8/16/20249:34 AM

## 2022-12-19 DIAGNOSIS — F332 Major depressive disorder, recurrent severe without psychotic features: Secondary | ICD-10-CM | POA: Diagnosis not present

## 2022-12-19 MED ORDER — DIVALPROEX SODIUM ER 500 MG PO TB24
500.0000 mg | ORAL_TABLET | Freq: Every day | ORAL | Status: DC
  Administered 2022-12-19 – 2022-12-21 (×3): 500 mg via ORAL
  Filled 2022-12-19 (×5): qty 1

## 2022-12-19 MED ORDER — ENSURE ENLIVE PO LIQD
237.0000 mL | Freq: Two times a day (BID) | ORAL | Status: DC
  Filled 2022-12-19 (×10): qty 237

## 2022-12-19 MED ORDER — DIVALPROEX SODIUM ER 250 MG PO TB24
250.0000 mg | ORAL_TABLET | Freq: Every day | ORAL | Status: DC
  Filled 2022-12-19 (×3): qty 1
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
## 2022-12-19 NOTE — Progress Notes (Signed)
   12/19/22 2110  Psych Admission Type (Psych Patients Only)  Admission Status Voluntary  Psychosocial Assessment  Patient Complaints None  Eye Contact Fair  Facial Expression Anxious  Affect Anxious;Appropriate to circumstance  Speech Logical/coherent  Interaction Assertive  Motor Activity Other (Comment)  Appearance/Hygiene Unremarkable  Behavior Characteristics Cooperative;Appropriate to situation  Mood Pleasant  Thought Process  Coherency WDL  Content Blaming others  Delusions None reported or observed  Perception WDL  Hallucination None reported or observed  Judgment Poor  Confusion None  Danger to Self  Current suicidal ideation? Denies  Danger to Others  Danger to Others None reported or observed

## 2022-12-19 NOTE — Progress Notes (Signed)
   12/19/22 0610  15 Minute Checks  Location Dayroom  Visual Appearance Calm  Behavior Composed  Sleep (Behavioral Health Patients Only)  Calculate sleep? (Click Yes once per 24 hr at 0600 safety check) Yes  Documented sleep last 24 hours 5.75

## 2022-12-19 NOTE — Plan of Care (Signed)
  Problem: Education: Goal: Mental status will improve Outcome: Progressing Goal: Verbalization of understanding the information provided will improve Outcome: Progressing   Problem: Activity: Goal: Interest or engagement in activities will improve Outcome: Progressing   

## 2022-12-19 NOTE — Progress Notes (Addendum)
D. Pt has been calm and cooperative, visible in them milieu interacting well with peers and staff. Pt reported sleeping well last night, but stated she "felt like a zombie" this am. Pt endorsed having a good appetite, good concentration and normal energy level today. Per pt's self inventory, pt rated her depression,hopelessness, and anxiety a 7/0/4, respectively. Pt given prn ibuprofen for right knee pain , rated 7/10, this am.  Pt currently denies SI/HI and AVH   A. Labs and vitals monitored. Pt given and educated on medications. Pt supported emotionally and encouraged to express concerns and ask questions.   R. Pt remains safe with 15 minute checks. Will continue POC.    12/19/22 1200  Psych Admission Type (Psych Patients Only)  Admission Status Voluntary  Psychosocial Assessment  Patient Complaints Depression  Eye Contact Fair  Facial Expression Anxious  Affect Appropriate to circumstance  Speech Logical/coherent  Interaction Assertive  Motor Activity Other (Comment) (steady gait)  Appearance/Hygiene Unremarkable  Behavior Characteristics Cooperative;Appropriate to situation  Mood Pleasant;Anxious  Thought Process  Coherency WDL  Content WDL  Delusions None reported or observed  Perception WDL  Hallucination None reported or observed  Judgment Poor  Confusion None  Danger to Self  Current suicidal ideation? Denies  Self-Injurious Behavior No self-injurious ideation or behavior indicators observed or expressed   Agreement Not to Harm Self Yes  Description of Agreement verbal contract  Danger to Others  Danger to Others None reported or observed

## 2022-12-19 NOTE — Progress Notes (Signed)
   12/18/22 1955  Psych Admission Type (Psych Patients Only)  Admission Status Voluntary  Psychosocial Assessment  Patient Complaints Anxiety;Depression  Eye Contact Fair  Facial Expression Anxious  Affect Anxious;Appropriate to circumstance  Speech Logical/coherent  Interaction Assertive  Motor Activity Other (Comment)  Appearance/Hygiene Unremarkable  Behavior Characteristics Appropriate to situation;Cooperative;Anxious  Mood Anxious;Pleasant  Thought Process  Coherency WDL  Content Blaming others  Delusions None reported or observed  Perception WDL  Hallucination None reported or observed  Judgment Poor  Confusion None  Danger to Self  Current suicidal ideation? Denies  Danger to Others  Danger to Others None reported or observed

## 2022-12-19 NOTE — Group Note (Signed)
Date:  12/19/2022 Time:  10:29 PM  Group Topic/Focus:  Wrap-Up Group:   The focus of this group is to help patients review their daily goal of treatment and discuss progress on daily workbooks.    Participation Level:  Active  Participation Quality:  Appropriate and Sharing  Affect:  Appropriate  Cognitive:  Appropriate  Insight: Appropriate  Engagement in Group:  Engaged  Modes of Intervention:  Discussion and Socialization  Additional Comments:  The patient stated that her day started out at a 4 or 6 out of 10. She stated that she was tired/drowsy in the beginning of the day due to the medication that was given on the night before. The patient stated that her day did get better; 8 out of 10. She stated how she got to interact more with others. The patient stated that her goal is to talk to the nurses/doctors and SW about treatment plan and medication.   Kennieth Francois 12/19/2022, 10:29 PM

## 2022-12-20 DIAGNOSIS — F332 Major depressive disorder, recurrent severe without psychotic features: Secondary | ICD-10-CM | POA: Diagnosis not present

## 2022-12-20 LAB — BASIC METABOLIC PANEL WITH GFR
Anion gap: 10 (ref 5–15)
BUN: 8 mg/dL (ref 6–20)
CO2: 26 mmol/L (ref 22–32)
Calcium: 8.8 mg/dL — ABNORMAL LOW (ref 8.9–10.3)
Chloride: 100 mmol/L (ref 98–111)
Creatinine, Ser: 0.57 mg/dL (ref 0.44–1.00)
GFR, Estimated: >60 mL/min (ref >60)
Glucose, Bld: 97 mg/dL (ref 70–99)
Potassium: 3.6 mmol/L (ref 3.5–5.1)
Sodium: 136 mmol/L (ref 135–145)

## 2022-12-20 MED ORDER — DULOXETINE HCL 20 MG PO CPEP
40.0000 mg | ORAL_CAPSULE | Freq: Every day | ORAL | Status: DC
  Administered 2022-12-21 – 2022-12-22 (×2): 40 mg via ORAL
  Filled 2022-12-20 (×4): qty 2

## 2022-12-20 NOTE — Progress Notes (Signed)
[facility] MD Progress Note  12/20/2022 1:38 PM Sandra Montes  MRN:  [id number]  Principal Problem: MDD (major depressive disorder), recurrent episode, severe (HCC) Diagnosis: Principal Problem:   MDD (major depressive disorder), recurrent episode, severe (HCC)  Reason for admission:   This is the first psychiatric admission for this 52 year old Caucasian female with prior psychiatric history significant for major depressive order and anxiety state, who presents voluntarily to [facility] from [facility] for worsening depression leading to SI and making suicidal statement at her office at a 5 story building of " there is a nice parking lot, that I can land in," in the context of being overwhelmed with life stressors.   Yesterday the psychiatry team made the following recommendations:  -Increase Cymbalta DR capsule from 30 mg  to 40 mg p.o. daily for depression, anxiety, and chronic musculoskeletal pain -Continue hydroxyzine 25 mg p.o. 3 times daily as needed for anxiety -Continue trazodone 50 mg p.o. q. nightly as needed for insomnia -Continue Depakote ER 24-hour tablet 500 mg at bedtime for mood stabilization -Discontinue Depakote ER 24-hour tablet 250 mg in a.m. for mood stabilization due to the patient refusal  -Potassium chloride 40 mEq tablets x 2 doses only (completed) -BMP in a.m. 12/19/2022  On assessment today, the pt reports that her mood is less depressed today with happy and congruent affect.  Reports to this provider, that she had a long conversation with her boyfriend and both agreed to take some time off from the relationship to take care of their emotional issues, with plans to come back together as boyfriend/girlfriend.  Patient reports she is okay with this and plans to meet with her therapist as soon as she is discharged from the [facility].  Added that she plans to go to family therapy with her mom, to let her know that she needs to take care of herself first and  resolve all her guilt and silent suffering from the past. Patient needs shared with LCSW, who is working on finding outpatient therapy resources for patient prior to discharge.  Reports that anxiety is at a manageable level and rates as # 4 /10, with 10 being high severity Nursing staff report patient sleeping over 7.75 hours of the night.  However patient reports that she was just lying on the bed, and only slept for 2 hours last night.  Encouraged patient to ask for trazodone 50 mg p.o. q. nightly as needed for insomnia. Appetite is good Concentration is good Energy level is adequate Denies suicidal thoughts and suicidal intent or plan.  Denies having any HI.  Denies having psychotic symptoms.   Tolerating Depakote and Cymbalta well.  Denies having side effects to current psychiatric medications.  We discussed changes to current medication regimen, including discontinuing a.m. Depakote ER 250 mg due to patient refusal.  Continuing bedtime Depakote ER dosing of 500 mg po at q HS only. Denies shakiness or any other side effects from Depakote.  Plan is to obtain VPA level on 12/23/22.  Further, increase Cymbalta DR capsule from 30 mg to 40 mg p.o. daily for depression, anxiety, and chronic musculoskeletal pain.  Patient is in agreement to these medication adjustments.  Discussed the following psychosocial stressors: Attending therapeutic milieu and unit group activities as this has proven to improve patient's mood. Discussed cessation of marijuana smoking as this has adverse effect on overall psychiatric and medical wellbeing.  Patient nods in agreement.  Total Time spent with patient: 45 minutes  Past  Psychiatric History: Previous Psych Diagnoses: MDD, anxiety state, tobacco abuse Prior inpatient treatment: Denies Current/prior outpatient treatment: Yes as a teenager, for being belligerent. Prior rehab hx: Denies Psychotherapy hx: Yes  History of suicide: Denies History of homicide or  aggression: History of aggression as a teenager Psychiatric medication history: Patient has been on trial Paxil, Wellbutrin, Zoloft, Prozac, Pristiq. Psychiatric medication compliance history: Compliant Neuromodulation history: Denies Current Psychiatrist:Denies Current therapist: Yes at [place] with Harriette Ohara  Past Medical History:  Past Medical History:  Diagnosis Date   HLD (hyperlipidemia)    Hypertension     Past Surgical History:  Procedure Laterality Date   CESAREAN SECTION     1993   CHOLECYSTECTOMY     1996   COLONOSCOPY WITH PROPOFOL N/A 11/18/2021   Procedure: COLONOSCOPY WITH PROPOFOL;  Surgeon: Toney Reil, MD;  Location: [facility] ENDOSCOPY;  Service: Gastroenterology;  Laterality: N/A;   TUBAL LIGATION     1995   Family History:  Family History  Problem Relation Age of Onset   Alcohol abuse Mother    Heart attack Mother 83   Skin cancer Father    Alcohol abuse Father    Prostate cancer Father    Prostate cancer Paternal Grandfather    Skin cancer Paternal Grandfather    Asthma Daughter    Mental illness Daughter        borderline   Family Psychiatric  History: See H&P  Social History:  Social History   Substance and Sexual Activity  Alcohol Use Not Currently   Comment: h/o alcoholism quit 2017     Social History   Substance and Sexual Activity  Drug Use Yes   Types: Marijuana   Comment: edibles last time 11/16/2021    Social History   Socioeconomic History   Marital status: Divorced    Spouse name: Not on file   Number of children: 3   Years of education: 12   Highest education level: 12th grade  Occupational History    Employer: [employer]    Comment: mortgage insurance  Tobacco Use   Smoking status: Every Day    Average packs/day: 1.7 packs/day for 57.0 years (95.5 ttl pk-yrs)    Types: Cigarettes    Start date: 2004   Smokeless tobacco: Never   Tobacco comments:    Pt smokes two packs cigarettes for the past  20 yrs, wants 14 mg patch  Vaping Use   Vaping status: Never Used  Substance and Sexual Activity   Alcohol use: Not Currently    Comment: h/o alcoholism quit 2017   Drug use: Yes    Types: Marijuana    Comment: edibles last time 11/16/2021   Sexual activity: Not Currently    Partners: Male    Birth control/protection: None  Other Topics Concern   Not on file  Social History Narrative   Active smoker- lives in [place]; with mother- care giver; with quality/control- mortgage.    Social Determinants of Health   Financial Resource Strain: Not on file  Food Insecurity: No Food Insecurity (12/17/2022)   Hunger Vital Sign    Worried About Running Out of Food in the Last Year: Never true    Ran Out of Food in the Last Year: Never true  Transportation Needs: No Transportation Needs (12/17/2022)   PRAPARE - Administrator, Civil Service (Medical): No    Lack of Transportation (Non-Medical): No  Physical Activity: Not on file  Stress: Not on file  Social Connections: Not on file   Additional Social History:     Sleep: Good  Appetite:  Good  Current Medications: Current Facility-Administered Medications  Medication Dose Route Frequency Provider Last Rate Last Admin   acetaminophen (TYLENOL) tablet 650 mg  650 mg Oral Q6H PRN Karie Fetch, MD   650 mg at 12/18/22 0837   albuterol (VENTOLIN HFA) 108 (90 Base) MCG/ACT inhaler 2 puff  2 puff Inhalation Q4H PRN Karie Fetch, MD       alum & mag hydroxide-simeth (MAALOX/MYLANTA) 200-200-20 MG/5ML suspension 30 mL  30 mL Oral Q4H PRN Karie Fetch, MD       atorvastatin (LIPITOR) tablet 40 mg  40 mg Oral Daily Karie Fetch, MD   40 mg at 12/20/22 0272   diphenhydrAMINE (BENADRYL) capsule 50 mg  50 mg Oral TID PRN Karie Fetch, MD       Or   diphenhydrAMINE (BENADRYL) injection 50 mg  50 mg Intramuscular TID PRN Karie Fetch, MD       divalproex (DEPAKOTE ER) 24 hr tablet 250 mg  250 mg Oral Daily Dystany Duffy,  Jesusita Oka, FNP       divalproex (DEPAKOTE ER) 24 hr tablet 500 mg  500 mg Oral QHS Artha Chiasson C, FNP   500 mg at 12/19/22 2110   DULoxetine (CYMBALTA) DR capsule 30 mg  30 mg Oral Daily Karie Fetch, MD   30 mg at 12/20/22 0809   feeding supplement (ENSURE ENLIVE / ENSURE PLUS) liquid 237 mL  237 mL Oral BID BM Rex Kras, MD       haloperidol (HALDOL) tablet 5 mg  5 mg Oral TID PRN Karie Fetch, MD       Or   haloperidol lactate (HALDOL) injection 5 mg  5 mg Intramuscular TID PRN Karie Fetch, MD       irbesartan Evlyn Kanner) tablet 300 mg  300 mg Oral Daily Massengill, Harrold Donath, MD   300 mg at 12/20/22 5366   And   hydrochlorothiazide (HYDRODIURIL) tablet 25 mg  25 mg Oral Daily Massengill, Harrold Donath, MD   25 mg at 12/20/22 4403   hydrOXYzine (ATARAX) tablet 25 mg  25 mg Oral TID PRN Karie Fetch, MD   25 mg at 12/17/22 2106   ibuprofen (ADVIL) tablet 600 mg  600 mg Oral Q6H PRN Karie Fetch, MD   600 mg at 12/19/22 2112   LORazepam (ATIVAN) tablet 2 mg  2 mg Oral TID PRN Karie Fetch, MD       Or   LORazepam (ATIVAN) injection 2 mg  2 mg Intramuscular TID PRN Karie Fetch, MD       magnesium hydroxide (MILK OF MAGNESIA) suspension 30 mL  30 mL Oral Daily PRN Karie Fetch, MD       metoprolol succinate (TOPROL-XL) 24 hr tablet 75 mg  75 mg Oral Daily Karie Fetch, MD   75 mg at 12/19/22 4742   nicotine (NICODERM CQ - dosed in mg/24 hours) patch 14 mg  14 mg Transdermal Daily Massengill, Harrold Donath, MD   14 mg at 12/20/22 0802   potassium chloride SA (KLOR-CON M) CR tablet 40 mEq  40 mEq Oral QHS Alan Mulder C, FNP   40 mEq at 12/18/22 2147   traZODone (DESYREL) tablet 50 mg  50 mg Oral QHS PRN Karie Fetch, MD   50 mg at 12/18/22 2148    Lab Results:  Results for orders placed or performed during the hospital encounter of 12/17/22 (from the past 48  hour(s))  Valproic acid level     Status: Abnormal   Collection Time: 12/18/22  6:46 PM  Result Value Ref Range    Valproic Acid Lvl <10 (L) 50.0 - 100.0 ug/mL    Comment: RESULT CONFIRMED BY MANUAL DILUTION Performed at [facility], [address], [place], [place] [address]   Basic metabolic panel     Status: Abnormal   Collection Time: 12/20/22  6:33 AM  Result Value Ref Range   Sodium 136 135 - 145 mmol/L   Potassium 3.6 3.5 - 5.1 mmol/L   Chloride 100 98 - 111 mmol/L   CO2 26 22 - 32 mmol/L   Glucose, Bld 97 70 - 99 mg/dL    Comment: Glucose reference range applies only to samples taken after fasting for at least 8 hours.   BUN 8 6 - 20 mg/dL   Creatinine, Ser 7.82 0.44 - 1.00 mg/dL   Calcium 8.8 (L) 8.9 - 10.3 mg/dL   GFR, Estimated >95 >62 mL/min    Comment: (NOTE) Calculated using the CKD-EPI Creatinine Equation (2021)    Anion gap 10 5 - 15    Comment: Performed at [facility], [address] [place], [place] [address]   Blood Alcohol level:  No results found for: "ETH"  Metabolic Disorder Labs: Lab Results  Component Value Date   HGBA1C 5.6 10/20/2022   MPG 111 07/04/2021   Lab Results  Component Value Date   PROLACTIN 4.3 01/13/2022   Lab Results  Component Value Date   CHOL 143 10/20/2022   TRIG 61.0 10/20/2022   HDL 47.40 10/20/2022   CHOLHDL 3 10/20/2022   VLDL 12.2 10/20/2022   LDLCALC 84 10/20/2022   LDLCALC 69 01/13/2022   Physical Findings: AIMS:  , ,  ,  ,    CIWA:    COWS:     Musculoskeletal: Strength & Muscle Tone: within normal limits Gait & Station: normal Patient leans: N/A  Psychiatric Specialty Exam:  Presentation  General Appearance:  Appropriate for Environment; Casual; Fairly Groomed  Eye Contact: Good  Speech: Clear and Coherent; Normal Rate  Speech Volume: Normal  Handedness: Right  Mood and Affect  Mood: Anxious; Depressed; Labile  Affect: Labile; Congruent  Thought Process  Thought Processes: Coherent; Goal Directed  Descriptions of  Associations:Intact  Orientation:Full (Time, Place and Person)  Thought Content:Logical  History of Schizophrenia/Schizoaffective disorder:No  Duration of Psychotic Symptoms:No data recorded Hallucinations:Hallucinations: None  Ideas of Reference:None  Suicidal Thoughts:Suicidal Thoughts: No SI Passive Intent and/or Plan: -- (n/a)  Homicidal Thoughts:Homicidal Thoughts: No  Sensorium  Memory: Immediate Good; Recent Good  Judgment: Fair  Insight: Present  Executive Functions  Concentration: Good  Attention Span: Good  Recall: Good  Fund of Knowledge: Fair  Language: Good  Psychomotor Activity  Psychomotor Activity: Psychomotor Activity: Normal (Wears right knee bracelet for arthritis and fluid in knee)  Assets  Assets: Communication Skills; Desire for Improvement; Housing; Health and safety inspector; Physical Health; Resilience; Social Support  Sleep  Sleep: Sleep: Good Number of Hours of Sleep: 7.75  Physical Exam: Physical Exam Vitals and nursing note reviewed.  Constitutional:      Appearance: She is obese.  HENT:     Head: Normocephalic.     Mouth/Throat:     Mouth: Mucous membranes are moist.  Eyes:     Extraocular Movements: Extraocular movements intact.     Pupils: Pupils are equal, round, and reactive to light.  Cardiovascular:  Rate and Rhythm: Bradycardia present.  Pulmonary:     Effort: Pulmonary effort is normal.  Abdominal:     Comments: Deferred  Genitourinary:    Comments: Deferred Musculoskeletal:        General: Normal range of motion.     Cervical back: Normal range of motion.  Skin:    General: Skin is warm.  Neurological:     General: No focal deficit present.     Mental Status: She is alert and oriented to person, place, and time.  Psychiatric:        Mood and Affect: Mood normal.        Behavior: Behavior normal.    Review of Systems  Constitutional:  Negative for chills and fever.  HENT:  Negative  for sore throat.   Eyes:  Negative for blurred vision.  Respiratory:  Negative for cough, shortness of breath and wheezing.   Cardiovascular:  Negative for chest pain and palpitations.  Gastrointestinal:  Negative for abdominal pain, heartburn, nausea and vomiting.  Genitourinary: Negative.   Musculoskeletal: Negative.   Skin:  Negative for itching and rash.  Neurological:  Negative for dizziness, tingling, seizures and headaches.  Endo/Heme/Allergies:        See allergy listing  Psychiatric/Behavioral:  Positive for depression. The patient is nervous/anxious.    Blood pressure 107/80, pulse (!) 58, temperature 98.1 F (36.7 C), temperature source Oral, resp. rate 12, height 5\' 7"  (1.702 m), weight 91.2 kg, last menstrual period 10/04/2022, SpO2 96%. Body mass index is 31.48 kg/m.  Treatment Plan Summary: Daily contact with patient to assess and evaluate symptoms and progress in treatment and Medication management  Physician Treatment Plan for Primary Diagnosis:  Assessment: MDD (major depressive disorder), recurrent episode, severe (HCC)   Plan: Medication: -Increase Cymbalta DR capsule from 30 mg  to 40 mg p.o. daily for depression, anxiety, and chronic musculoskeletal pain. -Continue hydroxyzine 25 mg p.o. 3 times daily as needed for anxiety -Continue trazodone 50 mg p.o. q. nightly as needed for insomnia -Continue Depakote ER 24-hour tablet 500 mg at bedtime for mood stabilization -Discontinue Depakote ER 24-hour tablet 250 mg in a.m. for mood stabilization due to the patient refusal  -Potassium chloride 40 mEq tablets x 2 doses only (completed) -BMP in a.m. 12/19/2022   Medication for other medical problems -Albuterol 108 90 days MCG/ACT inhaler 2 puffs every 4 hours as needed for -wheezing or shortness of breath -Lipitor tablet 40 mg p.o. daily for hyperlipidemia -Ibuprofen tablets 600 mg p.o. every 6 hours as needed for mild pain alternate with Tylenol for knee  pain -Avapro tablet 300 mg p.o. daily for high blood pressure and -Hydrochlorothiazide tablets 25 mg p.o. daily for high blood pressure -Metoprolol succinate XL 24-hour tablet 75 mg p.o. daily for high blood pressure -Nicotine patch 14 mg transdermal over 24 hours daily for smoking cessation   Agitation protocol: Benadryl capsule 50 mg p.o. or IM 3 times daily as needed agitation   Haldol tablets 5 mg po IM 3 times daily as needed agitation   Lorazepam tablet 2 mg p.o. or IM 3 times daily as needed agitation     Other PRN Medications -Acetaminophen 650 mg every 6 as needed/mild pain -Maalox 30 mL oral every 4 as needed/digestion -Magnesium hydroxide 30 mL daily as needed/mild constipation   Labs reviewed: CMP: Potassium 3.0 replaced with 40 mEq potassium chloride x 2 doses; chloride 97, total bilirubin 1.4.  Cardiac profile: Within normal limits.  Lipid profile: Within  normal limits.  CBC with differential red blood cell 5.23 high, hemoglobin 16.3 high.  Hemoglobin A1c 5.6 within normal limits.  Autoimmune: ANA positive, ANA pattern 1 nuclear homogeneous, ANA titer 1 1: 8 0 titer, Urine drug test: Positive for marijuana.   BMP: Result:   12/19/22 Depakote level ordered results at this time: < 10   EKG: Sinus bradycardia, ventricular rate 58, AST and T wave abnormalities; consider inferior and anterolateral ischemia.  QT/QTc 440/431   Safety and Monitoring: Voluntary admission to inpatient psychiatric unit for safety, stabilization and treatment Daily contact with patient to assess and evaluate symptoms and progress in treatment Patient's case to be discussed in multi-disciplinary team meeting Observation Level : q15 minute checks Vital signs: q12 hours Precautions: suicide, but pt currently verbally contracts for safety on unit    Discharge Planning: Social work and case management to assist with discharge planning and identification of hospital follow-up needs prior to  discharge Estimated LOS: 5-7 days Discharge Concerns: Need to establish a safety plan; Medication compliance and effectiveness Discharge Goals: Return home with outpatient referrals for mental health follow-up including medication management/psychotherapy.   -- The risks/benefits/side-effects/alternatives to this medication were discussed in detail with the patient and time was given for questions. The patient consents to medication trial.              -- Encouraged patient to participate  in unit milieu and in scheduled group therapies    Long Term Goal(s): Improvement in symptoms so as ready for discharge   Short Term Goals: Ability to identify changes in lifestyle to reduce recurrence of condition will improve, Ability to verbalize feelings will improve, Ability to disclose and discuss suicidal ideas, Ability to demonstrate self-control will improve, Ability to identify and develop effective coping behaviors will improve, Ability to maintain clinical measurements within normal limits will improve, Compliance with prescribed medications will improve, and Ability to identify triggers associated with substance abuse/mental health issues will improve   Physician Treatment Plan for Secondary Diagnosis: Principal Problem:   MDD (major depressive disorder), recurrent episode, severe (HCC)   I certify that inpatient services furnished can reasonably be expected to improve the patient's condition.      Cecilie Lowers, FNP 12/20/2022, 1:38 PM Patient ID: Sandra Montes, female   DOB: Jul 22, 1970, 52 y.o.   MRN: [id number]

## 2022-12-20 NOTE — Progress Notes (Signed)
Patient noted be irritable this AM. Pt observed to be tearful after conversation on the phone, pt declined to discuss same. Pt refused her  AM dose of Depakote 250 mg, Provider notified. Lopressor held this AM for decreased heart rate, MD made aware. Pt is  group compliant, observed interacting well with some peers. Pt denied SI/HI and AVH. Safety maintained.   12/20/22 0825  Psych Admission Type (Psych Patients Only)  Admission Status Voluntary  Psychosocial Assessment  Patient Complaints Insomnia  Eye Contact Fair  Facial Expression Anxious  Affect Anxious;Irritable  Speech Logical/coherent  Interaction Assertive  Motor Activity Other (Comment) (WNL)  Appearance/Hygiene Unremarkable  Behavior Characteristics Anxious;Irritable (Pt refused Depakote 250 mg)  Mood Anxious;Irritable  Thought Process  Coherency WDL  Content Blaming others  Delusions None reported or observed  Perception WDL  Hallucination None reported or observed  Judgment Poor  Confusion None  Danger to Self  Current suicidal ideation? Denies  Agreement Not to Harm Self Yes  Description of Agreement Verbal  Danger to Others  Danger to Others None reported or observed

## 2022-12-20 NOTE — Progress Notes (Signed)
   12/20/22 2120  Psych Admission Type (Psych Patients Only)  Admission Status Voluntary  Psychosocial Assessment  Patient Complaints Anxiety;Depression  Eye Contact Fair  Facial Expression Anxious  Affect Anxious;Appropriate to circumstance  Speech Logical/coherent  Interaction Assertive  Motor Activity Other (Comment)  Appearance/Hygiene Unremarkable  Behavior Characteristics Cooperative;Appropriate to situation  Mood Anxious;Pleasant  Thought Process  Coherency WDL  Content Blaming others  Delusions None reported or observed  Perception WDL  Hallucination None reported or observed  Judgment Poor  Confusion None  Danger to Self  Current suicidal ideation? Denies  Agreement Not to Harm Self Yes  Description of Agreement Verbal  Danger to Others  Danger to Others None reported or observed

## 2022-12-20 NOTE — Plan of Care (Signed)
  Problem: Education: Goal: Mental status will improve Outcome: Progressing   Problem: Activity: Goal: Interest or engagement in activities will improve Outcome: Progressing   Problem: Safety: Goal: Periods of time without injury will increase Outcome: Progressing   Problem: Education: Goal: Utilization of techniques to improve thought processes will improve Outcome: Progressing

## 2022-12-20 NOTE — BHH Group Notes (Signed)
The focus of this group is to help patients establish daily goals to achieve during treatment and discuss how the patient can incorporate goal setting into their daily lives to aide in recovery.  Pt attended Orientation Goals Group. Pt was given information about schedule, house rules and an opportunity to ask questions. Pt was given education about setting SMART goals.

## 2022-12-20 NOTE — Progress Notes (Signed)
   12/20/22 0600  15 Minute Checks  Location Bedroom  Visual Appearance Calm  Behavior Composed  Sleep (Behavioral Health Patients Only)  Calculate sleep? (Click Yes once per 24 hr at 0600 safety check) Yes  Documented sleep last 24 hours 7.5

## 2022-12-20 NOTE — BHH Group Notes (Signed)
LCSW Group Therapy Note  12/20/2022   10:00-11:00am   Type of Therapy and Topic:  Group Therapy: Anger Cues and Responses  Participation Level:  Active   Description of Group:   In this group, patients learned how to recognize the physical, cognitive, emotional, and behavioral responses they have to anger-provoking situations.  They identified a recent time they became angry and how they reacted.  They analyzed how their reaction was possibly beneficial and how it was possibly unhelpful.  The group discussed a variety of healthier coping skills that could help with such a situation in the future.  They also learned that anger is a second emotion fueled by other feelings and explored their own emotions that may frequently fuel their anger.  Focus was placed on how helpful it is to recognize the underlying emotions to our anger, because working on those can lead to a more permanent solution as well as our ability to focus on the important rather than the urgent.  Therapeutic Goals: Patients will remember their last incident of anger and how they felt emotionally and physically, what their thoughts were at the time, and how they behaved. Patients will identify how their behavior at that time worked for them, as well as how it worked against them. Patients will explore possible new behaviors to use in future anger situations. Patients will learn that anger itself is normal and cannot be eliminated, and that healthier reactions can assist with resolving conflict rather than worsening situations. Patients will learn that anger is a secondary emotion and worked to identify some of the underlying feelings that may lead to anger.  Summary of Patient Progress:  Patient was present and insightful through out group session. Patient provided respectful feedback to her peers. Patient shared injustice and things being blatantly wrong trigger her anger. Patient shared in group some struggles with her mother who  walks all over the boundaries she tries to implement.   Therapeutic Modalities:   Cognitive Behavioral Therapy  Shellia Cleverly

## 2022-12-20 NOTE — Group Note (Signed)
Date:  12/20/2022 Time:  9:53 PM  Group Topic/Focus:  Wrap-Up Group:   The focus of this group is to help patients review their daily goal of treatment and discuss progress on daily workbooks.    Participation Level:  Active  Participation Quality:  Appropriate, Sharing, and Supportive  Affect:  Appropriate  Cognitive:  Appropriate  Insight: Appropriate  Engagement in Group:  Engaged and Supportive  Modes of Intervention:  Discussion, Socialization, and Support  Additional Comments:  The patient stated that she had a good day today. That patient rated her day 9 out of 10. The patient stated that her day would be a 10 out of 10 once discharged. The patient stated that she was able to interact with others throughout the day. The patient stated that her goal for today was to have a conversation with her significant other. Patient stated that the conversation went well. The patient stated that her goal for tomorrow is to work towards being DC soon.   Kennieth Francois 12/20/2022, 9:53 PM

## 2022-12-21 DIAGNOSIS — F332 Major depressive disorder, recurrent severe without psychotic features: Secondary | ICD-10-CM | POA: Diagnosis not present

## 2022-12-21 NOTE — Plan of Care (Signed)
  Problem: Education: Goal: Knowledge of [place] [facility] Education information/materials will improve Outcome: Progressing   Problem: Education: Goal: Emotional status will improve Outcome: Progressing   Problem: Education: Goal: Mental status will improve Outcome: Progressing   Problem: Activity: Goal: Interest or engagement in activities will improve Outcome: Progressing   Problem: Activity: Goal: Sleeping patterns will improve Outcome: Progressing   Problem: Safety: Goal: Periods of time without injury will increase Outcome: Progressing

## 2022-12-21 NOTE — Progress Notes (Signed)
[facility] MD Progress Note  12/21/2022 5:39 PM Sandra Montes  MRN:  [id number]  Principal Problem: MDD (major depressive disorder), recurrent episode, severe (HCC) Diagnosis: Principal Problem:   MDD (major depressive disorder), recurrent episode, severe (HCC)  Reason for admission:   This is the first psychiatric admission for this 52 year old Caucasian female with prior psychiatric history significant for major depressive order and anxiety state, who presents voluntarily to [facility] from [facility] for worsening depression leading to SI and making suicidal statement at her office at a 5 story building of " there is a nice parking lot, that I can land in," in the context of being overwhelmed with life stressors.   Yesterday the psychiatry team made the following recommendations:  -Continue Cymbalta DR capsule 40 mg p.o. daily for depression, anxiety, and chronic musculoskeletal pain -Continue hydroxyzine 25 mg p.o. 3 times daily as needed for anxiety -Continue trazodone 50 mg p.o. q. nightly as needed for insomnia -Continue Depakote ER 24-hour tablet 500 mg at bedtime for mood stabilization -Discontinue Depakote ER 24-hour tablet 250 mg in a.m. for mood stabilization due to the patient refusal  -Potassium chloride 40 mEq tablets x 2 doses only (completed) -BMP in a.m. 12/19/2022 = 3.6  On assessment today, the pt reports that their mood is euthymic, improved since admission, and stable. Denies feeling down, depressed, or sad. Patient reports she is okay with plans to meet with her therapist as soon as she is discharged from the [facility].  Added that she plans to go to family therapy with her mom, to let her know that she needs to take care of herself first and resolve all her guilt and silent suffering from the past. Patient needs shared with LCSW, who is working on finding outpatient therapy resources for patient prior to discharge.  Reports to this provider, that she had a  long conversation with her boyfriend and both agreed to take some time off from the relationship to take care of their emotional issues, with plans to come back together as boyfriend/girlfriend.    Reports that anxiety symptoms are at manageable level.  Sleep is stable. Appetite is stable.  Concentration is without complaint.  Energy level is adequate. Denies having any suicidal thoughts. Denies having any suicidal intent and plan.  Denies having any HI.  Denies having psychotic symptoms.   Denies having side effects to current psychiatric medications.   Discussed the following psychosocial stressors: Attending therapeutic milieu and unit group activities as this has proven to improve patient's mood. Discussed cessation of marijuana smoking as this has adverse effect on overall psychiatric and medical wellbeing.  Patient nods in agreement.  Discussed discharge planning:  How to identify the signs of impending crisis, use of internal coping strategies, reaching out to friends and family can help  navigate a crisis, and a list of mental health professionals and agencies to call.  Tolerating Depakote and Cymbalta well.  Denies having side effects to current psychiatric medications.  Total Time spent with patient: 45 minutes  Past Psychiatric History: Previous Psych Diagnoses: MDD, anxiety state, tobacco abuse Prior inpatient treatment: Denies Current/prior outpatient treatment: Yes as a teenager, for being belligerent. Prior rehab hx: Denies Psychotherapy hx: Yes  History of suicide: Denies History of homicide or aggression: History of aggression as a teenager Psychiatric medication history: Patient has been on trial Paxil, Wellbutrin, Zoloft, Prozac, Pristiq. Psychiatric medication compliance history: Compliant Neuromodulation history: Denies Current Psychiatrist:Denies Current therapist: Yes at [facility] with  Harriette Ohara  Past Medical History:  Past Medical History:   Diagnosis Date   HLD (hyperlipidemia)    Hypertension     Past Surgical History:  Procedure Laterality Date   CESAREAN SECTION     1993   CHOLECYSTECTOMY     1996   COLONOSCOPY WITH PROPOFOL N/A 11/18/2021   Procedure: COLONOSCOPY WITH PROPOFOL;  Surgeon: Toney Reil, MD;  Location: [facility] ENDOSCOPY;  Service: Gastroenterology;  Laterality: N/A;   TUBAL LIGATION     1995   Family History:  Family History  Problem Relation Age of Onset   Alcohol abuse Mother    Heart attack Mother 75   Skin cancer Father    Alcohol abuse Father    Prostate cancer Father    Prostate cancer Paternal Grandfather    Skin cancer Paternal Grandfather    Asthma Daughter    Mental illness Daughter        borderline   Family Psychiatric  History: See H&P  Social History:  Social History   Substance and Sexual Activity  Alcohol Use Not Currently   Comment: h/o alcoholism quit 2017     Social History   Substance and Sexual Activity  Drug Use Yes   Types: Marijuana   Comment: edibles last time 11/16/2021    Social History   Socioeconomic History   Marital status: Divorced    Spouse name: Not on file   Number of children: 3   Years of education: 12   Highest education level: 12th grade  Occupational History    Employer: [employer]    Comment: mortgage insurance  Tobacco Use   Smoking status: Every Day    Average packs/day: 1.7 packs/day for 57.0 years (95.5 ttl pk-yrs)    Types: Cigarettes    Start date: 2004   Smokeless tobacco: Never   Tobacco comments:    Pt smokes two packs cigarettes for the past 20 yrs, wants 14 mg patch  Vaping Use   Vaping status: Never Used  Substance and Sexual Activity   Alcohol use: Not Currently    Comment: h/o alcoholism quit 2017   Drug use: Yes    Types: Marijuana    Comment: edibles last time 11/16/2021   Sexual activity: Not Currently    Partners: Male    Birth control/protection: None  Other Topics Concern   Not on file   Social History Narrative   Active smoker- lives in [place]; with mother- care giver; with quality/control- mortgage.    Social Determinants of Health   Financial Resource Strain: Not on file  Food Insecurity: No Food Insecurity (12/17/2022)   Hunger Vital Sign    Worried About Running Out of Food in the Last Year: Never true    Ran Out of Food in the Last Year: Never true  Transportation Needs: No Transportation Needs (12/17/2022)   PRAPARE - Administrator, Civil Service (Medical): No    Lack of Transportation (Non-Medical): No  Physical Activity: Not on file  Stress: Not on file  Social Connections: Not on file   Additional Social History:     Sleep: Good  Appetite:  Good  Current Medications: Current Facility-Administered Medications  Medication Dose Route Frequency Provider Last Rate Last Admin   acetaminophen (TYLENOL) tablet 650 mg  650 mg Oral Q6H PRN Karie Fetch, MD   650 mg at 12/21/22 1412   albuterol (VENTOLIN HFA) 108 (90 Base) MCG/ACT inhaler 2 puff  2 puff Inhalation Q4H PRN  Karie Fetch, MD       alum & mag hydroxide-simeth (MAALOX/MYLANTA) 200-200-20 MG/5ML suspension 30 mL  30 mL Oral Q4H PRN Karie Fetch, MD       atorvastatin (LIPITOR) tablet 40 mg  40 mg Oral Daily Karie Fetch, MD   40 mg at 12/21/22 0755   diphenhydrAMINE (BENADRYL) capsule 50 mg  50 mg Oral TID PRN Karie Fetch, MD       Or   diphenhydrAMINE (BENADRYL) injection 50 mg  50 mg Intramuscular TID PRN Karie Fetch, MD       divalproex (DEPAKOTE ER) 24 hr tablet 500 mg  500 mg Oral QHS Edahi Kroening, Jesusita Oka, FNP   500 mg at 12/20/22 2115   DULoxetine (CYMBALTA) DR capsule 40 mg  40 mg Oral Daily Derrian Poli, Jesusita Oka, FNP   40 mg at 12/21/22 0754   feeding supplement (ENSURE ENLIVE / ENSURE PLUS) liquid 237 mL  237 mL Oral BID BM Rex Kras, MD       haloperidol (HALDOL) tablet 5 mg  5 mg Oral TID PRN Karie Fetch, MD       Or   haloperidol lactate (HALDOL)  injection 5 mg  5 mg Intramuscular TID PRN Karie Fetch, MD       irbesartan Evlyn Kanner) tablet 300 mg  300 mg Oral Daily Massengill, Harrold Donath, MD   300 mg at 12/21/22 8295   And   hydrochlorothiazide (HYDRODIURIL) tablet 25 mg  25 mg Oral Daily Massengill, Harrold Donath, MD   25 mg at 12/21/22 0755   hydrOXYzine (ATARAX) tablet 25 mg  25 mg Oral TID PRN Karie Fetch, MD   25 mg at 12/17/22 2106   ibuprofen (ADVIL) tablet 600 mg  600 mg Oral Q6H PRN Karie Fetch, MD   600 mg at 12/20/22 2117   LORazepam (ATIVAN) tablet 2 mg  2 mg Oral TID PRN Karie Fetch, MD       Or   LORazepam (ATIVAN) injection 2 mg  2 mg Intramuscular TID PRN Karie Fetch, MD       magnesium hydroxide (MILK OF MAGNESIA) suspension 30 mL  30 mL Oral Daily PRN Karie Fetch, MD       metoprolol succinate (TOPROL-XL) 24 hr tablet 75 mg  75 mg Oral Daily Karie Fetch, MD   75 mg at 12/19/22 6213   nicotine (NICODERM CQ - dosed in mg/24 hours) patch 14 mg  14 mg Transdermal Daily Massengill, Harrold Donath, MD   14 mg at 12/21/22 0755   traZODone (DESYREL) tablet 50 mg  50 mg Oral QHS PRN Karie Fetch, MD   50 mg at 12/20/22 2115    Lab Results:  Results for orders placed or performed during the hospital encounter of 12/17/22 (from the past 48 hour(s))  Basic metabolic panel     Status: Abnormal   Collection Time: 12/20/22  6:33 AM  Result Value Ref Range   Sodium 136 135 - 145 mmol/L   Potassium 3.6 3.5 - 5.1 mmol/L   Chloride 100 98 - 111 mmol/L   CO2 26 22 - 32 mmol/L   Glucose, Bld 97 70 - 99 mg/dL    Comment: Glucose reference range applies only to samples taken after fasting for at least 8 hours.   BUN 8 6 - 20 mg/dL   Creatinine, Ser 0.86 0.44 - 1.00 mg/dL   Calcium 8.8 (L) 8.9 - 10.3 mg/dL   GFR, Estimated >57 >84 mL/min    Comment: (NOTE) Calculated using the CKD-EPI  Creatinine Equation (2021)    Anion gap 10 5 - 15    Comment: Performed at [facility], [address],  [place], [place] [address]   Blood Alcohol level:  No results found for: "ETH"  Metabolic Disorder Labs: Lab Results  Component Value Date   HGBA1C 5.6 10/20/2022   MPG 111 07/04/2021   Lab Results  Component Value Date   PROLACTIN 4.3 01/13/2022   Lab Results  Component Value Date   CHOL 143 10/20/2022   TRIG 61.0 10/20/2022   HDL 47.40 10/20/2022   CHOLHDL 3 10/20/2022   VLDL 12.2 10/20/2022   LDLCALC 84 10/20/2022   LDLCALC 69 01/13/2022   Physical Findings: AIMS:  , ,  ,  ,    CIWA:    COWS:     Musculoskeletal: Strength & Muscle Tone: within normal limits Gait & Station: normal Patient leans: N/A  Psychiatric Specialty Exam:  Presentation  General Appearance:  Appropriate for Environment; Casual; Fairly Groomed  Eye Contact: Good  Speech: Clear and Coherent; Normal Rate  Speech Volume: Normal  Handedness: Right  Mood and Affect  Mood: Euthymic  Affect: Appropriate  Thought Process  Thought Processes: Coherent; Goal Directed  Descriptions of Associations:Intact  Orientation:Full (Time, Place and Person)  Thought Content:Logical  History of Schizophrenia/Schizoaffective disorder:No  Duration of Psychotic Symptoms:No data recorded Hallucinations:Hallucinations: None  Ideas of Reference:None  Suicidal Thoughts:Suicidal Thoughts: No SI Passive Intent and/or Plan: -- (n/a)  Homicidal Thoughts:Homicidal Thoughts: No  Sensorium  Memory: Immediate Good; Recent Good  Judgment: Good  Insight: Present  Executive Functions  Concentration: Good  Attention Span: Good  Recall: Good  Fund of Knowledge: Fair  Language: Good  Psychomotor Activity  Psychomotor Activity: Psychomotor Activity: Normal  Assets  Assets: Manufacturing systems engineer; Housing; Physical Health; Social Support; Resilience; Desire for Improvement; Financial Resources/Insurance  Sleep  Sleep: Sleep: Good Number of Hours of Sleep: 7.75  Physical  Exam: Physical Exam Vitals and nursing note reviewed.  Constitutional:      Appearance: She is obese.  HENT:     Head: Normocephalic.     Mouth/Throat:     Mouth: Mucous membranes are moist.  Eyes:     Extraocular Movements: Extraocular movements intact.     Pupils: Pupils are equal, round, and reactive to light.  Cardiovascular:     Rate and Rhythm: Bradycardia present.  Pulmonary:     Effort: Pulmonary effort is normal.  Abdominal:     Comments: Deferred  Genitourinary:    Comments: Deferred Musculoskeletal:        General: Normal range of motion.     Cervical back: Normal range of motion.  Skin:    General: Skin is warm.  Neurological:     General: No focal deficit present.     Mental Status: She is alert and oriented to person, place, and time.  Psychiatric:        Mood and Affect: Mood normal.        Behavior: Behavior normal.        Thought Content: Thought content normal.    Review of Systems  Constitutional:  Negative for chills and fever.  HENT:  Negative for sore throat.   Eyes:  Negative for blurred vision.  Respiratory:  Negative for cough, shortness of breath and wheezing.   Cardiovascular:  Negative for chest pain and palpitations.  Gastrointestinal:  Negative for abdominal pain, heartburn, nausea and vomiting.  Genitourinary: Negative.   Musculoskeletal: Negative.  Skin:  Negative for itching and rash.  Neurological:  Negative for dizziness, tingling, seizures and headaches.  Endo/Heme/Allergies:        See allergy listing  Psychiatric/Behavioral:  Positive for depression. The patient is nervous/anxious.    Blood pressure 114/79, pulse 65, temperature (!) 97.2 F (36.2 C), resp. rate 20, height 5\' 7"  (1.702 m), weight 91.2 kg, last menstrual period 10/04/2022, SpO2 99%. Body mass index is 31.48 kg/m.  Treatment Plan Summary: Daily contact with patient to assess and evaluate symptoms and progress in treatment and Medication management  Physician  Treatment Plan for Primary Diagnosis:  Assessment: MDD (major depressive disorder), recurrent episode, severe (HCC)   Plan: Medication: -Continue Cymbalta DR capsule 40 mg p.o. daily for depression, anxiety, and chronic musculoskeletal pain. -Continue hydroxyzine 25 mg p.o. 3 times daily as needed for anxiety -Continue trazodone 50 mg p.o. q. nightly as needed for insomnia -Continue Depakote ER 24-hour tablet 500 mg at bedtime for mood stabilization -Discontinue Depakote ER 24-hour tablet 250 mg in a.m. for mood stabilization due to the patient refusal  -Potassium chloride 40 mEq tablets x 2 doses only (completed) -BMP in a.m. 12/19/2022 = 3.6   Medication for other medical problems -Albuterol 108 90 days MCG/ACT inhaler 2 puffs every 4 hours as needed for -wheezing or shortness of breath -Lipitor tablet 40 mg p.o. daily for hyperlipidemia -Ibuprofen tablets 600 mg p.o. every 6 hours as needed for mild pain alternate with Tylenol for knee pain -Avapro tablet 300 mg p.o. daily for high blood pressure and -Hydrochlorothiazide tablets 25 mg p.o. daily for high blood pressure -Metoprolol succinate XL 24-hour tablet 75 mg p.o. daily for high blood pressure -Nicotine patch 14 mg transdermal over 24 hours daily for smoking cessation   Agitation protocol: Benadryl capsule 50 mg p.o. or IM 3 times daily as needed agitation   Haldol tablets 5 mg po IM 3 times daily as needed agitation   Lorazepam tablet 2 mg p.o. or IM 3 times daily as needed agitation     Other PRN Medications -Acetaminophen 650 mg every 6 as needed/mild pain -Maalox 30 mL oral every 4 as needed/digestion -Magnesium hydroxide 30 mL daily as needed/mild constipation   Labs reviewed: CMP: Potassium 3.0 replaced with 40 mEq potassium chloride x 2 doses; chloride 97, total bilirubin 1.4.  Cardiac profile: Within normal limits.  Lipid profile: Within normal limits.  CBC with differential red blood cell 5.23 high, hemoglobin 16.3  high.  Hemoglobin A1c 5.6 within normal limits.  Autoimmune: ANA positive, ANA pattern 1 nuclear homogeneous, ANA titer 1 1: 8 0 titer, Urine drug test: Positive for marijuana.   BMP: Result:   12/19/22 Depakote level ordered results at this time: < 10, next Depakote level on 12/23/22   EKG: Sinus bradycardia, ventricular rate 58, AST and T wave abnormalities; consider inferior and anterolateral ischemia.  QT/QTc 440/431   Safety and Monitoring: Voluntary admission to inpatient psychiatric unit for safety, stabilization and treatment Daily contact with patient to assess and evaluate symptoms and progress in treatment Patient's case to be discussed in multi-disciplinary team meeting Observation Level : q15 minute checks Vital signs: q12 hours Precautions: suicide, but pt currently verbally contracts for safety on unit    Discharge Planning: Social work and case management to assist with discharge planning and identification of hospital follow-up needs prior to discharge Estimated LOS: 5-7 days Discharge Concerns: Need to establish a safety plan; Medication compliance and effectiveness Discharge Goals: Return home with outpatient  referrals for mental health follow-up including medication management/psychotherapy.   -- The risks/benefits/side-effects/alternatives to this medication were discussed in detail with the patient and time was given for questions. The patient consents to medication trial.              -- Encouraged patient to participate  in unit milieu and in scheduled group therapies    Long Term Goal(s): Improvement in symptoms so as ready for discharge   Short Term Goals: Ability to identify changes in lifestyle to reduce recurrence of condition will improve, Ability to verbalize feelings will improve, Ability to disclose and discuss suicidal ideas, Ability to demonstrate self-control will improve, Ability to identify and develop effective coping behaviors will improve, Ability  to maintain clinical measurements within normal limits will improve, Compliance with prescribed medications will improve, and Ability to identify triggers associated with substance abuse/mental health issues will improve   Physician Treatment Plan for Secondary Diagnosis: Principal Problem:   MDD (major depressive disorder), recurrent episode, severe (HCC)   I certify that inpatient services furnished can reasonably be expected to improve the patient's condition.      Cecilie Lowers, FNP 12/21/2022, 5:39 PM Patient ID: Sandra Montes, female   DOB: 02-14-1971, 52 y.o.   MRN: [id number] Patient ID: Sandra Montes, female   DOB: July 23, 1970, 53 y.o.   MRN: [id number]

## 2022-12-21 NOTE — Progress Notes (Signed)
   12/21/22 0612  15 Minute Checks  Location Bedroom  Visual Appearance Calm  Behavior Sleeping  Sleep (Behavioral Health Patients Only)  Calculate sleep? (Click Yes once per 24 hr at 0600 safety check) Yes  Documented sleep last 24 hours 7.25

## 2022-12-21 NOTE — Plan of Care (Signed)
  Problem: Education: Goal: Emotional status will improve Outcome: Progressing   Problem: Education: Goal: Mental status will improve Outcome: Progressing   Problem: Activity: Goal: Interest or engagement in activities will improve Outcome: Progressing   Problem: Coping: Goal: Ability to verbalize frustrations and anger appropriately will improve Outcome: Progressing   Problem: Safety: Goal: Periods of time without injury will increase Outcome: Progressing   Problem: Education: Goal: Ability to state activities that reduce stress will improve Outcome: Progressing

## 2022-12-21 NOTE — Group Note (Signed)
Recreation Therapy Group Note   Group Topic:Stress Management  Group Date: 12/21/2022 Start Time: 0930 End Time: 1000 Facilitators: Syrenity Klepacki-McCall, LRT,CTRS Location: [address] Dayroom   Goal Area(s) Addresses:  Patient will actively participate in stress management techniques presented during session.  Patient will successfully identify benefit of practicing stress management post d/c.   Group Description:  Guided Imagery. LRT provided education, instruction, and demonstration on practice of visualization via guided imagery. Patient was asked to participate in the technique introduced during session. LRT debriefed including topics of mindfulness, stress management and specific scenarios each patient could use these techniques. Patients were given suggestions of ways to access scripts post d/c and encouraged to explore Youtube and other apps available on smartphones, tablets, and computers.   Clinical Observations/Individualized Feedback: Unable to conduct group session due to maintenance being completed in dayroom.     Plan: Continue to engage patient in RT group sessions 2-3x/week.   Amahia Madonia-McCall, LRT,CTRS 12/21/2022 11:53 AM

## 2022-12-21 NOTE — Progress Notes (Signed)
Patient rated her depression level 2/10 and her anxiety level 0/10 with 10 being the highest and 0 none. Patient stated her goal for today as," Discharge plan". Pt is medication and group compliant. Observed interacting well with peers. Pt denied SI/HI and AVH.Safety maintained.  12/21/22 0804  Psych Admission Type (Psych Patients Only)  Admission Status Voluntary  Psychosocial Assessment  Patient Complaints Depression  Eye Contact Fair  Facial Expression Anxious  Affect Appropriate to circumstance;Anxious  Speech Logical/coherent  Interaction Assertive  Motor Activity Other (Comment) (wnl)  Appearance/Hygiene Unremarkable  Behavior Characteristics Cooperative;Appropriate to situation  Mood Anxious;Pleasant  Thought Process  Coherency WDL  Content WDL  Delusions None reported or observed  Perception WDL  Hallucination None reported or observed  Judgment Impaired  Confusion None  Danger to Self  Current suicidal ideation? Denies  Agreement Not to Harm Self Yes  Description of Agreement Verbal  Danger to Others  Danger to Others None reported or observed

## 2022-12-21 NOTE — Group Note (Signed)
Date:  12/21/2022 Time:  6:10 PM  Group Topic/Focus:  Goals Group:   The focus of this group is to help patients establish daily goals to achieve during treatment and discuss how the patient can incorporate goal setting into their daily lives to aide in recovery.    Participation Level:  Did Not Attend    Sandra Montes 12/21/2022, 6:10 PM

## 2022-12-22 DIAGNOSIS — F332 Major depressive disorder, recurrent severe without psychotic features: Secondary | ICD-10-CM | POA: Diagnosis not present

## 2022-12-22 MED ORDER — METOPROLOL SUCCINATE ER 25 MG PO TB24: 75.0000 mg | ORAL_TABLET | Freq: Every day | ORAL | 0 refills | Status: DC

## 2022-12-22 MED ORDER — DIVALPROEX SODIUM ER 500 MG PO TB24: 500.0000 mg | ORAL_TABLET | Freq: Every day | ORAL | 0 refills | Status: DC

## 2022-12-22 MED ORDER — ATORVASTATIN CALCIUM 40 MG PO TABS: 40.0000 mg | ORAL_TABLET | Freq: Every day | ORAL | 0 refills | Status: DC

## 2022-12-22 MED ORDER — HYDROXYZINE HCL 25 MG PO TABS: 25.0000 mg | ORAL_TABLET | Freq: Three times a day (TID) | ORAL | 0 refills | Status: DC | PRN

## 2022-12-22 MED ORDER — NICOTINE 14 MG/24HR TD PT24: 14.0000 mg | MEDICATED_PATCH | Freq: Every day | TRANSDERMAL | 0 refills | Status: DC

## 2022-12-22 MED ORDER — DULOXETINE HCL 40 MG PO CPEP: 40.0000 mg | ORAL_CAPSULE | Freq: Every day | ORAL | 3 refills | Status: DC

## 2022-12-22 MED ORDER — IRBESARTAN 300 MG PO TABS: 300.0000 mg | ORAL_TABLET | Freq: Every day | ORAL | 0 refills | Status: DC

## 2022-12-22 MED ORDER — HYDROCHLOROTHIAZIDE 25 MG PO TABS: 25.0000 mg | ORAL_TABLET | Freq: Every day | ORAL | 0 refills | Status: DC
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
## 2022-12-22 NOTE — Group Note (Signed)
Recreation Therapy Group Note   Group Topic:Animal Assisted Therapy   Group Date: 12/22/2022 Start Time: 0945 End Time: 1030 Facilitators: Rosalind Guido-McCall, LRT,CTRS Location: [address] Dayroom   Animal-Assisted Activity (AAA) Program Checklist/Progress Notes Patient Eligibility Criteria Checklist & Daily Group note for Rec Tx Intervention  AAA/T Program Assumption of Risk Form signed by Patient/ or Parent Legal Guardian Yes  Patient understands his/her participation is voluntary Yes   Affect/Mood: N/A   Participation Level: Did not attend    Clinical Observations/Individualized Feedback:     Plan: Continue to engage patient in RT group sessions 2-3x/week.   Sailor Hevia-McCall, LRT,CTRS  12/22/2022 1:04 PM

## 2022-12-22 NOTE — Progress Notes (Signed)
   12/21/22 2110  Psych Admission Type (Psych Patients Only)  Admission Status Voluntary  Psychosocial Assessment  Patient Complaints Depression  Eye Contact Fair  Facial Expression Anxious  Affect Appropriate to circumstance  Speech Logical/coherent  Interaction Assertive  Motor Activity Other (Comment)  Appearance/Hygiene Unremarkable  Behavior Characteristics Cooperative;Appropriate to situation  Mood Anxious;Pleasant  Thought Process  Coherency WDL  Content WDL  Delusions None reported or observed  Perception WDL  Hallucination None reported or observed  Judgment Impaired  Confusion None  Danger to Self  Current suicidal ideation? Denies  Agreement Not to Harm Self Yes  Description of Agreement Verbal  Danger to Others  Danger to Others None reported or observed

## 2022-12-22 NOTE — Group Note (Signed)
[facility] LCSW Group Therapy Note   Group Date: 12/22/2022 Start Time: 1100 End Time: 1140   Type of Therapy/Topic:  Group Therapy:  Emotion Regulation  Participation Level:  Did Not Attend   Mood:  Description of Group:    The purpose of this group is to assist patients in learning to regulate negative emotions and experience positive emotions. Patients will be guided to discuss ways in which they have been vulnerable to their negative emotions. These vulnerabilities will be juxtaposed with experiences of positive emotions or situations, and patients challenged to use positive emotions to combat negative ones. Special emphasis will be placed on coping with negative emotions in conflict situations, and patients will process healthy conflict resolution skills.  Therapeutic Goals: Patient will identify two positive emotions or experiences to reflect on in order to balance out negative emotions:  Patient will label two or more emotions that they find the most difficult to experience:  Patient will be able to demonstrate positive conflict resolution skills through discussion or role plays:   Summary of Patient Progress:       Therapeutic Modalities:   Cognitive Behavioral Therapy Feelings Identification Dialectical Behavioral Therapy   Starleen Arms, LCSW

## 2022-12-22 NOTE — [facility] Counselor (Signed)
  12/22/2022  9:53 AM   Sandra Montes  CSW provided a work note to Pt.  Signed:  Marya Landry MSW, [facility] 12/22/2022  9:53 AM

## 2022-12-22 NOTE — Plan of Care (Signed)
  Problem: Education: Goal: Knowledge of [facility] Education information/materials will improve Outcome: Progressing   Problem: Education: Goal: Emotional status will improve Outcome: Progressing   Problem: Education: Goal: Mental status will improve Outcome: Progressing   Problem: Activity: Goal: Sleeping patterns will improve Outcome: Progressing   Problem: Safety: Goal: Periods of time without injury will increase Outcome: Progressing   Problem: Education: Goal: Ability to state activities that reduce stress will improve Outcome: Progressing   Problem: Education: Goal: Understanding of discharge needs will improve Outcome: Progressing

## 2022-12-22 NOTE — [facility] Suicide Risk Assessment (Signed)
[facility] INPATIENT:  Family/Significant Other Suicide Prevention Education  Suicide Prevention Education:  Education Completed; Abe People, daughter, [phone], has been identified by the patient as the family member/significant other with whom the patient will be residing, and identified as the person(s) who will aid the patient in the event of a mental health crisis (suicidal ideations/suicide attempt).  With written consent from the patient, the family member/significant other has been provided the following suicide prevention education, prior to the and/or following the discharge of the patient.  The suicide prevention education provided includes the following: Suicide risk factors Suicide prevention and interventions National Suicide Hotline telephone number [facility] assessment telephone number [facility] Emergency Assistance 911 [facility] and/or Residential Mobile Crisis Unit telephone number  Request made of family/significant other to: Remove weapons (e.g., guns, rifles, knives), all items previously/currently identified as safety concern.   Remove drugs/medications (over-the-counter, prescriptions, illicit drugs), all items previously/currently identified as a safety concern.  The family member/significant other verbalizes understanding of the suicide prevention education information provided.  The family member/significant other agrees to remove the items of safety concern listed above.  Izell [place] 12/22/2022, 9:50 AM

## 2022-12-22 NOTE — Progress Notes (Signed)
Patient is discharging at this time. Patient is A&Ox4. Stable. Patient denies SI,HI, and A/V/H with no plan/intent. Printed AVS reviewed with and given to patient along with medications and follow up appointments. Suicide safety plan complete with copy provided to patient. Original form in chart. Patient verbalized all understanding. All valuables/belongings returned to patient. Patient denies any pain/discomfort. No s/s of current distress.

## 2022-12-22 NOTE — Progress Notes (Signed)
  [facility] Adult Case Management Discharge Plan :  Will you be returning to the same living situation after discharge:  Yes,  Pt will be returning to her home. At discharge, do you have transportation home?: Yes,  Pts car is at [facility] and she will be driving herself home. Do you have the ability to pay for your medications: Yes,  [employer].  Release of information consent forms completed and in the chart;  Patient's signature needed at discharge.  Patient to Follow up at:  Follow-up Information     [facility] Follow up on 12/24/2022.   Why: You have an appointment with your therapist on 12/24/22 at 8:30 am.  Please schedule an appointment for medication management services at this time, if necessary. Contact information: [address] [place],  [place]  [address]  Main: [phone] Fax: [phone]                Next level of care provider has access to [facility] Link:no  Safety Planning and Suicide Prevention discussed: Fonda Kinder, daughter, [phone]     Has patient been referred to the Quitline?: Patient refused referral for treatment  Patient has been referred for addiction treatment: No known substance use disorder.  Izell [place], LCSW 12/22/2022, 9:51 AM
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
## 2022-12-22 NOTE — Progress Notes (Signed)
   12/22/22 0527  15 Minute Checks  Location Bedroom  Visual Appearance Calm  Behavior Sleeping  Sleep (Behavioral Health Patients Only)  Calculate sleep? (Click Yes once per 24 hr at 0600 safety check) Yes  Documented sleep last 24 hours 7.25

## 2022-12-22 NOTE — Discharge Summary (Addendum)
Physician Discharge Summary Note  Patient:  Sandra Montes is an 52 y.o., female MRN:  [id number] DOB:  Mar 17, 1971 Patient phone:  [phone] (home)  Patient address:   [address] [place] [address],  Total Time spent with patient: 30 minutes  Date of Admission:  12/17/2022 Date of Discharge:   12/22/2022  Reason for Admission:   This is the first psychiatric admission for this 52 year old Caucasian female with prior psychiatric history significant for major depressive order and anxiety state, who presents voluntarily to [facility] from [facility] for worsening depression leading to SI and making suicidal statement at her office at a 5 story building of " there is a nice parking lot, that I can land in," in the context of being overwhelmed with life stressors.   Principal Problem: MDD (major depressive disorder), recurrent episode, severe (HCC) Discharge Diagnoses: Principal Problem:   MDD (major depressive disorder), recurrent episode, severe (HCC)  Past Psychiatric History: Previous Psych Diagnoses: MDD, anxiety state, tobacco abuse Prior inpatient treatment: Denies Current/prior outpatient treatment: Yes as a teenager, for being belligerent. Prior rehab hx: Denies Psychotherapy hx: Yes  History of suicide: Denies History of homicide or aggression: History of aggression as a teenager Psychiatric medication history: Patient has been on trial Paxil, Wellbutrin, Zoloft, Prozac, Pristiq. Psychiatric medication compliance history: Compliant Neuromodulation history: Denies Current Psychiatrist:Denies Current therapist: Yes at [facility] with Harriette Ohara   Past Medical History:  Past Medical History:  Diagnosis Date   HLD (hyperlipidemia)    Hypertension     Past Surgical History:  Procedure Laterality Date   CESAREAN SECTION     1993   CHOLECYSTECTOMY     1996   COLONOSCOPY WITH PROPOFOL N/A 11/18/2021   Procedure: COLONOSCOPY WITH  PROPOFOL;  Surgeon: Toney Reil, MD;  Location: [facility] ENDOSCOPY;  Service: Gastroenterology;  Laterality: N/A;   TUBAL LIGATION     1995   Family History:  Family History  Problem Relation Age of Onset   Alcohol abuse Mother    Heart attack Mother 30   Skin cancer Father    Alcohol abuse Father    Prostate cancer Father    Prostate cancer Paternal Grandfather    Skin cancer Paternal Grandfather    Asthma Daughter    Mental illness Daughter        borderline   Family Psychiatric  History: See H&P Social History:  Social History   Substance and Sexual Activity  Alcohol Use Not Currently   Comment: h/o alcoholism quit 2017     Social History   Substance and Sexual Activity  Drug Use Yes   Types: Marijuana   Comment: edibles last time 11/16/2021    Social History   Socioeconomic History   Marital status: Divorced    Spouse name: Not on file   Number of children: 3   Years of education: 12   Highest education level: 12th grade  Occupational History    Employer: [employer]    Comment: mortgage insurance  Tobacco Use   Smoking status: Every Day    Average packs/day: 1.7 packs/day for 57.0 years (95.5 ttl pk-yrs)    Types: Cigarettes    Start date: 2004   Smokeless tobacco: Never   Tobacco comments:    Pt smokes two packs cigarettes for the past 20 yrs, wants 14 mg patch  Vaping Use   Vaping status: Never Used  Substance and Sexual Activity   Alcohol use: Not Currently  Comment: h/o alcoholism quit 2017   Drug use: Yes    Types: Marijuana    Comment: edibles last time 11/16/2021   Sexual activity: Not Currently    Partners: Male    Birth control/protection: None  Other Topics Concern   Not on file  Social History Narrative   Active smoker- lives in [place]; with mother- care giver; with quality/control- mortgage.    Social Determinants of Health   Financial Resource Strain: Not on file  Food Insecurity: No Food Insecurity (12/17/2022)    Hunger Vital Sign    Worried About Running Out of Food in the Last Year: Never true    Ran Out of Food in the Last Year: Never true  Transportation Needs: No Transportation Needs (12/17/2022)   PRAPARE - Administrator, Civil Service (Medical): No    Lack of Transportation (Non-Medical): No  Physical Activity: Not on file  Stress: Not on file  Social Connections: Not on file    Hospital Course:  During the patient's hospitalization, patient had extensive initial psychiatric evaluation, and follow-up psychiatric evaluations every day.  Psychiatric diagnoses provided upon initial assessment:       MDD (major depressive disorder), recurrent episode, severe (HCC)   Patient's psychiatric medications were adjusted on admission:  Continue Cymbalta DR capsule 30 mg p.o. daily for depression and anxiety -Continue hydroxyzine 25 mg p.o. 3 times daily as needed for anxiety -Continue trazodone 50 mg p.o. q. nightly as needed for insomnia -Initiate Depakote ER 24-hour tablet 250 mg every 12 hours for mood stabilization -Initiate potassium chloride 40 mEq tablets x 2 doses only.  BMP in a.m. 12/19/2022  During the hospitalization, other adjustments were made to the patient's psychiatric medication regimen:  Cymbalta increased to 40 mg p.o. daily for depression and anxiety Depakote increased to 500 mg p.o. q. nightly for mood stabilization  Patient's care was discussed during the interdisciplinary team meeting every day during the hospitalization.  The patient denies having side effects to prescribed psychiatric medication.  Gradually, patient started adjusting to milieu. The patient was evaluated each day by a clinical provider to ascertain response to treatment. Improvement was noted by the patient's report of decreasing symptoms, improved sleep and appetite, affect, medication tolerance, behavior, and participation in unit programming.  Patient was asked each day to complete a self  inventory noting mood, mental status, pain, new symptoms, anxiety and concerns.    Symptoms were reported as significantly decreased or resolved completely by discharge.   On day of discharge, the patient reports that their mood is stable. The patient denied having suicidal thoughts for more than 48 hours prior to discharge.  Patient denies having homicidal thoughts.  Patient denies having auditory hallucinations.  Patient denies any visual hallucinations or other symptoms of psychosis. The patient was motivated to continue taking medication with a goal of continued improvement in mental health.   The patient reports their target psychiatric symptoms of depression responded well to the psychiatric medications, and the patient reports overall benefit other psychiatric hospitalization. Supportive psychotherapy was provided to the patient. The patient also participated in regular group therapy while hospitalized. Coping skills, problem solving as well as relaxation therapies were also part of the unit programming.  Labs were reviewed with the patient, and abnormal results were discussed with the patient.  The patient is able to verbalize their individual safety plan to this provider.  # It is recommended to the patient to continue psychiatric medications as prescribed, after discharge  from the hospital.    # It is recommended to the patient to follow up with your outpatient psychiatric provider and PCP.  # It was discussed with the patient, the impact of alcohol, drugs, tobacco have been there overall psychiatric and medical wellbeing, and total abstinence from substance use was recommended the patient.ed.  # Prescriptions provided or sent directly to preferred pharmacy at discharge. Patient agreeable to plan. Given opportunity to ask questions. Appears to feel comfortable with discharge.    # In the event of worsening symptoms, the patient is instructed to call the crisis hotline, 911 and or go to  the nearest ED for appropriate evaluation and treatment of symptoms. To follow-up with primary care provider for other medical issues, concerns and or health care needs  # Patient was discharged to home with a plan to follow up as noted below.   Of note, patient is to follow-up with outpatient PCP for blood drawn for Depakote level.  Physical Findings: AIMS:  , ,  ,  ,    CIWA:    COWS:     Musculoskeletal: Strength & Muscle Tone: within normal limits Gait & Station: normal Patient leans: N/A   Psychiatric Specialty Exam:  Presentation  General Appearance:  Appropriate for Environment  Eye Contact: Good  Speech: Clear and Coherent; Normal Rate  Speech Volume: Normal  Handedness: Right  Mood and Affect  Mood: Euthymic  Affect: Appropriate; Congruent  Thought Process  Thought Processes: Coherent; Goal Directed  Descriptions of Associations:Intact  Orientation:Full (Time, Place and Person)  Thought Content:Logical  History of Schizophrenia/Schizoaffective disorder:No  Duration of Psychotic Symptoms:No data recorded Hallucinations:Hallucinations: None  Ideas of Reference:None  Suicidal Thoughts:Suicidal Thoughts: No SI Passive Intent and/or Plan: -- (n/a)  Homicidal Thoughts:Homicidal Thoughts: No  Sensorium  Memory: Immediate Good; Recent Good  Judgment: Good  Insight: Present  Executive Functions  Concentration: Good  Attention Span: Good  Recall: Good  Fund of Knowledge: Fair  Language: Good  Psychomotor Activity  Psychomotor Activity: Psychomotor Activity: Normal  Assets  Assets: Communication Skills; Desire for Improvement; Housing; Physical Health; Resilience  Sleep  Sleep: Sleep: Good Number of Hours of Sleep: 7    Physical Exam: Physical Exam Vitals and nursing note reviewed.  Constitutional:      Appearance: She is obese.  HENT:     Head: Normocephalic.     Nose: Nose normal.     Mouth/Throat:      Mouth: Mucous membranes are moist.  Eyes:     Pupils: Pupils are equal, round, and reactive to light.  Cardiovascular:     Rate and Rhythm: Normal rate.     Pulses: Normal pulses.  Pulmonary:     Effort: Pulmonary effort is normal.  Abdominal:     Comments: Deferred  Genitourinary:    Comments: Deferred Musculoskeletal:        General: Normal range of motion.     Cervical back: Normal range of motion.  Skin:    General: Skin is warm.  Neurological:     General: No focal deficit present.     Mental Status: She is alert and oriented to person, place, and time.  Psychiatric:        Mood and Affect: Mood normal.        Behavior: Behavior normal.        Thought Content: Thought content normal.    Review of Systems  Constitutional:  Negative for chills and fever.  HENT:  Negative for hearing  loss and sore throat.   Eyes:  Negative for blurred vision.  Respiratory:  Negative for cough, shortness of breath and wheezing.   Cardiovascular:  Negative for chest pain and palpitations.  Gastrointestinal:  Negative for abdominal pain, heartburn, nausea and vomiting.  Genitourinary: Negative.   Musculoskeletal: Negative.   Skin:  Negative for itching and rash.  Neurological:  Negative for dizziness, tingling and headaches.  Endo/Heme/Allergies:        See allergy listing  Psychiatric/Behavioral:  Positive for depression (Stable with medications). The patient is nervous/anxious (Improved with medication) and has insomnia (Improved with medication).    Blood pressure 117/79, pulse 72, temperature 97.9 F (36.6 C), temperature source Oral, resp. rate 20, height 5\' 7"  (1.702 m), weight 91.2 kg, last menstrual period 10/04/2022, SpO2 99%. Body mass index is 31.48 kg/m.   Social History   Tobacco Use  Smoking Status Every Day   Average packs/day: 1.7 packs/day for 57.0 years (95.5 ttl pk-yrs)   Types: Cigarettes   Start date: 2004  Smokeless Tobacco Never  Tobacco Comments   Pt  smokes two packs cigarettes for the past 20 yrs, wants 14 mg patch   Tobacco Cessation:  A prescription for an FDA-approved tobacco cessation medication provided at discharge   Blood Alcohol level:  No results found for: "ETH"  Metabolic Disorder Labs:  Lab Results  Component Value Date   HGBA1C 5.6 10/20/2022   MPG 111 07/04/2021   Lab Results  Component Value Date   PROLACTIN 4.3 01/13/2022   Lab Results  Component Value Date   CHOL 143 10/20/2022   TRIG 61.0 10/20/2022   HDL 47.40 10/20/2022   CHOLHDL 3 10/20/2022   VLDL 12.2 10/20/2022   LDLCALC 84 10/20/2022   LDLCALC 69 01/13/2022    See Psychiatric Specialty Exam and Suicide Risk Assessment completed by Attending Physician prior to discharge.  Discharge destination:  Home  Is patient on multiple antipsychotic therapies at discharge:  No   Has Patient had three or more failed trials of antipsychotic monotherapy by history:  No  Recommended Plan for Multiple Antipsychotic Therapies: NA  Discharge Instructions     Increase activity slowly   Complete by: As directed       Allergies as of 12/22/2022       Reactions   Buspar [buspirone] Other (See Comments)   Optical migraines   Lisinopril Cough   Morphine Itching, Other (See Comments)   "tried to take her face off due to itching"   Sulfa Antibiotics Other (See Comments)   Passes out        Medication List     STOP taking these medications    acetaminophen 650 MG CR tablet Commonly known as: TYLENOL   cetirizine 10 MG tablet Commonly known as: ZYRTEC   Cinnamon 500 MG capsule   Fish Oil 1000 MG Caps   ibuprofen 200 MG tablet Commonly known as: ADVIL   metroNIDAZOLE 0.75 % cream Commonly known as: METROCREAM   nicotine 7 mg/24hr patch Commonly known as: NICODERM CQ - dosed in mg/24 hr Replaced by: nicotine 14 mg/24hr patch   OVER THE COUNTER MEDICATION   QC Tumeric Complex 500 MG Caps Generic drug: Turmeric   SLOW  MAGNESIUM/CALCIUM PO   Stiolto Respimat 2.5-2.5 MCG/ACT Aers Generic drug: Tiotropium Bromide-Olodaterol   valsartan-hydrochlorothiazide 320-25 MG tablet Commonly known as: DIOVAN-HCT   Vitamin B-Complex Tabs       TAKE these medications      Indication  albuterol  108 (90 Base) MCG/ACT inhaler Commonly known as: VENTOLIN HFA Inhale 2 puffs into the lungs every 4 (four) hours as needed for wheezing or shortness of breath.  Indication: Asthma   atorvastatin 40 MG tablet Commonly known as: LIPITOR Take 1 tablet (40 mg total) by mouth daily. Start taking on: December 23, 2022  Indication: High Amount of Fats in the Blood   divalproex 500 MG 24 hr tablet Commonly known as: DEPAKOTE ER Take 1 tablet (500 mg total) by mouth at bedtime.  Indication: Depressive Phase of Manic-Depression   DULoxetine HCl 40 MG Cpep Take 1 capsule (40 mg total) by mouth daily. Start taking on: December 23, 2022 What changed:  medication strength how much to take  Indication: Major Depressive Disorder   hydrochlorothiazide 25 MG tablet Commonly known as: HYDRODIURIL Take 1 tablet (25 mg total) by mouth daily. Start taking on: December 23, 2022  Indication: High Blood Pressure Disorder   hydrOXYzine 25 MG tablet Commonly known as: ATARAX Take 1 tablet (25 mg total) by mouth 3 (three) times daily as needed for anxiety.  Indication: Feeling Anxious   irbesartan 300 MG tablet Commonly known as: AVAPRO Take 1 tablet (300 mg total) by mouth daily. Start taking on: December 23, 2022  Indication: High Blood Pressure Disorder   metoprolol succinate 25 MG 24 hr tablet Commonly known as: TOPROL-XL Take 3 tablets (75 mg total) by mouth daily. Start taking on: December 23, 2022 What changed:  medication strength how much to take how to take this when to take this additional instructions Another medication with the same name was removed. Continue taking this medication, and follow the directions you  see here.  Indication: High Blood Pressure Disorder   nicotine 14 mg/24hr patch Commonly known as: NICODERM CQ - dosed in mg/24 hours Place 1 patch (14 mg total) onto the skin daily. Start taking on: December 23, 2022 Replaces: nicotine 7 mg/24hr patch  Indication: Nicotine Addiction   traZODone 50 MG tablet Commonly known as: DESYREL Take 1 tablet (50 mg total) by mouth at bedtime as needed for sleep.  Indication: Trouble Sleeping        Follow-up Information     [facility] Follow up on 12/24/2022.   Why: You have an appointment with your therapist on 12/24/22 at 8:30 am.  Please schedule an appointment for medication management services at this time, if necessary. Contact information: [address] [place],  [place]  [address]  Main: [phone] Fax: [phone]               Follow-up recommendations:    Discharge Recommendations:  The patient is being discharged with her family. Patient is to take her discharge medications as ordered.  See follow up above. We recommend that she participates in individual therapy to target uncontrollable agitation and substance abuse.  We recommend that she participates in therapy to target the conflict with her family, to improve communication skills and conflict resolution skills.  patient is to initiate/implement a contingency based behavioral model to address patient's behavior. We recommend that she gets AIMS scale, height, weight, blood pressure, fasting lipid panel, fasting blood sugar in three months from discharge if she's on atypical antipsychotics.  Patient will benefit from monitoring of recurrent suicidal ideation since patient is on antidepressant medication. The patient should abstain from all illicit substances and alcohol. If the patient's symptoms worsen or do not continue to improve or if the patient becomes  actively suicidal or homicidal then it is recommended that  the patient return to the closest hospital emergency room or call 911 for further evaluation and treatment. National Suicide Prevention Lifeline 1800-SUICIDE or 431 071 9541. Please follow up with your primary medical doctor for all other medical needs.  The patient has been educated on the possible side effects to medications and she/her guardian is to contact a medical professional and inform outpatient provider of any new side effects of medication. She is to take regular diet and activity as tolerated.  Will benefit from moderate daily exercise. Patient was educated about removing/locking any firearms, medications or dangerous products from the home.  Activity:  As tolerated Diet:  Regular Diet Signed: Cecilie Lowers, FNP 12/22/2022, 11:59 AM

## 2022-12-23 ENCOUNTER — Other Ambulatory Visit (HOSPITAL_COMMUNITY): Payer: Self-pay

## 2022-12-23 NOTE — Telephone Encounter (Signed)
Lvm for patient tcb and schedule 

## 2022-12-23 NOTE — Telephone Encounter (Signed)
Spoke to pt, scheduled ov for 12/30/22. Pt stated she'd bring in her fmla ppw tomorrow, 8/22, when she comes in for her therapy session with karen sharpe.

## 2022-12-23 NOTE — Telephone Encounter (Signed)
We can work her into a same day appt if availabe soon.

## 2022-12-24 ENCOUNTER — Ambulatory Visit: Payer: 59 | Admitting: Clinical

## 2022-12-24 ENCOUNTER — Telehealth: Payer: Self-pay | Admitting: Family

## 2022-12-24 DIAGNOSIS — F411 Generalized anxiety disorder: Secondary | ICD-10-CM

## 2022-12-24 DIAGNOSIS — Z0279 Encounter for issue of other medical certificate: Secondary | ICD-10-CM

## 2022-12-24 DIAGNOSIS — F439 Reaction to severe stress, unspecified: Secondary | ICD-10-CM | POA: Diagnosis not present

## 2022-12-24 DIAGNOSIS — F332 Major depressive disorder, recurrent severe without psychotic features: Secondary | ICD-10-CM
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
## 2022-12-24 NOTE — Telephone Encounter (Signed)
Patient dropped off document FMLA, to be filled out by provider. Patient requested to send it back via Call Patient to pick up within ASAP. Document is located in providers tray at front office.Please advise at Mobile [phone] (mobile)

## 2022-12-24 NOTE — Telephone Encounter (Signed)
Form will be placed in provider's box for completion;  Christy Gentles

## 2022-12-24 NOTE — Progress Notes (Signed)
                Karen Sharpe, LCSW 

## 2022-12-29 ENCOUNTER — Ambulatory Visit: Payer: 59 | Admitting: Orthopedic Surgery

## 2022-12-29 ENCOUNTER — Other Ambulatory Visit: Payer: Self-pay

## 2022-12-29 ENCOUNTER — Encounter: Payer: Self-pay | Admitting: Orthopedic Surgery

## 2022-12-29 DIAGNOSIS — M25561 Pain in right knee: Secondary | ICD-10-CM

## 2022-12-29 DIAGNOSIS — G8929 Other chronic pain: Secondary | ICD-10-CM | POA: Diagnosis not present

## 2022-12-29 DIAGNOSIS — M25461 Effusion, right knee: Secondary | ICD-10-CM | POA: Diagnosis not present

## 2022-12-29 NOTE — Patient Instructions (Signed)
Medicines as needed  Ice the knee if swollen or painful  Brace or compression sleeve as needed  No restriction on activities  We will place a referral for physical therapy  If your knee starts to bother you, and the swelling worsens, please come back to clinic.  Otherwise, recommend 4-6 weeks of physical therapy.  If you do not see improvement with therapy, medicines and icing as needed, we can discuss obtaining an MRI of your right knee.

## 2022-12-29 NOTE — Progress Notes (Signed)
New Patient Visit  Assessment: Sandra Montes is a 52 y.o. female with the following: 1. Chronic pain of right knee 2.  Right knee effusion  Plan: Sandra Montes has mild pain, and persistent swelling in the right knee.  No specific injury.  Her knee has been bothering her for few years.  She has been using a brace.  She had an injection a little over 2 months ago.  I am reluctant to aspirate the knee at this time.  Okay for her to remove the braces, and only use them as needed.  Consider NSAIDs and icing as needed.  I would like her to work with physical therapy.  If her symptoms do not get better, we can consider an MRI.  She states her understanding.  Follow-up as needed.  Follow-up: Return if symptoms worsen or fail to improve.  Subjective:  Chief Complaint  Patient presents with   Knee Pain    Right knee pain     History of Present Illness: Sandra Montes is a 52 y.o. female who presents for evaluation of right knee pain and swelling.  She states that she is dealt with issues with her right knee since 2021.  No specific injury at that time.  She first noticed some swelling.  When it is painful, she has some pain over the medial aspect of the right knee, and occasionally in the lateral aspect of the right knee.  Pain only gets as severe as 4/10.  She has been wearing a brace, and a compression sleeve at night.  She was evaluated at an urgent care in [place], where 75 cc of joint fluid was aspirated, and a right knee injection was completed.  Since then, she did well.  She had no swelling in the knee for at least 6 weeks.  She does not take medicines for her knee on a consistent basis.  She has not worked with physical therapy.  She states that she wanted to make sure that she had somewhere to go on a consistent basis, so she scheduled an appointment.   Review of Systems: No fevers or chills No numbness or tingling No chest pain No shortness of breath No bowel or  bladder dysfunction No GI distress No headaches   Medical History:  Past Medical History:  Diagnosis Date   HLD (hyperlipidemia)    Hypertension     Past Surgical History:  Procedure Laterality Date   CESAREAN SECTION     1993   CHOLECYSTECTOMY     1996   COLONOSCOPY WITH PROPOFOL N/A 11/18/2021   Procedure: COLONOSCOPY WITH PROPOFOL;  Surgeon: Toney Reil, MD;  Location: [facility] ENDOSCOPY;  Service: Gastroenterology;  Laterality: N/A;   TUBAL LIGATION     1995    Family History  Problem Relation Age of Onset   Alcohol abuse Mother    Heart attack Mother 69   Skin cancer Father    Alcohol abuse Father    Prostate cancer Father    Prostate cancer Paternal Grandfather    Skin cancer Paternal Grandfather    Asthma Daughter    Mental illness Daughter        borderline   Social History   Tobacco Use   Smoking status: Every Day    Average packs/day: 1.7 packs/day for 57.0 years (95.5 ttl pk-yrs)    Types: Cigarettes    Start date: 2004   Smokeless tobacco: Never   Tobacco comments:    Pt smokes two packs cigarettes for  the past 20 yrs, wants 14 mg patch  Vaping Use   Vaping status: Never Used  Substance Use Topics   Alcohol use: Not Currently    Comment: h/o alcoholism quit 2017   Drug use: Yes    Types: Marijuana    Comment: edibles last time 11/16/2021    Allergies  Allergen Reactions   Buspar [Buspirone] Other (See Comments)    Optical migraines   Lisinopril Cough   Morphine Itching and Other (See Comments)    "tried to take her face off due to itching"    Sulfa Antibiotics Other (See Comments)    Passes out      Current Meds  Medication Sig   albuterol (VENTOLIN HFA) 108 (90 Base) MCG/ACT inhaler Inhale 2 puffs into the lungs every 4 (four) hours as needed for wheezing or shortness of breath.   atorvastatin (LIPITOR) 40 MG tablet Take 1 tablet (40 mg total) by mouth daily.   divalproex (DEPAKOTE ER) 500 MG 24 hr tablet Take 1 tablet (500 mg  total) by mouth at bedtime.   DULoxetine 40 MG CPEP Take 1 capsule (40 mg total) by mouth daily.   hydrochlorothiazide (HYDRODIURIL) 25 MG tablet Take 1 tablet (25 mg total) by mouth daily.   hydrOXYzine (ATARAX) 25 MG tablet Take 1 tablet (25 mg total) by mouth 3 (three) times daily as needed for anxiety.   irbesartan (AVAPRO) 300 MG tablet Take 1 tablet (300 mg total) by mouth daily.   metoprolol succinate (TOPROL-XL) 25 MG 24 hr tablet Take 3 tablets (75 mg total) by mouth daily.   nicotine (NICODERM CQ - DOSED IN MG/24 HOURS) 14 mg/24hr patch Place 1 patch (14 mg total) onto the skin daily.   traZODone (DESYREL) 50 MG tablet Take 1 tablet (50 mg total) by mouth at bedtime as needed for sleep.    Objective: LMP 10/04/2022 (Approximate) Comment: has period off/on  Physical Exam:  General: Alert and oriented. and No acute distress. Gait: Normal gait.  Evaluation the right knee demonstrates a mild effusion.  No point tenderness.  She has excellent range of motion.  Negative Lachman.  No increased laxity in varus or valgus stress.  IMAGING: I personally reviewed images previously obtained in clinic          New Medications:  No orders of the defined types were placed in this encounter.     Oliver Barre, MD  12/29/2022 9:44 AM

## 2022-12-30 ENCOUNTER — Encounter: Payer: Self-pay | Admitting: *Deleted

## 2022-12-30 ENCOUNTER — Encounter: Payer: Self-pay | Admitting: Family

## 2022-12-30 ENCOUNTER — Ambulatory Visit: Payer: 59 | Admitting: Family

## 2022-12-30 VITALS — BP 112/76 | HR 54 | Temp 98.0°F | Ht 67.0 in | Wt 200.0 lb

## 2022-12-30 DIAGNOSIS — Z5181 Encounter for therapeutic drug level monitoring: Secondary | ICD-10-CM | POA: Diagnosis not present

## 2022-12-30 DIAGNOSIS — R45851 Suicidal ideations: Secondary | ICD-10-CM | POA: Diagnosis not present

## 2022-12-30 DIAGNOSIS — G479 Sleep disorder, unspecified: Secondary | ICD-10-CM

## 2022-12-30 DIAGNOSIS — F411 Generalized anxiety disorder: Secondary | ICD-10-CM

## 2022-12-30 DIAGNOSIS — R768 Other specified abnormal immunological findings in serum: Secondary | ICD-10-CM

## 2022-12-30 DIAGNOSIS — D582 Other hemoglobinopathies: Secondary | ICD-10-CM

## 2022-12-30 DIAGNOSIS — F428 Other obsessive-compulsive disorder: Secondary | ICD-10-CM | POA: Diagnosis not present

## 2022-12-30 DIAGNOSIS — F332 Major depressive disorder, recurrent severe without psychotic features: Secondary | ICD-10-CM

## 2022-12-30 DIAGNOSIS — M2550 Pain in unspecified joint: Secondary | ICD-10-CM

## 2022-12-30 DIAGNOSIS — R7689 Other specified abnormal immunological findings in serum: Secondary | ICD-10-CM

## 2022-12-30 MED ORDER — HYDROXYZINE HCL 25 MG PO TABS
25.0000 mg | ORAL_TABLET | Freq: Three times a day (TID) | ORAL | 0 refills | Status: AC | PRN
Start: 2022-12-30 — End: 2023-01-29

## 2022-12-30 MED ORDER — TRAZODONE HCL 50 MG PO TABS
50.0000 mg | ORAL_TABLET | Freq: Every evening | ORAL | 3 refills | Status: DC | PRN
Start: 2022-12-30 — End: 2023-01-28

## 2022-12-30 NOTE — Patient Instructions (Signed)
  A referral was placed today for rheumatology and also psychiatry and psychology. Please let us know if you have not heard back within 2 weeks about the referral.   Regards,   Jannifer Fischler FNP-C

## 2022-12-30 NOTE — Assessment & Plan Note (Addendum)
Ordering depakote level today pending results.  Ordering liver function and cbc as well for monitoring as initiating.  Psychiatry will take over management once established.  Advised pt to stop magnesium otc for now as can increase cns depression

## 2022-12-30 NOTE — Assessment & Plan Note (Addendum)
Ongoing although slightly improved.  Referral placed for psychiatry pt to consult and f/u care with them once established. For now will follow medication management.  Possible mania type symptoms per Er/urgent care note and inpatient BH.  Referral placed for therapy specifically in EMDR, Brain spotting, DBT, CBT Cont f/u with Sandra Montes therapy as scheduled.  Given SI/HI precautions.  FMLA start 8/14 projected end 10/1  Will re evaluate at two week f/u appt.

## 2022-12-30 NOTE — Assessment & Plan Note (Signed)
Refilled trazodone.

## 2022-12-30 NOTE — Progress Notes (Signed)
Established Patient Office Visit  Subjective:      CC:  Chief Complaint  Patient presents with   Follow-up    FMLA paperwork    HPI: Sandra Montes is a 52 y.o. female presenting on 12/30/2022 for Follow-up (FMLA paperwork) . Initially went to Er 8/14 prior to voluntary admission. Recently inpatient 12/16/22 for voluntary admission to inpatient psychiatric unit. Admitted 8/15 discharge 12/22/22. Diagnosed with MDD recurrent episode, severe. She went to ER because of worsening depression with SI and suicidal statesments. Overwhelmed with life stressors. She had had some ongoing intrusive thoughts of hurting herself. 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 Crying spells anger outbursts anxiety decreased sleep decreased appetite self isolation. Speech was rapid in the Er. Some mania symptoms were observed inpatient. UDS was positive for marijuana, not using anymore.   Sees Doree Barthel psychologist.    New complaints: Today she states she is feeling a lot better but she does identify that she needs behavioral cbt to work through some stressors as her old coping mecanisms such as CBD, drinking she is no longer using. No longerw ith intrusive thoughts of SI at current.   Currently started and taking  Depakote 500 mg daily  Hydroxyxine 25 mg which she states she is using twice daily.  Duloxetine 40 mg CPEP  She would like refill on her trazodone 50 mg , not sleeping well with awakenings, but sleeping better than she was prior to admission.   Depakote level <10 12/18/22  Social history:  Relevant past medical, surgical, family and social history reviewed and updated as indicated. Interim medical history since our last visit reviewed.  Allergies and medications reviewed and updated.  DATA REVIEWED: CHART IN EPIC     ROS: Negative unless specifically indicated above in HPI.    Current Outpatient Medications:    albuterol (VENTOLIN HFA)  108 (90 Base) MCG/ACT inhaler, Inhale 2 puffs into the lungs every 4 (four) hours as needed for wheezing or shortness of breath., Disp: 18 g, Rfl: 0   atorvastatin (LIPITOR) 40 MG tablet, Take 1 tablet (40 mg total) by mouth daily., Disp: 30 tablet, Rfl: 0   b complex vitamins capsule, Take 1 capsule by mouth daily., Disp: , Rfl:    divalproex (DEPAKOTE ER) 500 MG 24 hr tablet, Take 1 tablet (500 mg total) by mouth at bedtime., Disp: 30 tablet, Rfl: 0   DULoxetine 40 MG CPEP, Take 1 capsule (40 mg total) by mouth daily., Disp: 30 capsule, Rfl: 3   fish oil-omega-3 fatty acids 1000 MG capsule, Take 2 g by mouth daily., Disp: , Rfl:    hydrochlorothiazide (HYDRODIURIL) 25 MG tablet, Take 1 tablet (25 mg total) by mouth daily., Disp: 30 tablet, Rfl: 0   irbesartan (AVAPRO) 300 MG tablet, Take 1 tablet (300 mg total) by mouth daily., Disp: 30 tablet, Rfl: 0   metoprolol succinate (TOPROL-XL) 25 MG 24 hr tablet, Take 3 tablets (75 mg total) by mouth daily., Disp: 30 tablet, Rfl: 0   nicotine (NICODERM CQ - DOSED IN MG/24 HOURS) 14 mg/24hr patch, Place 1 patch (14 mg total) onto the skin daily., Disp: 28 patch, Rfl: 0   hydrOXYzine (ATARAX) 25 MG tablet, Take 1 tablet (25 mg total) by mouth 3 (three) times daily as needed for anxiety., Disp: 90 tablet, Rfl: 0   traZODone (DESYREL) 50 MG tablet, Take 1 tablet (50 mg total) by mouth at bedtime as needed for sleep., Disp: 90 tablet,  Rfl: 3      Objective:    BP 112/76 (BP Location: Right Arm, Patient Position: Sitting, Cuff Size: Normal)   Pulse (!) 54   Temp 98 F (36.7 C) (Oral)   Ht 5\' 7"  (1.702 m)   Wt 200 lb (90.7 kg)   LMP 10/04/2022 (Approximate) Comment: has period off/on  SpO2 97%   BMI 31.32 kg/m   Wt Readings from Last 3 Encounters:  12/30/22 200 lb (90.7 kg)  12/17/22 201 lb (91.2 kg)  10/20/22 208 lb (94.3 kg)    Physical Exam Constitutional:      General: She is not in acute distress.    Appearance: Normal appearance. She is  obese. She is not ill-appearing, toxic-appearing or diaphoretic.  HENT:     Head: Normocephalic.  Cardiovascular:     Rate and Rhythm: Normal rate.  Pulmonary:     Effort: Pulmonary effort is normal.  Musculoskeletal:        General: Normal range of motion.  Neurological:     General: No focal deficit present.     Mental Status: She is alert and oriented to person, place, and time. Mental status is at baseline.  Psychiatric:        Mood and Affect: Mood normal.        Behavior: Behavior normal.        Thought Content: Thought content normal.        Judgment: Judgment normal.           Assessment & Plan:  Severe episode of recurrent major depressive disorder, without psychotic features (HCC) Assessment & Plan: Ongoing although slightly improved.  Referral placed for psychiatry pt to consult and f/u care with them once established. For now will follow medication management.  Possible mania type symptoms per Er/urgent care note and inpatient BH.  Referral placed for therapy specifically in EMDR, Brain spotting, DBT, CBT Cont f/u with Doree Barthel therapy as scheduled.  Given SI/HI precautions.  FMLA start 8/14 projected end 10/1  Will re evaluate at two week f/u appt.    Orders: -     Ambulatory referral to Psychiatry -     Ambulatory referral to Psychology  Obsessional thoughts -     Ambulatory referral to Psychiatry -     Ambulatory referral to Psychology  Suicidal ideation -     Ambulatory referral to Psychiatry -     Ambulatory referral to Psychology  Therapeutic drug monitoring Assessment & Plan: Ordering depakote level today pending results.  Ordering liver function and cbc as well for monitoring as initiating.  Psychiatry will take over management once established.  Advised pt to stop magnesium otc for now as can increase cns depression   Orders: -     Valproic acid level; Future -     CBC with Differential/Platelet; Future -     Comprehensive metabolic  panel; Future  Sleep disorder Assessment & Plan: Refilled trazodone.   Orders: -     traZODone HCl; Take 1 tablet (50 mg total) by mouth at bedtime as needed for sleep.  Dispense: 90 tablet; Refill: 3  Positive ANA (antinuclear antibody) -     Ambulatory referral to Rheumatology  Polyarthralgia -     Ambulatory referral to Rheumatology  Elevated hemoglobin (HCC) -     CBC with Differential/Platelet; Future  Anxiety state -     hydrOXYzine HCl; Take 1 tablet (25 mg total) by mouth 3 (three) times daily as needed for anxiety.  Dispense: 90 tablet; Refill: 0     Return in about 2 weeks (around 01/13/2023) for f/u can be video visit or can be in person .  Mort Sawyers, MSN, APRN, FNP-C [facility]

## 2022-12-31 ENCOUNTER — Ambulatory Visit: Payer: 59 | Admitting: Clinical

## 2022-12-31 DIAGNOSIS — F439 Reaction to severe stress, unspecified: Secondary | ICD-10-CM

## 2022-12-31 DIAGNOSIS — F332 Major depressive disorder, recurrent severe without psychotic features: Secondary | ICD-10-CM

## 2022-12-31 DIAGNOSIS — F411 Generalized anxiety disorder: Secondary | ICD-10-CM

## 2022-12-31 NOTE — Progress Notes (Signed)
[facility] Counselor/Therapist Progress Note  Patient ID: Sandra Montes, MRN: [id number],    Date: 12/31/2022  Time Spent: 8:38am - 9:36am : 58 minutes   Treatment Type: Individual Therapy  Reported Symptoms: Patient reported she has been experiencing flashbacks  Mental Status Exam: Appearance:  Neat and Well Groomed     Behavior: Appropriate  Motor: Normal  Speech/Language:  Clear and Coherent  Affect: Appropriate  Mood: normal  Thought process: normal  Thought content:   WNL  Sensory/Perceptual disturbances:   WNL  Orientation: oriented to person, place, and situation  Attention: Good  Concentration: Good  Memory: WNL  Fund of knowledge:  Good  Insight:   Good  Judgment:  Good  Impulse Control: Good   Risk Assessment: Danger to Self:  No Patient denied current suicidal ideation  Self-injurious Behavior: No Danger to Others: No Patient denied current homicidal ideation Duty to Warn:no Physical Aggression / Violence:No  Access to Firearms a concern: No  Gang Involvement:No   Subjective: Patient stated, "it's been a whirlwind of a week". Patient reported she has obtained a provider that specializes in treatment of trauma, EMDR, and brain spotting and is scheduled to see the provider, AJ Tschupp at [facility] on Monday, September 2nd. Patient reported she is currently trying to limit her interactions with her mother. Patient reported she has a meeting today with a financial wellness planner through patient's place of employment. Patient stated, "Monday was a very challenging day". Patient reported on Monday patient's laptop was not working properly, patient received notification that her homeowners insurance premium increased by one thousand dollars, and she was experiencing issues with her vehicle. Patient reported a recent appointment with her PCP and reported her PCP completed FMLA paperwork for patient. Patient reported during patient's recent  appointment, patient's PCP made a referral to [facility] for medication management. Patient reported she is trying not to make any major decisions for 6-12 months. Patient reported she recently met with her previous significant other for dinner and reported dinner went well. Patient stated, "it was really hard for me to get my motorcycle stuff from him" and stated, "because that means the one thing I really enjoy is gone".  Patient reported she has been experiencing flashbacks. Patient reported concerns regarding finances and reported she plans to ask her father for a loan. Patient reported she has been reading and reported reading is an activity patient previously enjoyed. Patient stated,  "I'm good" in response to patient's current mood.  Interventions: Cognitive Behavioral Therapy. Clinician conducted session in person at clinician's office at [facility]. Discussed patient's transition to another provider for therapy to address patient's clinical needs related to trauma and additional supports patient has obtained to address patient's needs. Reviewed events since last session. Provided supportive therapy, active listening, and validation as patient discussed a recent meeting with patient's previous significant other and patient's response. Discuss recent stressors. Provided psycho education related to trauma and triggers. Discussed strategies patient has implemented to support patient's mental health. Discussed consents for AJ Tschupp at [facility], [facility], and patient's PCP.    Collaboration of Care: Discussed consents for AJ Tschupp at [facility], [facility], and patient's PCP.      Diagnosis:  Severe episode of recurrent major depressive disorder, without psychotic features (HCC)   Unspecified Trauma and Stressor Related Disorder   Generalized anxiety disorder     Plan: Patient  will be transitioning to  AJ Tschupp at [facility] for individual therapy and has an appointment with the provider on Monday, January 04, 2023.       Doree Barthel, LCSW

## 2022-12-31 NOTE — Progress Notes (Signed)
                Karen Sharpe, LCSW 

## 2023-01-01 ENCOUNTER — Other Ambulatory Visit (INDEPENDENT_AMBULATORY_CARE_PROVIDER_SITE_OTHER): Payer: 59

## 2023-01-01 ENCOUNTER — Ambulatory Visit: Payer: Self-pay | Admitting: Orthopedic Surgery

## 2023-01-01 DIAGNOSIS — D582 Other hemoglobinopathies: Secondary | ICD-10-CM

## 2023-01-01 DIAGNOSIS — Z5181 Encounter for therapeutic drug level monitoring: Secondary | ICD-10-CM | POA: Diagnosis not present

## 2023-01-01 NOTE — Addendum Note (Signed)
Addended by: Lovena Neighbours on: 01/01/2023 01:55 PM   Modules accepted: Orders

## 2023-01-02 LAB — CBC WITH DIFFERENTIAL/PLATELET
Absolute Monocytes: 747 {cells}/uL (ref 200–950)
Basophils Absolute: 46 {cells}/uL (ref 0–200)
Basophils Relative: 0.6 %
Eosinophils Absolute: 200 {cells}/uL (ref 15–500)
Eosinophils Relative: 2.6 %
HCT: 42.6 % (ref 35.0–45.0)
Hemoglobin: 14.8 g/dL (ref 11.7–15.5)
Lymphs Abs: 2549 {cells}/uL (ref 850–3900)
MCH: 31.2 pg (ref 27.0–33.0)
MCHC: 34.7 g/dL (ref 32.0–36.0)
MCV: 89.7 fL (ref 80.0–100.0)
MPV: 9.1 fL (ref 7.5–12.5)
Monocytes Relative: 9.7 %
Neutro Abs: 4158 {cells}/uL (ref 1500–7800)
Neutrophils Relative %: 54 %
Platelets: 185 10*3/uL (ref 140–400)
RBC: 4.75 10*6/uL (ref 3.80–5.10)
RDW: 12.3 % (ref 11.0–15.0)
Total Lymphocyte: 33.1 %
WBC: 7.7 10*3/uL (ref 3.8–10.8)

## 2023-01-02 LAB — COMPREHENSIVE METABOLIC PANEL WITH GFR
AG Ratio: 1.6 (calc) (ref 1.0–2.5)
ALT: 14 U/L (ref 6–29)
AST: 14 U/L (ref 10–35)
Albumin: 3.9 g/dL (ref 3.6–5.1)
Alkaline phosphatase (APISO): 45 U/L (ref 37–153)
BUN: 11 mg/dL (ref 7–25)
CO2: 28 mmol/L (ref 20–32)
Calcium: 9 mg/dL (ref 8.6–10.4)
Chloride: 100 mmol/L (ref 98–110)
Creat: 0.69 mg/dL (ref 0.50–1.03)
Globulin: 2.4 g/dL (ref 1.9–3.7)
Glucose, Bld: 107 mg/dL — ABNORMAL HIGH (ref 65–99)
Potassium: 3.4 mmol/L — ABNORMAL LOW (ref 3.5–5.3)
Sodium: 141 mmol/L (ref 135–146)
Total Bilirubin: 0.7 mg/dL (ref 0.2–1.2)
Total Protein: 6.3 g/dL (ref 6.1–8.1)

## 2023-01-02 LAB — VALPROIC ACID LEVEL: Valproic Acid Lvl: 64.6 mg/L (ref 50.0–100.0)

## 2023-01-05 ENCOUNTER — Encounter: Payer: Self-pay | Admitting: Family

## 2023-01-06 DIAGNOSIS — Z0279 Encounter for issue of other medical certificate: Secondary | ICD-10-CM

## 2023-01-07 ENCOUNTER — Ambulatory Visit: Payer: 59 | Admitting: Clinical

## 2023-01-14 ENCOUNTER — Ambulatory Visit: Payer: 59 | Admitting: Family

## 2023-01-14 ENCOUNTER — Encounter: Payer: Self-pay | Admitting: Family

## 2023-01-14 VITALS — BP 110/80 | HR 55 | Temp 97.3°F | Ht 67.0 in | Wt 200.0 lb

## 2023-01-14 DIAGNOSIS — E785 Hyperlipidemia, unspecified: Secondary | ICD-10-CM | POA: Diagnosis not present

## 2023-01-14 DIAGNOSIS — Z720 Tobacco use: Secondary | ICD-10-CM

## 2023-01-14 DIAGNOSIS — I10 Essential (primary) hypertension: Secondary | ICD-10-CM

## 2023-01-14 DIAGNOSIS — F332 Major depressive disorder, recurrent severe without psychotic features: Secondary | ICD-10-CM | POA: Diagnosis not present

## 2023-01-14 DIAGNOSIS — F17210 Nicotine dependence, cigarettes, uncomplicated: Secondary | ICD-10-CM

## 2023-01-14 MED ORDER — IRBESARTAN 300 MG PO TABS: 300.0000 mg | ORAL_TABLET | Freq: Every day | ORAL | 0 refills | Status: DC

## 2023-01-14 MED ORDER — ATORVASTATIN CALCIUM 40 MG PO TABS: 40.0000 mg | ORAL_TABLET | Freq: Every day | ORAL | 3 refills | Status: DC

## 2023-01-14 MED ORDER — METOPROLOL SUCCINATE ER 50 MG PO TB24
50.0000 mg | ORAL_TABLET | Freq: Every day | ORAL | 3 refills | Status: DC
Start: 2023-01-14 — End: 2024-02-03

## 2023-01-14 MED ORDER — DIVALPROEX SODIUM ER 500 MG PO TB24: 500.0000 mg | ORAL_TABLET | Freq: Every day | ORAL | 0 refills | Status: DC

## 2023-01-14 MED ORDER — DULOXETINE HCL 40 MG PO CPEP: 40.0000 mg | ORAL_CAPSULE | Freq: Every day | ORAL | 0 refills | Status: DC

## 2023-01-14 MED ORDER — NICOTINE 7 MG/24HR TD PT24: 7.0000 mg | MEDICATED_PATCH | Freq: Every day | TRANSDERMAL | 0 refills | Status: DC

## 2023-01-14 NOTE — Assessment & Plan Note (Signed)
Decrease nicotine to

## 2023-01-14 NOTE — Progress Notes (Signed)
Established Patient Office Visit  Subjective:      CC:  Chief Complaint  Patient presents with   Follow-up    HPI: Sandra Montes is a 52 y.o. female presenting on 01/14/2023 for Follow-up . 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 Still on depakote 500 mg once daily, hydroxyxine 25 mg twice daily and duloxetine 40 mg CPEP. Trazodone 50 mg once daily as well. Has already established with a psychologist who specialized in trauma care with  Dr. Levora Dredge, has a follow up coming up.   FMLA 8/14 to 10/1 so far. Will reevaluate at follow up  Does not have scheduled appt with a psychiatrist as of yet, pt states she was never called.   Smoking cessation: has no cravings and would like to decrease her nicotine dosing. She would like to decrease dose.   Urinary incontinence worse with the hydrochlorothiazide would like to try to come off of this.   Lower blood pressure she is noting as of lately since quitting alcohol and also marijuana. Average around 110/80 heart rate 40-50 per her record. Metoprolol 75 mg once daily.   Last metabolic panel Lab Results  Component Value Date   GLUCOSE 107 (H) 01/01/2023   NA 141 01/01/2023   K 3.4 (L) 01/01/2023   CL 100 01/01/2023   CO2 28 01/01/2023   BUN 11 01/01/2023   CREATININE 0.69 01/01/2023   GFRNONAA >60 12/20/2022   CALCIUM 9.0 01/01/2023   PROT 6.3 01/01/2023   ALBUMIN 4.3 12/16/2022   BILITOT 0.7 01/01/2023   ALKPHOS 50 12/16/2022   AST 14 01/01/2023   ALT 14 01/01/2023   ANIONGAP 10 12/20/2022    Lab Results  Component Value Date   VALPROATE 64.6 01/01/2023   Polyarthralgia, referral last visit called to rheumatology       Social history:  Relevant past medical, surgical, family and social history reviewed and updated as indicated. Interim medical history since our last visit reviewed.  Allergies and medications reviewed and updated.  DATA REVIEWED:  CHART IN EPIC     ROS: Negative unless specifically indicated above in HPI.    Current Outpatient Medications:    albuterol (VENTOLIN HFA) 108 (90 Base) MCG/ACT inhaler, Inhale 2 puffs into the lungs every 4 (four) hours as needed for wheezing or shortness of breath., Disp: 18 g, Rfl: 0   b complex vitamins capsule, Take 1 capsule by mouth daily., Disp: , Rfl:    fish oil-omega-3 fatty acids 1000 MG capsule, Take 2 g by mouth daily., Disp: , Rfl:    hydrOXYzine (ATARAX) 25 MG tablet, Take 1 tablet (25 mg total) by mouth 3 (three) times daily as needed for anxiety., Disp: 90 tablet, Rfl: 0   metoprolol succinate (TOPROL-XL) 50 MG 24 hr tablet, Take 1 tablet (50 mg total) by mouth daily. Take with or immediately following a meal., Disp: 90 tablet, Rfl: 3   nicotine (NICODERM CQ - DOSED IN MG/24 HR) 7 mg/24hr patch, Place 1 patch (7 mg total) onto the skin daily., Disp: 28 patch, Rfl: 0   traZODone (DESYREL) 50 MG tablet, Take 1 tablet (50 mg total) by mouth at bedtime as needed for sleep., Disp: 90 tablet, Rfl: 3   atorvastatin (LIPITOR) 40 MG tablet, Take 1 tablet (40 mg total) by mouth daily., Disp: 90 tablet, Rfl: 3   divalproex (DEPAKOTE ER) 500 MG 24 hr tablet, Take 1 tablet (500 mg total) by  mouth at bedtime., Disp: 90 tablet, Rfl: 0   DULoxetine HCl 40 MG CPEP, Take 1 capsule (40 mg total) by mouth daily., Disp: 90 capsule, Rfl: 0   irbesartan (AVAPRO) 300 MG tablet, Take 1 tablet (300 mg total) by mouth daily., Disp: 30 tablet, Rfl: 0      Objective:    BP 110/80 (BP Location: Right Arm, Patient Position: Sitting, Cuff Size: Normal)   Pulse (!) 55   Temp (!) 97.3 F (36.3 C) (Temporal)   Ht 5\' 7"  (1.702 m)   Wt 200 lb (90.7 kg)   LMP 10/04/2022 (Approximate) Comment: has period off/on  SpO2 97%   BMI 31.32 kg/m   Wt Readings from Last 3 Encounters:  01/14/23 200 lb (90.7 kg)  12/30/22 200 lb (90.7 kg)  10/20/22 208 lb (94.3 kg)    Physical Exam Constitutional:       General: She is not in acute distress.    Appearance: Normal appearance. She is normal weight. She is not ill-appearing, toxic-appearing or diaphoretic.  HENT:     Head: Normocephalic.  Cardiovascular:     Rate and Rhythm: Normal rate and regular rhythm.  Pulmonary:     Effort: Pulmonary effort is normal.  Musculoskeletal:        General: Normal range of motion.  Neurological:     General: No focal deficit present.     Mental Status: She is alert and oriented to person, place, and time. Mental status is at baseline.  Psychiatric:        Mood and Affect: Mood normal.        Behavior: Behavior normal.        Thought Content: Thought content normal.        Judgment: Judgment normal.           Assessment & Plan:  Tobacco abuse Assessment & Plan: Decrease nicotine dose to 7 mg patch Smoking cessation instruction/counseling given:  counseled patient on the dangers of tobacco use, advised patient to stop smoking, and reviewed strategies to maximize success   Orders: -     Nicotine; Place 1 patch (7 mg total) onto the skin daily.  Dispense: 28 patch; Refill: 0  Essential hypertension -     Metoprolol Succinate ER; Take 1 tablet (50 mg total) by mouth daily. Take with or immediately following a meal.  Dispense: 90 tablet; Refill: 3 -     Irbesartan; Take 1 tablet (300 mg total) by mouth daily.  Dispense: 30 tablet; Refill: 0  Dyslipidemia -     Atorvastatin Calcium; Take 1 tablet (40 mg total) by mouth daily.  Dispense: 90 tablet; Refill: 3  Severe episode of recurrent major depressive disorder, without psychotic features [facility]) Assessment & Plan: Improving Referral following up with, no word yet. Reached out to referral team and also advised pt to call insurance to ask for covered psychiatry. She verbalized understanding.  Continue medications as prescribed.  Goal still for 10/1 will f/u prior to discuss with pt return to work.   Orders: -     Divalproex Sodium ER; Take 1  tablet (500 mg total) by mouth at bedtime.  Dispense: 90 tablet; Refill: 0  Cigarette smoker motivated to quit Assessment & Plan: Decrease nicotine to    Other orders -     DULoxetine HCl; Take 1 capsule (40 mg total) by mouth daily.  Dispense: 90 capsule; Refill: 0     Return in about 2 weeks (around 01/28/2023) for f/u anxiety,  f/u depression.  Mort Sawyers, MSN, APRN, FNP-C [facility]

## 2023-01-14 NOTE — Patient Instructions (Signed)
  Dr. Elna Breslow could look into her if they take your insurance?  Dr. Evelene Croon psychiatry .

## 2023-01-14 NOTE — Assessment & Plan Note (Signed)
Decrease nicotine dose to 7 mg patch Smoking cessation instruction/counseling given:  counseled patient on the dangers of tobacco use, advised patient to stop smoking, and reviewed strategies to maximize success

## 2023-01-15 ENCOUNTER — Telehealth: Payer: Self-pay | Admitting: Family

## 2023-01-15 NOTE — Telephone Encounter (Signed)
Patient dropped off document  Disability Form , to be filled out by provider. Patient requested to send it back via Call Patient to pick up within ASAP. Document is located in providers tray at front office.Please advise at Mobile [phone] (mobile). Patient has requested that her mother be contacted to pick this document up (Ok per [facility]) Please call Pt's mother to pick up.

## 2023-01-15 NOTE — Assessment & Plan Note (Signed)
Improving Referral following up with, no word yet. Reached out to referral team and also advised pt to call insurance to ask for covered psychiatry. She verbalized understanding.  Continue medications as prescribed.  Goal still for 10/1 will f/u prior to discuss with pt return to work.

## 2023-01-15 NOTE — Telephone Encounter (Signed)
Forms have been placed in Sandra Montes's inbox.

## 2023-01-18 ENCOUNTER — Encounter: Payer: Self-pay | Admitting: Family

## 2023-01-18 NOTE — Telephone Encounter (Signed)
I responded to her via my-chart

## 2023-01-19 DIAGNOSIS — Z0279 Encounter for issue of other medical certificate: Secondary | ICD-10-CM

## 2023-01-19 NOTE — Telephone Encounter (Signed)
Completed form.  Left in outbox. Can we copy and then fax, and leave at front desk once done for pt to pick up. I sent her a mychart message already letting her know she could pick it up .

## 2023-01-19 NOTE — Telephone Encounter (Signed)
These are completed. This is a duplicate open message

## 2023-01-21 ENCOUNTER — Telehealth: Payer: Self-pay | Admitting: Family

## 2023-01-21 NOTE — Telephone Encounter (Signed)
Received a fax from [employer]. These forms are for the pt's short term disability claim. Forms have been placed in Tabitha's inbox.  Spoke with pt. She is aware that we have received these forms and that they will be given to [facility] for completion. She would like for them to be faxed once they are done and she will pick up the originals.

## 2023-01-28 ENCOUNTER — Ambulatory Visit: Payer: 59 | Admitting: Family

## 2023-01-28 ENCOUNTER — Telehealth: Payer: Self-pay | Admitting: Family

## 2023-01-28 ENCOUNTER — Encounter: Payer: Self-pay | Admitting: Family

## 2023-01-28 VITALS — BP 144/92 | HR 57 | Temp 98.0°F | Ht 67.0 in | Wt 203.6 lb

## 2023-01-28 DIAGNOSIS — G479 Sleep disorder, unspecified: Secondary | ICD-10-CM | POA: Diagnosis not present

## 2023-01-28 DIAGNOSIS — F411 Generalized anxiety disorder: Secondary | ICD-10-CM

## 2023-01-28 DIAGNOSIS — F428 Other obsessive-compulsive disorder: Secondary | ICD-10-CM

## 2023-01-28 DIAGNOSIS — R460 Very low level of personal hygiene: Secondary | ICD-10-CM | POA: Insufficient documentation

## 2023-01-28 DIAGNOSIS — Z8659 Personal history of other mental and behavioral disorders: Secondary | ICD-10-CM

## 2023-01-28 DIAGNOSIS — F332 Major depressive disorder, recurrent severe without psychotic features: Secondary | ICD-10-CM

## 2023-01-28 MED ORDER — TRAZODONE HCL 50 MG PO TABS
50.0000 mg | ORAL_TABLET | Freq: Every evening | ORAL | 0 refills | Status: DC | PRN
Start: 2023-01-28 — End: 2023-02-11

## 2023-01-28 NOTE — Progress Notes (Signed)
Established Care   Subjective:      CC:  Chief Complaint  Patient presents with   Medical Management of Chronic Issues    HPI: Sandra Montes is a 52 y.o. female presenting on 01/28/2023 for Medical Management of Chronic Issues . MDD: Still on depakote 500 mg once daily, hydroxyxine 25 mg twice daily and duloxetine 40 mg CPEP. Trazodone 50 mg once daily as well however has been out as she left out of state on accident. Has already established with a psychologist who specialized in trauma care with Dr. Levora Dredge, has a follow up coming up.   FMLA 8/14 to 10/1 so far. Was not approved for STD because it was not 'clear enough' in the documentation what restrictions were at home and also what job duties she was not able to complete. She does report that she is not grooming or exhibiting appropriate hygiene measures at home, not bathing regularly it has been 10 days since she last washed her hair. She can not focus and is emotionally labile during work, periods of crying which does not allow her to work in a team lead role and or focus or concentrate on any document audits. Can not concentrate at home on TV either.   She is not yet ready to go back 10/1 to work, she is much too stressed and anxious about her current STD issue with the paperwork. It is very stressful as she is also having to figure out to handle her financial situation. She would like to try to go back for 10/21.   Does not have scheduled appt with a psychiatrist as of yet, pt states she was never called even since last visit. Will look into this.     Social history:  Relevant past medical, surgical, family and social history reviewed and updated as indicated. Interim medical history since our last visit reviewed.  Allergies and medications reviewed and updated.  DATA REVIEWED: CHART IN EPIC     ROS: Negative unless specifically indicated above in HPI.    Current Outpatient Medications:    albuterol  (VENTOLIN HFA) 108 (90 Base) MCG/ACT inhaler, Inhale 2 puffs into the lungs every 4 (four) hours as needed for wheezing or shortness of breath., Disp: 18 g, Rfl: 0   atorvastatin (LIPITOR) 40 MG tablet, Take 1 tablet (40 mg total) by mouth daily., Disp: 90 tablet, Rfl: 3   b complex vitamins capsule, Take 1 capsule by mouth daily., Disp: , Rfl:    divalproex (DEPAKOTE ER) 500 MG 24 hr tablet, Take 1 tablet (500 mg total) by mouth at bedtime., Disp: 90 tablet, Rfl: 0   DULoxetine HCl 40 MG CPEP, Take 1 capsule (40 mg total) by mouth daily., Disp: 90 capsule, Rfl: 0   fish oil-omega-3 fatty acids 1000 MG capsule, Take 2 g by mouth daily., Disp: , Rfl:    hydrOXYzine (ATARAX) 25 MG tablet, Take 1 tablet (25 mg total) by mouth 3 (three) times daily as needed for anxiety., Disp: 90 tablet, Rfl: 0   irbesartan (AVAPRO) 300 MG tablet, Take 1 tablet (300 mg total) by mouth daily., Disp: 30 tablet, Rfl: 0   metoprolol succinate (TOPROL-XL) 50 MG 24 hr tablet, Take 1 tablet (50 mg total) by mouth daily. Take with or immediately following a meal., Disp: 90 tablet, Rfl: 3   nicotine (NICODERM CQ - DOSED IN MG/24 HR) 7 mg/24hr patch, Place 1 patch (7 mg total) onto the skin daily., Disp: 28  patch, Rfl: 0   traZODone (DESYREL) 50 MG tablet, Take 1 tablet (50 mg total) by mouth at bedtime as needed for sleep., Disp: 30 tablet, Rfl: 0      Objective:    BP (!) 144/92 (BP Location: Left Arm, Patient Position: Sitting, Cuff Size: Normal)   Pulse (!) 57   Temp 98 F (36.7 C) (Temporal)   Ht 5\' 7"  (1.702 m)   Wt 203 lb 9.6 oz (92.4 kg)   SpO2 95%   BMI 31.89 kg/m   Wt Readings from Last 3 Encounters:  01/28/23 203 lb 9.6 oz (92.4 kg)  01/14/23 200 lb (90.7 kg)  12/30/22 200 lb (90.7 kg)    Physical Exam Constitutional:      General: She is not in acute distress.    Appearance: Normal appearance. She is normal weight. She is not ill-appearing, toxic-appearing or diaphoretic.  HENT:     Head:  Normocephalic.  Cardiovascular:     Rate and Rhythm: Normal rate.  Pulmonary:     Effort: Pulmonary effort is normal.  Musculoskeletal:        General: Normal range of motion.  Neurological:     General: No focal deficit present.     Mental Status: She is alert and oriented to person, place, and time. Mental status is at baseline.  Psychiatric:        Attention and Perception: Attention and perception normal.        Mood and Affect: Mood is anxious. Affect is angry and tearful.        Speech: Speech normal.        Behavior: Behavior normal. Behavior is cooperative.        Thought Content: Thought content normal. Thought content does not include homicidal or suicidal ideation. Thought content does not include homicidal or suicidal plan.        Cognition and Memory: Cognition and memory normal.        Judgment: Judgment normal.           Assessment & Plan:  Poor personal hygiene  Sleep disorder Assessment & Plan: Refilled trazodone.    Orders: -     traZODone HCl; Take 1 tablet (50 mg total) by mouth at bedtime as needed for sleep.  Dispense: 30 tablet; Refill: 0  Self-care deficit for bathing Assessment & Plan: Grooming and hygiene concerns, pt at this time is unable to care for herself to a mild degree and at this time is not able to make major decisions for herself as she is in a depressive state interfering with her ability to do this.    Severe episode of recurrent major depressive disorder, without psychotic features (HCC) Assessment & Plan: Pt to continue medications as prescribed and f/u with psychology as scheduled.  Denies SI HI    Obsessional thoughts  History of suicidal ideation  Anxiety state Assessment & Plan: Not improving. Checking into referral to psychiatry and have also advised pt to call insurance to see available psychiatrists to see if she can get in any sooner.  Continue medications as prescribed. We have discussed urgent care walk in  options for crisis. Anxiety interfering with work, unable to delegate, stay on task and or concentrate and also emotional lability is causing her also to not be able to remain focused.       Return in about 2 weeks (around 02/11/2023) for f/u anxiety.  Mort Sawyers, MSN, APRN, FNP-C [facility] Medicine

## 2023-01-28 NOTE — Assessment & Plan Note (Signed)
Refilled trazodone.

## 2023-01-28 NOTE — Telephone Encounter (Signed)
Mort Sawyers, FNP  P Dugal Pool Completed FMLA please print out medication list and also todays note and attach and let pt know this is ready for pick up. She will have to fill out one page with her signature. ------------------------------------------------------- Spoke with pt. Advised her that one of her forms will need her signature before we can fax it. She requests to just pick this form up and she will email it back to her employer. This form along with records needed has been left up front for her to pick up. There was another form that was filled out by [place] that she requested for me to go ahead and fax in for her; this has been taken care of for her. Nothing further was needed at this time.

## 2023-01-28 NOTE — Assessment & Plan Note (Signed)
Pt to continue medications as prescribed and f/u with psychology as scheduled.  Denies SI HI

## 2023-01-28 NOTE — Assessment & Plan Note (Signed)
Not improving. Checking into referral to psychiatry and have also advised pt to call insurance to see available psychiatrists to see if she can get in any sooner.  Continue medications as prescribed. We have discussed urgent care walk in options for crisis. Anxiety interfering with work, unable to delegate, stay on task and or concentrate and also emotional lability is causing her also to not be able to remain focused.

## 2023-01-28 NOTE — Assessment & Plan Note (Signed)
Grooming and hygiene concerns, pt at this time is unable to care for herself to a mild degree and at this time is not able to make major decisions for herself as she is in a depressive state interfering with her ability to do this.

## 2023-02-01 ENCOUNTER — Encounter: Payer: Self-pay | Admitting: Family

## 2023-02-01 ENCOUNTER — Telehealth: Payer: Self-pay

## 2023-02-01 ENCOUNTER — Other Ambulatory Visit (HOSPITAL_COMMUNITY): Payer: Self-pay

## 2023-02-01 DIAGNOSIS — I10 Essential (primary) hypertension: Secondary | ICD-10-CM

## 2023-02-01 MED ORDER — AMLODIPINE BESYLATE 5 MG PO TABS
5.0000 mg | ORAL_TABLET | Freq: Every day | ORAL | 0 refills | Status: DC
Start: 2023-02-01 — End: 2023-02-09

## 2023-02-01 NOTE — Telephone Encounter (Signed)
Pt states that cymbalta was pricey and 'required something from me'. Is there a way to see if prior Berkley Harvey is needed?

## 2023-02-01 NOTE — Telephone Encounter (Signed)
Pharmacy Patient Advocate Encounter   Received notification from Patient Advice Request messages that prior authorization for DULoxetine HCl 40MG  dr capsules is required/requested.   Insurance verification completed.   The patient is insured through [facility] .   Per test claim: PA required; PA submitted to [facility] via CoverMyMeds Key/confirmation #/[id number] Status is pending

## 2023-02-01 NOTE — Addendum Note (Signed)
Addended by: Mort Sawyers on: 02/01/2023 10:53 PM   Modules accepted: Orders

## 2023-02-02 ENCOUNTER — Other Ambulatory Visit (HOSPITAL_COMMUNITY): Payer: Self-pay

## 2023-02-02 NOTE — Telephone Encounter (Signed)
Pharmacy Patient Advocate Encounter  Received notification from [facility] that Prior Authorization for DULoxetine HCl 40MG  dr capsules  has been APPROVED from 02/01/23 to 02/01/24. Ran test claim, Copay is $40.82. This test claim was processed through [facility]- copay amounts may vary at other pharmacies due to pharmacy/plan contracts, or as the patient moves through the different stages of their insurance plan.   PA #/Case ID/Reference #: [id number]

## 2023-02-03 ENCOUNTER — Ambulatory Visit: Payer: 59 | Admitting: Orthopedic Surgery

## 2023-02-09 MED ORDER — HYDROCHLOROTHIAZIDE 25 MG PO TABS
25.0000 mg | ORAL_TABLET | Freq: Every day | ORAL | 3 refills | Status: DC
Start: 2023-02-09 — End: 2024-02-03

## 2023-02-09 MED ORDER — AMLODIPINE BESYLATE 10 MG PO TABS: 10.0000 mg | ORAL_TABLET | Freq: Every day | ORAL | 3 refills | Status: DC

## 2023-02-09 NOTE — Addendum Note (Signed)
Addended by: Mort Sawyers on: 02/09/2023 06:29 AM   Modules accepted: Orders

## 2023-02-11 ENCOUNTER — Encounter: Payer: Self-pay | Admitting: Family

## 2023-02-11 ENCOUNTER — Ambulatory Visit: Payer: 59 | Admitting: Family

## 2023-02-11 VITALS — BP 116/74 | HR 60 | Temp 97.9°F | Ht 67.0 in | Wt 199.6 lb

## 2023-02-11 DIAGNOSIS — F411 Generalized anxiety disorder: Secondary | ICD-10-CM | POA: Diagnosis not present

## 2023-02-11 DIAGNOSIS — G479 Sleep disorder, unspecified: Secondary | ICD-10-CM | POA: Diagnosis not present

## 2023-02-11 DIAGNOSIS — Z860100 Personal history of colon polyps, unspecified: Secondary | ICD-10-CM

## 2023-02-11 DIAGNOSIS — Z87891 Personal history of nicotine dependence: Secondary | ICD-10-CM | POA: Diagnosis not present

## 2023-02-11 DIAGNOSIS — I10 Essential (primary) hypertension: Secondary | ICD-10-CM | POA: Diagnosis not present

## 2023-02-11 MED ORDER — TRAZODONE HCL 50 MG PO TABS: 50.0000 mg | ORAL_TABLET | Freq: Every evening | ORAL | 3 refills | Status: DC | PRN

## 2023-02-11 NOTE — Assessment & Plan Note (Signed)
Continue amlodipine 10 mg once daily Continue HCT 25 mg once daily , will monitor potassium as h/o diuretic induced hypokalemia, repeat at next visit.  Continue metoprolol XL 50 mg every day Advised to bring blood pressure machine and cuff w her next week to calibrate.  Pt advised of the following:  Continue medication as prescribed. Monitor blood pressure periodically and/or when you feel symptomatic. Goal is <130/90 on average. Ensure that you have rested for 30 minutes prior to checking your blood pressure. Record your readings and bring them to your next visit if necessary.work on a low sodium diet.

## 2023-02-11 NOTE — Assessment & Plan Note (Signed)
Slowly improving. Continue f/u with psychology pending appt with psychiatry for consult.  Continue depakote ER 500 mg daily  Continue duloxetine 40 mg once daily  Goal to return to work date of 10/21 however will recommend return to work in remote status from home. Goal is to then return to non remote status 04/04/23. Working from home will decrease mental stimuli and allow pt to focus and get back into work delegation and time management without external stressors from being in a social setting at work with more distraction. This will also provide her with the ability to reach a therapeutic level with medication management and allow collaboration with psychiatry and psychology.

## 2023-02-11 NOTE — Assessment & Plan Note (Signed)
Smoking cessation instruction/counseling given:  commended patient for quitting and reviewed strategies for preventing relapses 

## 2023-02-11 NOTE — Progress Notes (Signed)
Established Patient Office Visit  Subjective:      CC:  Chief Complaint  Patient presents with   Medical Management of Chronic Issues    HPI: Sandra Montes is a 52 y.o. female presenting on 02/11/2023 for Medical Management of Chronic Issues . HTN: 9/30 pt reached out via message that blood pressure average was 150/90 with ongoing headaches. Amlodipine 5 mg was added once daily, pt currently on irbesartan 300 mg as well. Still not controlled, increased amlodipine to 10 mg once daily and added on hydrochlorothiazide 25 mg once daily (we will need to monitor potassium as h/o of decreased K with diuretic therapy)  She does bring blood pressure average log with her, most recent this am 145/85.   She states since adding in the hydrochlorothiazide her headache has decreased and she is 'feeling much better'. Her blood pressure today in office is 116/74. She only just doubled her amlodipine in the last two days.   Has appt with psychiatry for 11/20 for consult.  She is slated to go back   She is curious if she would be able to consider working remotely from home. She would like to return as scheduled for 10/21, just remotely. This would allow her to make appointments with primary as well as psychiatry and therapy, and to allow time for her to work on medication management and get to a therapeutic dose prior to having external stimuli at work that would worsen her focus, and emotional state. She does report with increased stressors still with agitation, increased anxiety, emotional lability at times tearful. She feels working from home would be reasonable as reduced stimuli where she could remain focused and continue with work.   Goal start would be to work remotely from 10/21 to 04/04/23 in which we will meet prior and discuss further at that time.     Social history:  Relevant past medical, surgical, family and social history reviewed and updated as indicated. Interim medical history  since our last visit reviewed.  Allergies and medications reviewed and updated.  DATA REVIEWED: CHART IN EPIC     ROS: Negative unless specifically indicated above in HPI.    Current Outpatient Medications:    albuterol (VENTOLIN HFA) 108 (90 Base) MCG/ACT inhaler, Inhale 2 puffs into the lungs every 4 (four) hours as needed for wheezing or shortness of breath., Disp: 18 g, Rfl: 0   amLODipine (NORVASC) 10 MG tablet, Take 1 tablet (10 mg total) by mouth daily., Disp: 90 tablet, Rfl: 3   atorvastatin (LIPITOR) 40 MG tablet, Take 1 tablet (40 mg total) by mouth daily., Disp: 90 tablet, Rfl: 3   b complex vitamins capsule, Take 1 capsule by mouth daily., Disp: , Rfl:    divalproex (DEPAKOTE ER) 500 MG 24 hr tablet, Take 1 tablet (500 mg total) by mouth at bedtime., Disp: 90 tablet, Rfl: 0   DULoxetine HCl 40 MG CPEP, Take 1 capsule (40 mg total) by mouth daily., Disp: 90 capsule, Rfl: 0   fish oil-omega-3 fatty acids 1000 MG capsule, Take 2 g by mouth daily., Disp: , Rfl:    hydrochlorothiazide (HYDRODIURIL) 25 MG tablet, Take 1 tablet (25 mg total) by mouth daily., Disp: 90 tablet, Rfl: 3   irbesartan (AVAPRO) 300 MG tablet, Take 1 tablet (300 mg total) by mouth daily., Disp: 30 tablet, Rfl: 0   metoprolol succinate (TOPROL-XL) 50 MG 24 hr tablet, Take 1 tablet (50 mg total) by mouth daily. Take with or immediately following a  meal., Disp: 90 tablet, Rfl: 3   traZODone (DESYREL) 50 MG tablet, Take 1 tablet (50 mg total) by mouth at bedtime as needed for sleep., Disp: 90 tablet, Rfl: 3      Objective:    BP 116/74   Pulse 60   Temp 97.9 F (36.6 C) (Oral)   Ht 5\' 7"  (1.702 m)   Wt 199 lb 9.6 oz (90.5 kg)   SpO2 99%   BMI 31.26 kg/m   Wt Readings from Last 3 Encounters:  02/11/23 199 lb 9.6 oz (90.5 kg)  01/28/23 203 lb 9.6 oz (92.4 kg)  01/14/23 200 lb (90.7 kg)    Physical Exam Constitutional:      General: She is not in acute distress.    Appearance: Normal appearance.  She is normal weight. She is not ill-appearing, toxic-appearing or diaphoretic.  HENT:     Head: Normocephalic.  Cardiovascular:     Rate and Rhythm: Normal rate and regular rhythm.  Pulmonary:     Effort: Pulmonary effort is normal.  Musculoskeletal:        General: Normal range of motion.     Right lower leg: No edema.     Left lower leg: No edema.  Neurological:     General: No focal deficit present.     Mental Status: She is alert and oriented to person, place, and time. Mental status is at baseline.  Psychiatric:        Mood and Affect: Mood normal.        Behavior: Behavior normal.        Thought Content: Thought content normal.        Judgment: Judgment normal.           Assessment & Plan:  History of colon polyps  Sleep disorder -     traZODone HCl; Take 1 tablet (50 mg total) by mouth at bedtime as needed for sleep.  Dispense: 90 tablet; Refill: 3  Cessation of tobacco use in previous 12 months Assessment & Plan: Smoking cessation instruction/counseling given:  commended patient for quitting and reviewed strategies for preventing relapses    Anxiety state Assessment & Plan: Slowly improving. Continue f/u with psychology pending appt with psychiatry for consult.  Continue depakote ER 500 mg daily  Continue duloxetine 40 mg once daily  Goal to return to work date of 10/21 however will recommend return to work in remote status from home. Goal is to then return to non remote status 04/04/23. Working from home will decrease mental stimuli and allow pt to focus and get back into work delegation and time management without external stressors from being in a social setting at work with more distraction. This will also provide her with the ability to reach a therapeutic level with medication management and allow collaboration with psychiatry and psychology.   Essential hypertension Assessment & Plan: Continue amlodipine 10 mg once daily Continue HCT 25 mg once daily ,  will monitor potassium as h/o diuretic induced hypokalemia, repeat at next visit.  Continue metoprolol XL 50 mg every day Advised to bring blood pressure machine and cuff w her next week to calibrate.  Pt advised of the following:  Continue medication as prescribed. Monitor blood pressure periodically and/or when you feel symptomatic. Goal is <130/90 on average. Ensure that you have rested for 30 minutes prior to checking your blood pressure. Record your readings and bring them to your next visit if necessary.work on a low sodium diet.  Return for Return prior to 10/21 for f/u appt.  Mort Sawyers, MSN, APRN, FNP-C [facility]

## 2023-02-16 ENCOUNTER — Ambulatory Visit: Payer: 59 | Admitting: Family

## 2023-02-16 ENCOUNTER — Encounter: Payer: Self-pay | Admitting: Family

## 2023-02-16 VITALS — BP 110/68 | HR 68 | Ht 67.0 in | Wt 201.0 lb

## 2023-02-16 DIAGNOSIS — F428 Other obsessive-compulsive disorder: Secondary | ICD-10-CM | POA: Diagnosis not present

## 2023-02-16 DIAGNOSIS — F332 Major depressive disorder, recurrent severe without psychotic features: Secondary | ICD-10-CM | POA: Diagnosis not present

## 2023-02-16 DIAGNOSIS — I10 Essential (primary) hypertension: Secondary | ICD-10-CM

## 2023-02-16 MED ORDER — IRBESARTAN 300 MG PO TABS: 300.0000 mg | ORAL_TABLET | Freq: Every day | ORAL | 3 refills | Status: DC

## 2023-02-16 NOTE — Assessment & Plan Note (Signed)
Roving, pt ok with returning to work at this time.  We will follow regimen for up to 6 appointments per month at two hour intervals as well as 3 episodes per month for flare up prn at 24 hour periods at a time.  Continue and maintain consult visit with psychiatry, continue medications as prescribed, and also f/u as scheduled with therapist.

## 2023-02-16 NOTE — Telephone Encounter (Signed)
Form printed and placed in your box for review

## 2023-02-16 NOTE — Progress Notes (Signed)
Established Patient Office Visit  Subjective:      CC:  Chief Complaint  Patient presents with   Medical Management of Chronic Issues    HPI: Sandra Montes is a 52 y.o. female presenting on 02/16/2023 for Medical Management of Chronic Issues . Anxiety and mood disorder, pt projected to go back to work 10/21 and she states she is ready to go back for that date full time with intermittent leave with FMLA. Going to therapy every one week which has been helpful , seeing AJ tschoupp therapist who performs in EMDR and CBT. She does have an upcoming appt with psychiatry as well to establish care. Doing well with depakote ER 500 mg at bedtime as well as duloxetine 40 mg once daily. She does forsee a possible need for 1-3 call outs a month prn flares for 24 hour periods at a time. Especially with ramping up on medication management and therapy she will need about 6 times a month tentatively at 2 hour increments at a time for appointments.   Smoking cessation: has still continued to stop smoking.      Social history:  Relevant past medical, surgical, family and social history reviewed and updated as indicated. Interim medical history since our last visit reviewed.  Allergies and medications reviewed and updated.  DATA REVIEWED: CHART IN EPIC     ROS: Negative unless specifically indicated above in HPI.    Current Outpatient Medications:    albuterol (VENTOLIN HFA) 108 (90 Base) MCG/ACT inhaler, Inhale 2 puffs into the lungs every 4 (four) hours as needed for wheezing or shortness of breath., Disp: 18 g, Rfl: 0   amLODipine (NORVASC) 10 MG tablet, Take 1 tablet (10 mg total) by mouth daily., Disp: 90 tablet, Rfl: 3   atorvastatin (LIPITOR) 40 MG tablet, Take 1 tablet (40 mg total) by mouth daily., Disp: 90 tablet, Rfl: 3   b complex vitamins capsule, Take 1 capsule by mouth daily., Disp: , Rfl:    divalproex (DEPAKOTE ER) 500 MG 24 hr tablet, Take 1 tablet (500 mg total) by  mouth at bedtime., Disp: 90 tablet, Rfl: 0   DULoxetine HCl 40 MG CPEP, Take 1 capsule (40 mg total) by mouth daily., Disp: 90 capsule, Rfl: 0   fish oil-omega-3 fatty acids 1000 MG capsule, Take 2 g by mouth daily., Disp: , Rfl:    hydrochlorothiazide (HYDRODIURIL) 25 MG tablet, Take 1 tablet (25 mg total) by mouth daily., Disp: 90 tablet, Rfl: 3   irbesartan (AVAPRO) 300 MG tablet, Take 1 tablet (300 mg total) by mouth daily., Disp: 90 tablet, Rfl: 3   metoprolol succinate (TOPROL-XL) 50 MG 24 hr tablet, Take 1 tablet (50 mg total) by mouth daily. Take with or immediately following a meal., Disp: 90 tablet, Rfl: 3   traZODone (DESYREL) 50 MG tablet, Take 1 tablet (50 mg total) by mouth at bedtime as needed for sleep., Disp: 90 tablet, Rfl: 3      Objective:    BP 110/68   Pulse 68   Ht 5\' 7"  (1.702 m)   Wt 201 lb (91.2 kg)   SpO2 98%   BMI 31.48 kg/m   Wt Readings from Last 3 Encounters:  02/16/23 201 lb (91.2 kg)  02/11/23 199 lb 9.6 oz (90.5 kg)  01/28/23 203 lb 9.6 oz (92.4 kg)    Physical Exam Constitutional:      General: She is not in acute distress.    Appearance: Normal appearance. She is normal  weight. She is not ill-appearing, toxic-appearing or diaphoretic.  HENT:     Head: Normocephalic.  Cardiovascular:     Rate and Rhythm: Normal rate.  Pulmonary:     Effort: Pulmonary effort is normal.  Musculoskeletal:        General: Normal range of motion.  Neurological:     General: No focal deficit present.     Mental Status: She is alert and oriented to person, place, and time. Mental status is at baseline.  Psychiatric:        Attention and Perception: Attention and perception normal.        Mood and Affect: Mood and affect normal.        Speech: Speech normal.        Behavior: Behavior is uncooperative.        Thought Content: Thought content normal.        Cognition and Memory: Cognition and memory normal.        Judgment: Judgment normal.            Assessment & Plan:  Severe episode of recurrent major depressive disorder, without psychotic features [facility]) Assessment & Plan: Roving, pt ok with returning to work at this time.  We will follow regimen for up to 6 appointments per month at two hour intervals as well as 3 episodes per month for flare up prn at 24 hour periods at a time.  Continue and maintain consult visit with psychiatry, continue medications as prescribed, and also f/u as scheduled with therapist.    Obsessional thoughts Assessment & Plan: Improving.    Essential hypertension -     Irbesartan; Take 1 tablet (300 mg total) by mouth daily.  Dispense: 90 tablet; Refill: 3     Return in about 3 months (around 05/19/2023) for f/u anxiety.  Mort Sawyers, MSN, APRN, FNP-C  [facility]

## 2023-02-16 NOTE — Assessment & Plan Note (Signed)
Improving.

## 2023-02-18 NOTE — Telephone Encounter (Signed)
Please let her know that she can pick up form, is in outbox.  Looks like she needs to sign.

## 2023-03-02 ENCOUNTER — Ambulatory Visit: Payer: 59 | Admitting: Orthopedic Surgery

## 2023-03-02 ENCOUNTER — Encounter: Payer: Self-pay | Admitting: Orthopedic Surgery

## 2023-03-02 VITALS — BP 111/77 | HR 64 | Ht 67.0 in | Wt 198.0 lb

## 2023-03-02 DIAGNOSIS — M25361 Other instability, right knee: Secondary | ICD-10-CM | POA: Diagnosis not present

## 2023-03-02 DIAGNOSIS — M25561 Pain in right knee: Secondary | ICD-10-CM

## 2023-03-02 DIAGNOSIS — G8929 Other chronic pain: Secondary | ICD-10-CM | POA: Diagnosis not present

## 2023-03-02 NOTE — Addendum Note (Signed)
Addended by: Baird Kay on: 03/02/2023 10:54 AM   Modules accepted: Orders

## 2023-03-02 NOTE — Progress Notes (Signed)
Return patient Visit  Assessment: Sandra Montes is a 52 y.o. female with the following: 1. Chronic pain of right knee 2.  Right knee effusion  Plan: Sandra Montes continues to have pain in the right knee.  She has tried activity modifications, medicines, physical therapy as well as intra-articular steroid injections.  She continues to have pain.  She notes buckling sensations.  She has pain with hyperflexion.  She is tenderness along the medial and lateral joint line.  Radiographs demonstrate some degenerative changes.  At this point, the next reasonable step is to obtain an MRI of the right knee.  Once the results are available, she will return to clinic to discuss the findings.  Continue with medicines as needed.  Activities as tolerated.  Follow-up: Return for After MRI.  Subjective:  Chief Complaint  Patient presents with   Knee Pain    Right states physical therapy made her worse now knee is more swollen pain swelling into right ankle and right hip pops now also     History of Present Illness: Sandra Montes is a 52 y.o. female who returns for evaluation of right knee pain.  I saw her in clinic a couple months ago.  No specific injury in her right knee.  She has been working with therapy since I saw her last.  She notes that therapy has worsened her pain.  She continues to have diffuse pain in the right knee.  She notes some swelling, which is mostly prominent anterior and lateral.  She is also developing some pain in the posterior aspect of the right leg.  Occasionally, her knee will buckle.  Review of Systems: No fevers or chills No numbness or tingling No chest pain No shortness of breath No bowel or bladder dysfunction No GI distress No headaches      Objective: BP 111/77   Pulse 64   Ht 5\' 7"  (1.702 m)   Wt 198 lb (89.8 kg)   BMI 31.01 kg/m   Physical Exam:  General: Alert and oriented. and No acute distress. Gait: Normal gait.  Evaluation the  right knee demonstrates a mild effusion.  She has tenderness to palpation of both the medial and lateral joint lines.  She has pain with hyperflexion.  Negative Lachman.  Pain with varus and valgus stress, but without obvious laxity.  Toes are warm and well-perfused.   IMAGING: No new imaging obtained today        New Medications:  No orders of the defined types were placed in this encounter.     Oliver Barre, MD  03/02/2023 10:47 AM

## 2023-03-06 ENCOUNTER — Encounter: Payer: Self-pay | Admitting: Orthopedic Surgery

## 2023-03-07 ENCOUNTER — Encounter: Payer: Self-pay | Admitting: Family

## 2023-03-08 NOTE — Telephone Encounter (Signed)
Do you want to see patient in office?

## 2023-03-17 ENCOUNTER — Other Ambulatory Visit: Payer: Self-pay | Admitting: Family

## 2023-03-18 ENCOUNTER — Ambulatory Visit: Payer: 59 | Admitting: Family

## 2023-03-18 ENCOUNTER — Encounter: Payer: Self-pay | Admitting: Family

## 2023-03-18 VITALS — BP 126/80 | HR 98 | Temp 97.5°F | Ht 67.0 in | Wt 216.0 lb

## 2023-03-18 DIAGNOSIS — J069 Acute upper respiratory infection, unspecified: Secondary | ICD-10-CM

## 2023-03-18 DIAGNOSIS — J029 Acute pharyngitis, unspecified: Secondary | ICD-10-CM | POA: Diagnosis not present

## 2023-03-18 LAB — POCT RAPID STREP A (OFFICE): Rapid Strep A Screen: NEGATIVE

## 2023-03-18 MED ORDER — HYDROXYZINE HCL 25 MG PO TABS: 25.0000 mg | ORAL_TABLET | Freq: Three times a day (TID) | ORAL | 0 refills | Status: DC | PRN

## 2023-03-18 NOTE — Progress Notes (Signed)
Established Patient Office Visit  Subjective:   Patient ID: Sandra Montes, female    DOB: 14-Apr-1971  Age: 52 y.o. MRN: [id number]  CC:  Chief Complaint  Patient presents with   Acute Visit    Reports headache, sore throat and increased fatigue.    HPI: Sandra Montes is a 52 y.o. female presenting on 03/18/2023 for Acute Visit (Reports headache, sore throat and increased fatigue.)   Yesterday had an anxiety attack, and really gave herself some increased anxiety thinking about her chest tightness and assuming she was having a heart attack, but started some deep breathing, took hydroxyzine and then began to feel better and calmer. She knows it was anxiety related. No chest pain.    Yesterday started with headache, lethargy, body aches. No fever. Covid tested this am and was negative. No ear pain. No nasal congestion or sinus pressure.   Has not taken any otc medication.        ROS: Negative unless specifically indicated above in HPI.   Relevant past medical history reviewed and updated as indicated.   Allergies and medications reviewed and updated.   Current Outpatient Medications:    albuterol (VENTOLIN HFA) 108 (90 Base) MCG/ACT inhaler, Inhale 2 puffs into the lungs every 4 (four) hours as needed for wheezing or shortness of breath., Disp: 18 g, Rfl: 0   atorvastatin (LIPITOR) 40 MG tablet, Take 1 tablet (40 mg total) by mouth daily., Disp: 90 tablet, Rfl: 3   b complex vitamins capsule, Take 1 capsule by mouth daily., Disp: , Rfl:    divalproex (DEPAKOTE ER) 500 MG 24 hr tablet, Take 1 tablet (500 mg total) by mouth at bedtime., Disp: 90 tablet, Rfl: 0   DULoxetine HCl 40 MG CPEP, Take 1 capsule (40 mg total) by mouth daily., Disp: 90 capsule, Rfl: 0   fish oil-omega-3 fatty acids 1000 MG capsule, Take 2 g by mouth daily., Disp: , Rfl:    hydrochlorothiazide (HYDRODIURIL) 25 MG tablet, Take 1 tablet (25 mg total) by mouth daily., Disp: 90 tablet, Rfl: 3    irbesartan (AVAPRO) 300 MG tablet, Take 1 tablet (300 mg total) by mouth daily., Disp: 90 tablet, Rfl: 3   metoprolol succinate (TOPROL-XL) 50 MG 24 hr tablet, Take 1 tablet (50 mg total) by mouth daily. Take with or immediately following a meal., Disp: 90 tablet, Rfl: 3   traZODone (DESYREL) 50 MG tablet, Take 1 tablet (50 mg total) by mouth at bedtime as needed for sleep., Disp: 90 tablet, Rfl: 3   hydrOXYzine (ATARAX) 25 MG tablet, Take 1 tablet (25 mg total) by mouth 3 (three) times daily as needed., Disp: 90 tablet, Rfl: 0  Allergies  Allergen Reactions   Buspar [Buspirone] Other (See Comments)    Optical migraines   Lisinopril Cough   Morphine Itching and Other (See Comments)    "tried to take her face off due to itching"    Sulfa Antibiotics Other (See Comments)    Passes out      Objective:   BP 126/80 (BP Location: Left Arm, Patient Position: Sitting, Cuff Size: Large)   Pulse 98   Temp (!) 97.5 F (36.4 C) (Temporal)   Ht 5\' 7"  (1.702 m)   Wt 216 lb (98 kg)   SpO2 98%   BMI 33.83 kg/m    Physical Exam Constitutional:      General: She is not in acute distress.    Appearance: Normal appearance. She is normal weight. She is  not ill-appearing, toxic-appearing or diaphoretic.  HENT:     Head: Normocephalic.     Right Ear: A middle ear effusion (clear) is present.     Left Ear: Tympanic membrane normal.     Nose: Nose normal.     Right Turbinates: Swollen.     Left Turbinates: Swollen.     Mouth/Throat:     Mouth: Mucous membranes are dry.     Pharynx: Posterior oropharyngeal erythema and postnasal drip present. No oropharyngeal exudate.  Eyes:     Extraocular Movements: Extraocular movements intact.     Pupils: Pupils are equal, round, and reactive to light.  Cardiovascular:     Rate and Rhythm: Normal rate and regular rhythm.     Pulses: Normal pulses.     Heart sounds: Normal heart sounds.  Pulmonary:     Effort: Pulmonary effort is normal.     Breath  sounds: Normal breath sounds.  Musculoskeletal:     Cervical back: Normal range of motion.  Neurological:     General: No focal deficit present.     Mental Status: She is alert and oriented to person, place, and time. Mental status is at baseline.  Psychiatric:        Mood and Affect: Mood normal.        Behavior: Behavior normal.        Thought Content: Thought content normal.        Judgment: Judgment normal.     Assessment & Plan:  Sore throat -     POCT rapid strep A  Upper respiratory infection, viral Assessment & Plan: Covid at home neg  Strep in office neg  Suspect viral  Advised patient on supportive measures:  Be sure to rest, drink plenty of fluids, and use tylenol or ibuprofen as needed for pain. Follow up if fever >101, if symptoms worsen or if symptoms are not improved in 3 days. Patient verbalizes understanding.         Follow up plan: Return if symptoms worsen or fail to improve.  Mort Sawyers, FNP

## 2023-03-20 ENCOUNTER — Ambulatory Visit
Admission: RE | Admit: 2023-03-20 | Discharge: 2023-03-20 | Disposition: A | Discharge status: Home / Self Care | Payer: 59 | Source: Ambulatory Visit | Attending: Orthopedic Surgery | Admitting: Orthopedic Surgery

## 2023-03-20 DIAGNOSIS — M25361 Other instability, right knee: Secondary | ICD-10-CM

## 2023-03-20 DIAGNOSIS — G8929 Other chronic pain: Secondary | ICD-10-CM

## 2023-03-22 DIAGNOSIS — J069 Acute upper respiratory infection, unspecified: Secondary | ICD-10-CM | POA: Insufficient documentation

## 2023-03-22 NOTE — Assessment & Plan Note (Signed)
Covid at home neg  Strep in office neg  Suspect viral  Advised patient on supportive measures:  Be sure to rest, drink plenty of fluids, and use tylenol or ibuprofen as needed for pain. Follow up if fever >101, if symptoms worsen or if symptoms are not improved in 3 days. Patient verbalizes understanding.

## 2023-03-24 ENCOUNTER — Encounter: Payer: Self-pay | Admitting: Psychiatry

## 2023-03-24 ENCOUNTER — Ambulatory Visit: Payer: 59 | Admitting: Psychiatry

## 2023-03-24 VITALS — BP 125/81 | HR 58 | Temp 97.3°F | Ht 67.0 in | Wt 214.8 lb

## 2023-03-24 DIAGNOSIS — F332 Major depressive disorder, recurrent severe without psychotic features: Secondary | ICD-10-CM | POA: Diagnosis not present

## 2023-03-24 DIAGNOSIS — F1011 Alcohol abuse, in remission: Secondary | ICD-10-CM | POA: Insufficient documentation

## 2023-03-24 DIAGNOSIS — F1211 Cannabis abuse, in remission: Secondary | ICD-10-CM

## 2023-03-24 DIAGNOSIS — Z79899 Other long term (current) drug therapy: Secondary | ICD-10-CM | POA: Diagnosis not present

## 2023-03-24 DIAGNOSIS — F4310 Post-traumatic stress disorder, unspecified: Secondary | ICD-10-CM | POA: Diagnosis not present

## 2023-03-24 MED ORDER — DIVALPROEX SODIUM ER 500 MG PO TB24: 500.0000 mg | ORAL_TABLET | Freq: Every day | ORAL | 0 refills | Status: DC

## 2023-03-24 MED ORDER — DULOXETINE HCL 20 MG PO CPEP
20.0000 mg | ORAL_CAPSULE | Freq: Two times a day (BID) | ORAL | 1 refills | Status: DC
Start: 2023-03-24 — End: 2023-09-02

## 2023-03-24 MED ORDER — DIVALPROEX SODIUM ER 250 MG PO TB24
250.0000 mg | ORAL_TABLET | Freq: Every day | ORAL | 0 refills | Status: DC
Start: 2023-03-24 — End: 2023-06-18
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
## 2023-03-29 ENCOUNTER — Telehealth: Payer: Self-pay | Admitting: Psychiatry

## 2023-03-29 NOTE — Telephone Encounter (Signed)
[employer] ROI for 2 way communication to therapist signed and emailed 03/24/23

## 2023-04-20 ENCOUNTER — Encounter: Payer: Self-pay | Admitting: Orthopedic Surgery

## 2023-04-20 ENCOUNTER — Ambulatory Visit: Payer: 59 | Admitting: Orthopedic Surgery

## 2023-04-20 VITALS — Ht 67.0 in | Wt 230.0 lb

## 2023-04-20 DIAGNOSIS — S83241D Other tear of medial meniscus, current injury, right knee, subsequent encounter: Secondary | ICD-10-CM

## 2023-04-20 DIAGNOSIS — M25361 Other instability, right knee: Secondary | ICD-10-CM

## 2023-04-20 DIAGNOSIS — S83271D Complex tear of lateral meniscus, current injury, right knee, subsequent encounter: Secondary | ICD-10-CM

## 2023-04-20 MED ORDER — CYCLOBENZAPRINE HCL 10 MG PO TABS: 10.0000 mg | ORAL_TABLET | Freq: Two times a day (BID) | ORAL | 0 refills | Status: DC | PRN

## 2023-04-20 MED ORDER — PREDNISONE 10 MG (21) PO TBPK: ORAL_TABLET | ORAL | 0 refills | Status: DC

## 2023-04-22 ENCOUNTER — Encounter: Payer: Self-pay | Admitting: Orthopedic Surgery

## 2023-04-22 NOTE — Progress Notes (Signed)
Return patient Visit  Assessment: Sandra Montes is a 52 y.o. female with the following: Right knee pain; medial and lateral meniscus tears  Plan: Sandra Montes has pain in her right knee.  MRI demonstrates tears of both the medial lateral meniscus.  She does have some underlying arthritis.  She is exhausted nonoperative management, including medications, injections, therapy without resolution of her symptoms.  She is interested in surgery.  Procedure was discussed in detail.  After answering all questions, she would like to proceed with right knee arthroscopy, with partial medial and lateral meniscectomy.  We will work to obtain medical clearance, and then schedule surgery.  She states understanding.  I provided her with some prednisone, as well as Flexeril to help with her pain.  Risks and benefits of the surgery, including, but not limited to infection, bleeding, persistent pain, need for further surgery, blood clots, worsening arthritis and more severe complications associated with anesthesia were discussed with the patient.  The patient has elected to proceed.   Follow-up: Return for After surgery.  Subjective:  Chief Complaint  Patient presents with   Knee Pain    MRI results right knee    History of Present Illness: Sandra Montes is a 52 y.o. female who returns for evaluation of right knee pain.  She continues to have difficulty.  Therapy has made her pain worse.  Occasional buckling sensations.  She does note some swelling.  It is affecting her preferred activities.  She has obtained an MRI and is here to discuss the findings.   Review of Systems: No fevers or chills No numbness or tingling No chest pain No shortness of breath No bowel or bladder dysfunction No GI distress No headaches      Objective: Ht 5\' 7"  (1.702 m)   Wt 230 lb (104.3 kg)   BMI 36.02 kg/m   Physical Exam:  General: Alert and oriented. and No acute distress. Gait: Normal  gait.  Evaluation the right knee demonstrates a mild effusion.  She has tenderness to palpation of both the medial and lateral joint lines.  She has pain with hyperflexion.  Negative Lachman.  Pain with varus and valgus stress, but without obvious laxity.  Toes are warm and well-perfused.   IMAGING: I personally reviewed images previously obtained in clinic  Right knee MRI  IMPRESSION: 1. Mild extrusion of the body of the medial meniscus, greatest anteriorly. There is a vertically oriented tear extending through the superior and likely inferior articular surfaces within the anterior segment of the body of the medial meniscus. 2. Mild-to-moderate extrusion of the anterior segment of the body of the lateral meniscus. There is globular increased proton density signal within the peripheral third of the meniscal triangle of the anterior horn of the lateral meniscus, suggesting an intrameniscal cyst that also mildly extends as a tear through the superior articular surface. 3. Moderate-to-large joint effusion with mild synovial thickening. Tiny Baker's cyst. 4. Moderate medial and patellofemoral compartment cartilage degenerative changes.      New Medications:  Meds ordered this encounter  Medications   predniSONE (STERAPRED UNI-PAK 21 TAB) 10 MG (21) TBPK tablet    Sig: 10 mg DS 12 as directed    Dispense:  48 tablet    Refill:  0   cyclobenzaprine (FLEXERIL) 10 MG tablet    Sig: Take 1 tablet (10 mg total) by mouth 2 (two) times daily as needed.    Dispense:  20 tablet    Refill:  0  Oliver Barre, MD  04/22/2023 8:34 AM

## 2023-05-05 ENCOUNTER — Other Ambulatory Visit: Payer: Self-pay | Admitting: Nurse Practitioner

## 2023-05-07 ENCOUNTER — Telehealth: Payer: 59 | Admitting: Psychiatry

## 2023-05-12 ENCOUNTER — Telehealth: Payer: Self-pay | Admitting: Family

## 2023-05-12 NOTE — Telephone Encounter (Signed)
 Received a surgical clearance form for the pt on 05/11/23 from [facility]. Pt needs to have R knee arthroscopy with partial menisectomy done. Per Melba Spittle, pt will need to have a preop appointment before the form can be completed. Called pt and left a message for her to return my call.

## 2023-05-13 NOTE — Telephone Encounter (Signed)
 Appointment has been scheduled for 05/17/23 at 1200.

## 2023-05-17 ENCOUNTER — Ambulatory Visit (INDEPENDENT_AMBULATORY_CARE_PROVIDER_SITE_OTHER)
Admission: RE | Admit: 2023-05-17 | Discharge: 2023-05-17 | Disposition: A | Discharge status: Home / Self Care | Payer: 59 | Source: Ambulatory Visit | Attending: Family | Admitting: Family

## 2023-05-17 ENCOUNTER — Ambulatory Visit: Payer: 59 | Admitting: Family

## 2023-05-17 ENCOUNTER — Telehealth: Payer: Self-pay | Admitting: Family

## 2023-05-17 ENCOUNTER — Encounter: Payer: Self-pay | Admitting: Family

## 2023-05-17 VITALS — BP 118/78 | HR 68 | Temp 98.4°F | Ht 67.0 in | Wt 230.0 lb

## 2023-05-17 DIAGNOSIS — I10 Essential (primary) hypertension: Secondary | ICD-10-CM

## 2023-05-17 DIAGNOSIS — R140 Abdominal distension (gaseous): Secondary | ICD-10-CM | POA: Insufficient documentation

## 2023-05-17 DIAGNOSIS — S83421A Sprain of lateral collateral ligament of right knee, initial encounter: Secondary | ICD-10-CM | POA: Insufficient documentation

## 2023-05-17 DIAGNOSIS — Z01818 Encounter for other preprocedural examination: Secondary | ICD-10-CM | POA: Insufficient documentation

## 2023-05-17 DIAGNOSIS — R1012 Left upper quadrant pain: Secondary | ICD-10-CM | POA: Diagnosis not present

## 2023-05-17 DIAGNOSIS — R1013 Epigastric pain: Secondary | ICD-10-CM

## 2023-05-17 DIAGNOSIS — R0609 Other forms of dyspnea: Secondary | ICD-10-CM | POA: Insufficient documentation

## 2023-05-17 DIAGNOSIS — R1030 Lower abdominal pain, unspecified: Secondary | ICD-10-CM

## 2023-05-17 DIAGNOSIS — R9431 Abnormal electrocardiogram [ECG] [EKG]: Secondary | ICD-10-CM | POA: Insufficient documentation

## 2023-05-17 DIAGNOSIS — R635 Abnormal weight gain: Secondary | ICD-10-CM | POA: Insufficient documentation

## 2023-05-17 DIAGNOSIS — S83206S Unspecified tear of unspecified meniscus, current injury, right knee, sequela: Secondary | ICD-10-CM | POA: Insufficient documentation

## 2023-05-17 DIAGNOSIS — Z87891 Personal history of nicotine dependence: Secondary | ICD-10-CM

## 2023-05-17 DIAGNOSIS — J432 Centrilobular emphysema: Secondary | ICD-10-CM

## 2023-05-17 DIAGNOSIS — R1912 Hyperactive bowel sounds: Secondary | ICD-10-CM

## 2023-05-17 DIAGNOSIS — Z79899 Other long term (current) drug therapy: Secondary | ICD-10-CM

## 2023-05-17 LAB — URINALYSIS, ROUTINE W REFLEX MICROSCOPIC
Bilirubin Urine: NEGATIVE
Hgb urine dipstick: NEGATIVE
Ketones, ur: NEGATIVE
Leukocytes,Ua: NEGATIVE
Nitrite: NEGATIVE
RBC / HPF: NONE SEEN (ref 0–?)
Specific Gravity, Urine: 1.01 (ref 1.000–1.030)
Total Protein, Urine: NEGATIVE
Urine Glucose: NEGATIVE
Urobilinogen, UA: 0.2 (ref 0.0–1.0)
pH: 7 (ref 5.0–8.0)

## 2023-05-17 LAB — COMPREHENSIVE METABOLIC PANEL WITH GFR
ALT: 11 U/L (ref 0–35)
AST: 14 U/L (ref 0–37)
Albumin: 4.3 g/dL (ref 3.5–5.2)
Alkaline Phosphatase: 49 U/L (ref 39–117)
BUN: 10 mg/dL (ref 6–23)
CO2: 36 meq/L — ABNORMAL HIGH (ref 19–32)
Calcium: 9.1 mg/dL (ref 8.4–10.5)
Chloride: 97 meq/L (ref 96–112)
Creatinine, Ser: 0.62 mg/dL (ref 0.40–1.20)
GFR: 101.92 mL/min (ref >60.00)
Glucose, Bld: 92 mg/dL (ref 70–99)
Potassium: 3.8 meq/L (ref 3.5–5.1)
Sodium: 140 meq/L (ref 135–145)
Total Bilirubin: 0.5 mg/dL (ref 0.2–1.2)
Total Protein: 6.9 g/dL (ref 6.0–8.3)

## 2023-05-17 LAB — CBC WITH DIFFERENTIAL/PLATELET
Basophils Absolute: 0 10*3/uL (ref 0.0–0.1)
Basophils Relative: 0.5 % (ref 0.0–3.0)
Eosinophils Absolute: 0.3 10*3/uL (ref 0.0–0.7)
Eosinophils Relative: 4.2 % (ref 0.0–5.0)
HCT: 43.9 % (ref 36.0–46.0)
Hemoglobin: 15 g/dL (ref 12.0–15.0)
Lymphocytes Relative: 31.6 % (ref 12.0–46.0)
Lymphs Abs: 2 10*3/uL (ref 0.7–4.0)
MCHC: 34.2 g/dL (ref 30.0–36.0)
MCV: 89.1 fl (ref 78.0–100.0)
Monocytes Absolute: 0.6 10*3/uL (ref 0.1–1.0)
Monocytes Relative: 9 % (ref 3.0–12.0)
Neutro Abs: 3.5 10*3/uL (ref 1.4–7.7)
Neutrophils Relative %: 54.7 % (ref 43.0–77.0)
Platelets: 171 10*3/uL (ref 150.0–400.0)
RBC: 4.93 Mil/uL (ref 3.87–5.11)
RDW: 13.6 % (ref 11.5–15.5)
WBC: 6.5 10*3/uL (ref 4.0–10.5)

## 2023-05-17 LAB — T4, FREE: Free T4: 0.78 ng/dL (ref 0.60–1.60)

## 2023-05-17 LAB — HEMOGLOBIN A1C: Hgb A1c MFr Bld: 6.1 % (ref 4.6–6.5)

## 2023-05-17 LAB — PROTIME-INR
INR: 1 ratio (ref 0.8–1.0)
Prothrombin Time: 10.4 s (ref 9.6–13.1)

## 2023-05-17 LAB — AMYLASE: Amylase: 76 U/L (ref 27–131)

## 2023-05-17 LAB — LIPASE: Lipase: 19 U/L (ref 11.0–59.0)

## 2023-05-17 NOTE — Progress Notes (Signed)
 Assessment & Plan:  Pre-op evaluation Assessment & Plan: Cxr EKG ordered  Lab work ordered Pending results.  Referral placed for cardiology for abn EKG and clearance  Emphysema controlled.  Ct abd pelvis today to r/o abd concerns  Orders: -     CBC with Differential/Platelet -     POCT urine pregnancy -     Urinalysis, Routine w reflex microscopic -     Protime-INR -     Comprehensive metabolic panel -     EKG 12-Lead -     DG Chest 2 View; Future  Essential hypertension  Cessation of tobacco use in previous 12 months  Centrilobular emphysema (HCC) Assessment & Plan: Stable   Epigastric pain -     H. pylori breath test -     CT ABDOMEN PELVIS W CONTRAST; Future  Abdominal distension Assessment & Plan: New finding  Ct abd pelvis ordered for this and abd pain.  Abn weight gain as well contributing (ordering thyroid panel pending results ) Discussed red flag symptoms R/o diverticulitis, constipation, obstruction. Pancreatitis  Prior cholecystectomy  Orders: -     Food Allergy Profile w/ Reflexes -     CT ABDOMEN PELVIS W CONTRAST; Future  Abnormal weight gain -     T4, free -     CT ABDOMEN PELVIS W CONTRAST; Future -     Hemoglobin A1c  High risk medication use Assessment & Plan: Ordering valproic acid for Dr. Tere Felts will forward  On depakote  Orders: -     Valproic acid level  Acute meniscal tear of knee, right, sequela  Tear of lateral collateral ligament of right knee, initial encounter Assessment & Plan: Pre op workup in progress.     Lower abdominal pain -     CT ABDOMEN PELVIS W CONTRAST; Future  Left upper quadrant pain -     Amylase -     Lipase -     CT ABDOMEN PELVIS W CONTRAST; Future  Increased bowel sounds -     CT ABDOMEN PELVIS W CONTRAST; Future  Electrocardiogram showing T wave abnormalities Assessment & Plan: Along with DOE will send to cardiology for pre op clearance as well as evaluation.  Pt states no cp palp but  with doe and sob at times.  She does have accompanying emphysemia (which so far as been controlled) as well as significant abn weight gain which might also be contributing.    Orders: -     Ambulatory referral to Cardiology  DOE (dyspnea on exertion) -     Ambulatory referral to Cardiology  Morbid obesity (HCC) -     Hemoglobin A1c   Total time in office, clinic with ordering imaging, labs, setting up stat orders, reviewing charts totaled 46 minutes   I have independently evaluated patient.  Lewanda Perea is a 53 y.o. female who is low risk for a intermediate risk surgery.  There are modifiable risk factors (weight) Trevor Fudge [place]'s RCRI/NSQIP calculation for MACE is: 3.9% low risk.    Review meds: If indicated, NO ACE-I/ARB day of surgery. OK to do BB or statin day of (esp if vascular sx)  Return in about 2 weeks (around 05/31/2023) for follow up abdominal concerns.  Felicita Horns, MSN, APRN, FNP-C [facility]  Subjective:      HPI: Pt is a 53 y.o. female who is here for preoperative clearance for right knee arthroscopy with partial meniscectomy. The anesthesia will be choice anesthesia.  1) High Risk Cardiac Conditions:  1) Recent MI - No.  2) Decompensated Heart Failure - No.  3) Unstable angina - No.  4) Symptomatic arrythmia - No.  5) Sx Valvular Disease - No.  2) Intermediate Risk Factors: DM, CKD, CVA, CHF, CAD - Yes.   Emphysema however with improvement after quitting smoking. Will order CXR. DOE with weight gain and lack of activity due to knee pain. Only needed albuterol inhaler one time in the last one month.   2) Functional Status: > 4 mets (Walk, run, climb stairs) Yes.  Howie Mackie Activity Status Index: 7.99  3) Surgery Specific Risk: Extensive ops)  Intermediate (Carotid, Head and Neck, Orthopaedic )           4) Further Noninvasive evaluation:   1) EKG - Yes.     Hx of MI, CVA, CAD, DM, CKD  2) Echo - No.   Worsening  dyspnea or CHF without an echo in the past year  3) Stress Testing - Active Cardiac Disease - No.  4) CXR Yes.     If asymptomatic, healthy, no respiratory symptoms it is not needed  5)PFTs No.   OSA, OHS, or significant cardiopulmonary history  5) Need for medical therapy - Beta Blocker, Statins indicated ? No.  New complaints: Is having bloating in her stomach with frequent gas. Bloating is worse after eating and states is 'starving' all of the time since starting depakote. She does note some epigastric pain without heartburn. She burps often as well. She has been eating a lot of the same foods, protein bars, grapes, hummus, potatoes, salads. Not eating a lot of meat. No diarrhea or constipation, not vomiting.   Social history:  Relevant past medical, surgical, family and social history reviewed and updated as indicated. Interim medical history since our last visit reviewed.  Allergies and medications reviewed and updated.  DATA REVIEWED: CHART IN EPIC  ROS: Negative unless specifically indicated above in HPI.    Current Outpatient Medications:    albuterol (VENTOLIN HFA) 108 (90 Base) MCG/ACT inhaler, INHALE 2 PUFFS INTO THE LUNGS EVERY 4 HOURS AS NEEDED FOR WHEEZING OR SHORTNESS OF BREATH, Disp: 8.5 g, Rfl: 0   atorvastatin (LIPITOR) 40 MG tablet, Take 1 tablet (40 mg total) by mouth daily., Disp: 90 tablet, Rfl: 3   b complex vitamins capsule, Take 1 capsule by mouth daily., Disp: , Rfl:    cyclobenzaprine (FLEXERIL) 10 MG tablet, Take 1 tablet (10 mg total) by mouth 2 (two) times daily as needed., Disp: 20 tablet, Rfl: 0   divalproex (DEPAKOTE ER) 250 MG 24 hr tablet, Take 1 tablet (250 mg total) by mouth daily with supper. Take along with 500 mg , total of 750 mg daily, Disp: 90 tablet, Rfl: 0   divalproex (DEPAKOTE ER) 500 MG 24 hr tablet, Take 1 tablet (500 mg total) by mouth at bedtime. Take along with 250 mg daily, Disp: 90 tablet, Rfl: 0   DULoxetine (CYMBALTA) 20 MG  capsule, Take 1 capsule (20 mg total) by mouth 2 (two) times daily., Disp: 180 capsule, Rfl: 1   fish oil-omega-3 fatty acids 1000 MG capsule, Take 2 g by mouth daily., Disp: , Rfl:    hydrochlorothiazide (HYDRODIURIL) 25 MG tablet, Take 1 tablet (25 mg total) by mouth daily., Disp: 90 tablet, Rfl: 3   hydrOXYzine (ATARAX) 25 MG tablet, Take 1 tablet (25 mg total) by mouth 3 (three) times daily as needed., Disp: 90 tablet, Rfl: 0  irbesartan (AVAPRO) 300 MG tablet, Take 1 tablet (300 mg total) by mouth daily., Disp: 90 tablet, Rfl: 3   metoprolol succinate (TOPROL-XL) 50 MG 24 hr tablet, Take 1 tablet (50 mg total) by mouth daily. Take with or immediately following a meal., Disp: 90 tablet, Rfl: 3   traZODone (DESYREL) 50 MG tablet, Take 1 tablet (50 mg total) by mouth at bedtime as needed for sleep., Disp: 90 tablet, Rfl: 3      Objective:    BP 118/78 (BP Location: Left Arm, Patient Position: Sitting, Cuff Size: Large)   Pulse 68   Temp 98.4 F (36.9 C) (Temporal)   Ht 5\' 7"  (1.702 m)   Wt 230 lb (104.3 kg)   SpO2 98%   BMI 36.02 kg/m   Wt Readings from Last 3 Encounters:  05/17/23 230 lb (104.3 kg)  04/20/23 230 lb (104.3 kg)  03/18/23 216 lb (98 kg)   Lab Results  Component Value Date   HGBA1C 5.6 10/20/2022     Physical Exam Constitutional:      General: She is not in acute distress.    Appearance: Normal appearance. She is normal weight. She is not ill-appearing.  HENT:     Head: Normocephalic.     Right Ear: Tympanic membrane normal.     Left Ear: Tympanic membrane normal.     Nose: Nose normal.     Mouth/Throat:     Mouth: Mucous membranes are moist.  Eyes:     Extraocular Movements: Extraocular movements intact.     Pupils: Pupils are equal, round, and reactive to light.  Cardiovascular:     Rate and Rhythm: Normal rate and regular rhythm.  Pulmonary:     Effort: Pulmonary effort is normal.     Breath sounds: Normal breath sounds.  Abdominal:     General:  Abdomen is flat. Bowel sounds are increased.     Palpations: Abdomen is soft.     Tenderness: There is abdominal tenderness in the right lower quadrant, epigastric area, left upper quadrant and left lower quadrant. There is no guarding or rebound.     Comments: Luq abdominal pain very mild  Llq 10/10  Rlq 7/10   Musculoskeletal:     Cervical back: Normal range of motion.     Right knee: Effusion present. Decreased range of motion. Tenderness present.  Skin:    General: Skin is warm.     Capillary Refill: Capillary refill takes less than 2 seconds.  Neurological:     General: No focal deficit present.     Mental Status: She is alert.  Psychiatric:        Mood and Affect: Mood normal.        Behavior: Behavior normal.        Thought Content: Thought content normal.        Judgment: Judgment normal.

## 2023-05-17 NOTE — Assessment & Plan Note (Signed)
 Along with DOE will send to cardiology for pre op clearance as well as evaluation.  Pt states no cp palp but with doe and sob at times.  She does have accompanying emphysemia (which so far as been controlled) as well as significant abn weight gain which might also be contributing.

## 2023-05-17 NOTE — Assessment & Plan Note (Signed)
 Cxr EKG ordered  Lab work ordered Pending results.  Referral placed for cardiology for abn EKG and clearance  Emphysema controlled.  Ct abd pelvis today to r/o abd concerns

## 2023-05-17 NOTE — Assessment & Plan Note (Signed)
 Ordering valproic acid for Dr. Elna Breslow will forward  On depakote

## 2023-05-17 NOTE — Assessment & Plan Note (Signed)
 Stable

## 2023-05-17 NOTE — Assessment & Plan Note (Signed)
 Pre op workup in progress.

## 2023-05-17 NOTE — Telephone Encounter (Signed)
 Any chance we can add on hcg pregnancy? Orders in future.  Pt was unable to provide [place] with urine sample in office.

## 2023-05-17 NOTE — Assessment & Plan Note (Addendum)
 New finding  Ct abd pelvis ordered for this and abd pain.  Abn weight gain as well contributing (ordering thyroid panel pending results ) Discussed red flag symptoms R/o diverticulitis, constipation, obstruction. Pancreatitis  Prior cholecystectomy

## 2023-05-18 ENCOUNTER — Ambulatory Visit
Admission: RE | Admit: 2023-05-18 | Discharge: 2023-05-18 | Disposition: A | Discharge status: Home / Self Care | Payer: 59 | Source: Ambulatory Visit | Attending: Family | Admitting: Family

## 2023-05-18 ENCOUNTER — Encounter: Payer: Self-pay | Admitting: Family

## 2023-05-18 DIAGNOSIS — R1013 Epigastric pain: Secondary | ICD-10-CM | POA: Diagnosis present

## 2023-05-18 DIAGNOSIS — R635 Abnormal weight gain: Secondary | ICD-10-CM | POA: Insufficient documentation

## 2023-05-18 DIAGNOSIS — R140 Abdominal distension (gaseous): Secondary | ICD-10-CM | POA: Insufficient documentation

## 2023-05-18 DIAGNOSIS — R1912 Hyperactive bowel sounds: Secondary | ICD-10-CM | POA: Insufficient documentation

## 2023-05-18 DIAGNOSIS — R1030 Lower abdominal pain, unspecified: Secondary | ICD-10-CM | POA: Diagnosis present

## 2023-05-18 DIAGNOSIS — R1012 Left upper quadrant pain: Secondary | ICD-10-CM | POA: Insufficient documentation

## 2023-05-18 LAB — FOOD ALLERGY PROFILE W/ REFLEXES
Allergen, Salmon, f41: <0.1 kU/L
Almonds: <0.1 kU/L
CLASS: 0
CLASS: 0
CLASS: 0
CLASS: 0
CLASS: 0
CLASS: 0
CLASS: 0
CLASS: 0
CLASS: 0
CLASS: 0
CLASS: 0
Cashew IgE: <0.1 kU/L
Class: 0
Class: 0
Class: 0
Egg White IgE: <0.1 kU/L
Fish Cod: <0.1 kU/L
Hazelnut: <0.1 kU/L
Milk IgE: <0.1 kU/L
Peanut IgE: <0.1 kU/L
Scallop IgE: <0.1 kU/L
Sesame Seed f10: <0.1 kU/L
Shrimp IgE: 0.1 kU/L — ABNORMAL HIGH
Soybean IgE: <0.1 kU/L
Tuna IgE: <0.1 kU/L
Walnut: <0.1 kU/L
Wheat IgE: <0.1 kU/L

## 2023-05-18 LAB — H. PYLORI BREATH TEST: H. pylori Breath Test: NOT DETECTED

## 2023-05-18 LAB — HCG, SERUM, QUALITATIVE: Preg, Serum: NEGATIVE

## 2023-05-18 LAB — INTERPRETATION:

## 2023-05-18 LAB — VALPROIC ACID LEVEL: Valproic Acid Lvl: 54 mg/L (ref 50.0–100.0)

## 2023-05-18 MED ORDER — IOHEXOL 350 MG/ML SOLN: 100.0000 mL | Freq: Once | INTRAVENOUS | Status: DC | PRN

## 2023-05-18 MED ORDER — IOHEXOL 300 MG/ML  SOLN
100.0000 mL | Freq: Once | INTRAMUSCULAR | Status: AC | PRN
  Administered 2023-05-18: 100 mL via INTRAVENOUS

## 2023-05-18 NOTE — Progress Notes (Signed)
 Good afternoon, mutual patient of ours. Had thirty pound weight gain in a month with abdominal pain. CT today showed Large cystic lesion with several septations above bladder and uterus. Probable ovarian mass, 16.3 by 12.8 by 17.0 cm.   Wanted to pass along to you, did already let her know and advised her to call your office for urgent eval. Thanks

## 2023-05-18 NOTE — Progress Notes (Signed)
 Calling pt.  Large cystic lesion with several septations above bladder and uterus. Could be ovarian mass. There is some atherosclerosis noted. Will work on cholesterol. There is a kidney stone on the left side however non obstructive. Stable 7 mm calcified left lower lung nodule, benign per radiology report. Did advise her will need urgent f/u with her gynecologist Dr. Alvia Awkward. She is going to call over to make appt and I will forward to her gynecologist as well.

## 2023-05-18 NOTE — Progress Notes (Signed)
 Please see depakote levels.

## 2023-05-20 NOTE — Progress Notes (Signed)
Office Visit Note  Patient: Sandra Montes             Date of Birth: 28-May-1970           MRN: [id number]             PCP: Mort Sawyers, FNP Referring: Mort Sawyers, FNP Visit Date: 06/03/2023 Occupation: @GUAROCC @  Subjective:  Pain in multiple joints  History of Present Illness: Sandra Montes is a 53 y.o. female seen in consultation per request of her PCP for the evaluation of joint pain and positive ANA.  According the patient she has had sprained her right ankle 4 times in her lifetime requiring physical therapy.  She has chronic discomfort in her right ankle joint.  She states 5 years ago she started having right knee joint pain for which she was seen at [facility] and had x-rays and was told that she had osteoarthritis.  She also had a cortisone injection.  As she recalls 2 years ago she climbed up the [employer] and had severe pain and discomfort in her right knee joint.  She went back to [facility] in June 2024 due to increased pain and swelling in her right knee at the time she had 75 mL of synovial fluid aspirated from her right knee joint.  She does not recall that it was tested.  She states she was referred to Dr. Mickel Duhamel, an orthopedic surgeon in [place] who sent her to physical therapy for about 6 weeks.  Patient had no improvement and had MRI which showed meniscal tear and Baker's cyst.  She was scheduled for arthroscopic surgery.  She states she gained about 40 pounds weight over the last 5 months and had a CT scan of her abdomen which showed 6 inches of left ovarian cyst per patient.  She is scheduled to see her OB/GYN for the evaluation and ultrasound.  Her knee joint surgery is postponed until the evaluation of ovarian cyst.  She states she also had right trochanteric pain for the last 1 year.  She gives history of discomfort in her bilateral shoulders more on the left than the right for the last 4 years.  She states the left shoulder pain radiates to her left  hand sometimes.  She is also noted some dots on her hands but her hands are not as painful.  She gives history of some discomfort in her cervical spine.  She had 2 episodes of plantar fasciitis in the past which improved after wearing proper fitting shoes.  There is no personal or family history of psoriasis.  There is family history of rheumatoid arthritis in paternal grandmother.  She is gravida 2, para 3, miscarriages 0.  There is no history of preeclampsia or DVTs.  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  She states she used to ride motorcycle and he is to go hiking but since her knee issues she is not able to be very active.  She walks minimally for exercise.    Activities of Daily Living:  Patient reports morning stiffness for all day.  Patient Reports nocturnal pain.  Difficulty dressing/grooming: Reports Difficulty climbing stairs: Reports Difficulty getting out of chair: Reports Difficulty using hands for taps, buttons, cutlery, and/or writing: Denies  Review of Systems  Constitutional:  Positive for fatigue.  HENT:  Positive for mouth dryness. Negative for mouth sores.   Eyes:  Positive for dryness.  Respiratory:  Positive for shortness of breath. Negative for difficulty breathing.  Cardiovascular:  Negative for chest pain and palpitations.  Gastrointestinal:  Negative for blood in stool, constipation and diarrhea.  Endocrine: Negative for increased urination.  Genitourinary:  Positive for involuntary urination.  Musculoskeletal:  Positive for joint pain, gait problem, joint pain, joint swelling, myalgias, morning stiffness, muscle tenderness and myalgias. Negative for muscle weakness.  Skin:  Positive for color change, hair loss and sensitivity to sunlight. Negative for rash.  Allergic/Immunologic: Positive for susceptible to infections.  Neurological:  Positive for headaches. Negative for dizziness.  Hematological:  Negative for swollen glands.   Psychiatric/Behavioral:  Positive for depressed mood. Negative for sleep disturbance. The patient is nervous/anxious.     PMFS History:  Patient Active Problem List   Diagnosis Date Noted   Tear of lateral collateral ligament of right knee 05/17/2023   Acute meniscal tear of knee, right, sequela 05/17/2023   Electrocardiogram showing T wave abnormalities 05/17/2023   DOE (dyspnea on exertion) 05/17/2023   Abnormal weight gain 05/17/2023   Abdominal distension 05/17/2023   Pre-op evaluation 05/17/2023   PTSD (post-traumatic stress disorder) 03/24/2023   High risk medication use 03/24/2023   History of alcohol abuse 03/24/2023   History of cannabis abuse 03/24/2023   Cessation of tobacco use in previous 12 months 02/11/2023   History of suicidal ideation 01/28/2023   Sleep disorder 12/30/2022   MDD (major depressive disorder), recurrent episode, severe (HCC) 12/17/2022   Obsessional thoughts 10/20/2022   Family history of cardiac arrest 09/07/2022   Erythrocytosis 01/30/2022   Elevated hemoglobin (HCC) 01/13/2022   History of colon polyps 01/13/2022   Hot flushes, perimenopausal 01/13/2022   Anxiety state 01/13/2022   Irritable bowel syndrome with diarrhea 10/13/2021   Centrilobular emphysema (HCC) 07/04/2021   Non-seasonal allergic rhinitis due to pollen 07/04/2021   Dyslipidemia 12/29/2018   Essential hypertension 12/29/2018    Past Medical History:  Diagnosis Date   Abnormal EKG    Emphysema lung (HCC)    HLD (hyperlipidemia)    Hypertension     Family History  Problem Relation Age of Onset   Depression Mother    Alcohol abuse Mother    Heart attack Mother 35   Skin cancer Father    Alcohol abuse Father    Prostate cancer Father    Rheum arthritis Paternal Grandmother    Prostate cancer Paternal Grandfather    Skin cancer Paternal Grandfather    Asthma Daughter    Mental illness Daughter        borderline   Mitral valve prolapse Daughter        & enlarged  aorta   Migraines Daughter    Alcohol abuse Cousin    Healthy Son    Healthy Son    Past Surgical History:  Procedure Laterality Date   CESAREAN SECTION     1993   CHOLECYSTECTOMY     1996   COLONOSCOPY WITH PROPOFOL N/A 11/18/2021   Procedure: COLONOSCOPY WITH PROPOFOL;  Surgeon: Toney Reil, MD;  Location: [facility] ENDOSCOPY;  Service: Gastroenterology;  Laterality: N/A;   TUBAL LIGATION     1995   Social History   Social History Narrative   Active smoker- lives in [place]; with mother- care giver; with quality/control- mortgage.    Immunization History  Administered Date(s) Administered   Tdap 10/17/2014     Objective: Vital Signs: BP (!) 141/94 (BP Location: Right Arm, Patient Position: Sitting, Cuff Size: Large)   Pulse 66   Resp 17   Ht 5' 6.5" (1.689  m)   Wt 230 lb 6.4 oz (104.5 kg)   BMI 36.63 kg/m    Physical Exam Vitals and nursing note reviewed.  Constitutional:      Appearance: She is well-developed.  HENT:     Head: Normocephalic and atraumatic.  Eyes:     Conjunctiva/sclera: Conjunctivae normal.  Cardiovascular:     Rate and Rhythm: Normal rate and regular rhythm.     Heart sounds: Normal heart sounds.  Pulmonary:     Effort: Pulmonary effort is normal.     Breath sounds: Normal breath sounds.  Abdominal:     General: Bowel sounds are normal.     Palpations: Abdomen is soft.  Musculoskeletal:     Cervical back: Normal range of motion.  Lymphadenopathy:     Cervical: No cervical adenopathy.  Skin:    General: Skin is warm and dry.     Capillary Refill: Capillary refill takes 2 to 3 seconds.     Comments: No sclerodactyly, telangiectasia or nailbed capillary changes were noted.  Neurological:     Mental Status: She is alert and oriented to person, place, and time.  Psychiatric:        Behavior: Behavior normal.      Musculoskeletal Exam: Cervical, thoracic and lumbar spine with good range of motion.  Shoulder joints were in good  range of motion with some discomfort on range of motion of the left shoulder.  Elbow joints, wrist joints, MCPs PIPs and DIPs been good range of motion.  She had bilateral PIP and DIP thickening with no synovitis.  Hip joints were in good range of motion without discomfort.  Right knee joint was warm swollen and with moderate effusion.  Left knee joint was in good range of motion without any warmth swelling or effusion.  There was no tenderness over ankles or MTPs.  Bilateral pes cavus was noted.  Calluses were noted in the metatarsals.  CDAI Exam: CDAI Score: -- Patient Global: --; Provider Global: -- Swollen: --; Tender: -- Joint Exam 06/03/2023   No joint exam has been documented for this visit   There is currently no information documented on the homunculus. Go to the Rheumatology activity and complete the homunculus joint exam.  Investigation: No additional findings.  Imaging: XR Shoulder Left Result Date: 06/03/2023 No glenohumeral joint space narrowing was noted.  Mild acromial spurring was noted.  No chondrocalcinosis was noted. Impression: These findings suggestive of early acromioclavicular degenerative changes.  XR Hand 2 View Left Result Date: 06/03/2023 CMC, PIP and DIP narrowing was noted.  No MCP, intercarpal or radiocarpal joint space narrowing was noted.  No erosive changes were noted. Impression: These findings are suggestive of osteoarthritis.  XR Hand 2 View Right Result Date: 06/03/2023 CMC, PIP and DIP narrowing was noted.  No MCP, intercarpal or radiocarpal joint space narrowing was noted.  No erosive changes were noted. Impression: These findings are suggestive of osteoarthritis.  ECHOCARDIOGRAM COMPLETE Result Date: 05/26/2023    ECHOCARDIOGRAM REPORT   Patient Name:   ADRIONA KANEY Date of Exam: 05/26/2023 Medical Rec #:  [id number]          Height:       67.0 in Accession #:    [id number]         Weight:       232.4 lb Date of Birth:  1970/07/14         BSA:           2.155 m Patient Age:  52 years           BP:           122/86 mmHg Patient Gender: F                  HR:           68 bpm. Exam Location:  [address] Procedure: 3D Echo, 2D Echo, Color Doppler, Cardiac Doppler and Strain Analysis Indications:    Dyspnea on exertion R06.09; R94.31 Abnormal EKG  History:        Patient has no prior history of Echocardiogram examinations.                 Risk Factors:Hypertension and Dyslipidemia.  Sonographer:    Thurman Coyer RDCS Referring Phys: [phone] PHILIP J NAHSER IMPRESSIONS  1. Left ventricular ejection fraction, by estimation, is 55 to 60%. The left ventricle has normal function. The left ventricle has no regional wall motion abnormalities. Left ventricular diastolic parameters are consistent with Grade I diastolic dysfunction (impaired relaxation). The average left ventricular global longitudinal strain is -20.7 %. The global longitudinal strain is normal.  2. Right ventricular systolic function is normal. The right ventricular size is normal.  3. The mitral valve is normal in structure. Trivial mitral valve regurgitation. No evidence of mitral stenosis.  4. The aortic valve is tricuspid. There is mild calcification of the aortic valve. Aortic valve regurgitation is trivial. Aortic valve sclerosis/calcification is present, without any evidence of aortic stenosis.  5. The inferior vena cava is normal in size with greater than 50% respiratory variability, suggesting right atrial pressure of 3 mmHg. FINDINGS  Left Ventricle: Left ventricular ejection fraction, by estimation, is 55 to 60%. The left ventricle has normal function. The left ventricle has no regional wall motion abnormalities. The average left ventricular global longitudinal strain is -20.7 %. The global longitudinal strain is normal. The left ventricular internal cavity size was normal in size. There is no left ventricular hypertrophy. Left ventricular diastolic parameters are consistent with Grade I  diastolic dysfunction (impaired relaxation). Right Ventricle: The right ventricular size is normal. No increase in right ventricular wall thickness. Right ventricular systolic function is normal. Left Atrium: Left atrial size was normal in size. Right Atrium: Right atrial size was normal in size. Pericardium: There is no evidence of pericardial effusion. Mitral Valve: The mitral valve is normal in structure. Trivial mitral valve regurgitation. No evidence of mitral valve stenosis. Tricuspid Valve: The tricuspid valve is normal in structure. Tricuspid valve regurgitation is trivial. No evidence of tricuspid stenosis. Aortic Valve: The aortic valve is tricuspid. There is mild calcification of the aortic valve. Aortic valve regurgitation is trivial. Aortic valve sclerosis/calcification is present, without any evidence of aortic stenosis. Pulmonic Valve: The pulmonic valve was normal in structure. Pulmonic valve regurgitation is trivial. No evidence of pulmonic stenosis. Aorta: The aortic root is normal in size and structure. Venous: The inferior vena cava is normal in size with greater than 50% respiratory variability, suggesting right atrial pressure of 3 mmHg. IAS/Shunts: No atrial level shunt detected by color flow Doppler.  LEFT VENTRICLE PLAX 2D LVIDd:         4.60 cm   Diastology LVIDs:         3.20 cm   LV e' medial:    5.17 cm/s LV PW:         1.10 cm   LV E/e' medial:  15.6 LV IVS:  1.10 cm   LV e' lateral:   8.50 cm/s LVOT diam:     2.40 cm   LV E/e' lateral: 9.5 LV SV:         103 LV SV Index:   48        2D Longitudinal Strain LVOT Area:     4.52 cm  2D Strain GLS Avg:     -20.7 %                           3D Volume EF:                          3D EF:        61 %                          LV EDV:       156 ml                          LV ESV:       60 ml                          LV SV:        96 ml RIGHT VENTRICLE            IVC RV Basal diam:  3.00 cm    IVC diam: 1.70 cm RV Mid diam:    3.10 cm RV S  prime:     8.85 cm/s TAPSE (M-mode): 1.5 cm LEFT ATRIUM             Index        RIGHT ATRIUM           Index LA diam:        3.40 cm 1.58 cm/m   RA Area:     12.60 cm LA Vol (A2C):   58.5 ml 27.14 ml/m  RA Volume:   23.20 ml  10.76 ml/m LA Vol (A4C):   45.5 ml 21.11 ml/m LA Biplane Vol: 53.3 ml 24.73 ml/m  AORTIC VALVE LVOT Vmax:   108.00 cm/s LVOT Vmean:  68.000 cm/s LVOT VTI:    0.227 m  AORTA Ao Root diam: 3.40 cm Ao Asc diam:  3.70 cm MITRAL VALVE MV Area (PHT): 3.27 cm     SHUNTS MV Decel Time: 232 msec     Systemic VTI:  0.23 m MV E velocity: 80.70 cm/s   Systemic Diam: 2.40 cm MV A velocity: 111.00 cm/s MV E/A ratio:  0.73 Arvilla Meres MD Electronically signed by Arvilla Meres MD Signature Date/Time: 05/26/2023/12:41:13 PM    Final    CT ABDOMEN PELVIS W CONTRAST Result Date: 05/18/2023 CLINICAL DATA:  Abdominal pain. EXAM: CT ABDOMEN AND PELVIS WITH CONTRAST TECHNIQUE: Multidetector CT imaging of the abdomen and pelvis was performed using the standard protocol following bolus administration of intravenous contrast. RADIATION DOSE REDUCTION: This exam was performed according to the departmental dose-optimization program which includes automated exposure control, adjustment of the mA and/or kV according to patient size and/or use of iterative reconstruction technique. CONTRAST:  OMNIPAQUE IOHEXOL 300 MG/ML  SOLN COMPARISON:  Chest CT going back to 06/05/2020. No abdomen pelvis CT scan. FINDINGS: Lower chest: Stable 7 mm centrally calcified left lower lobe lung nodule, benign. Slight dependent atelectasis. No  pleural effusion. Coronary artery calcifications are seen. Hepatobiliary: No focal liver abnormality is seen. Status post cholecystectomy. No biliary dilatation. Patent portal vein. Pancreas: Unremarkable. No pancreatic ductal dilatation or surrounding inflammatory changes. Spleen: Normal in size without focal abnormality. Adrenals/Urinary Tract: Adrenal glands are preserved. No  enhancing renal mass or collecting system dilatation. There is a punctate midportion left-sided nonobstructing renal stone. Benign Bosniak 1 and 2 renal cysts identified. Largest is seen in the upper pole of the left measuring 3 cm. Hounsfield unit of 4. No imaging follow-up of these lesions. Preserved contours of the urinary bladder. Stomach/Bowel: Large bowel has a normal course and caliber with scattered colonic stool. Normal appendix. Small bowel is nondilated. Mild debris in the stomach. Vascular/Lymphatic: Normal caliber aorta and IVC. Mild atherosclerotic changes along the aorta and iliac vessels. There are some small less than 1 cm in size in short axis retroperitoneal nodes seen, nonpathologic by size criteria. Reproductive: Uterus is present. There is a large cystic lesion identified with several septations centered just above the bladder and uterus. This measures 16.3 by 12.8 by 17.0 cm. This has abuts several structures but based on appearance this very well could be an ovarian mass. Other: No ascites. Musculoskeletal: Slight curvature of the spine with scattered degenerative changes. IMPRESSION: Large complex cystic lesion centered just above the uterus in the central pelvis measuring 17.0 x 16.3 x 12.8 cm. Although this has touching several structures this very well could be an ovarian cystic neoplasm. Recommend surgical consultation. No ascites, free air or nodal enlargement. No bowel obstruction.  Normal appendix. Punctate nonobstructing left-sided renal stone Electronically Signed   By: Karen Kays M.D.   On: 05/18/2023 15:22   DG Chest 2 View Result Date: 05/17/2023 CLINICAL DATA:  Preop chest radiograph. EXAM: CHEST - 2 VIEW COMPARISON:  Chest CT dated 09/07/2022. FINDINGS: Mild chronic bronchitic changes. No focal consolidation, pleural effusion, or pneumothorax. The cardiac silhouette is within normal limits. No acute osseous pathology. IMPRESSION: No active cardiopulmonary disease.  Electronically Signed   By: Elgie Collard M.D.   On: 05/17/2023 14:41    Recent Labs: Lab Results  Component Value Date   WBC 6.5 05/17/2023   HGB 15.0 05/17/2023   PLT 171.0 05/17/2023   NA 140 05/17/2023   K 3.8 05/17/2023   CL 97 05/17/2023   CO2 36 (H) 05/17/2023   GLUCOSE 92 05/17/2023   BUN 10 05/17/2023   CREATININE 0.62 05/17/2023   BILITOT 0.5 05/17/2023   ALKPHOS 49 05/17/2023   AST 14 05/17/2023   ALT 11 05/17/2023   PROT 6.9 05/17/2023   ALBUMIN 4.3 05/17/2023   CALCIUM 9.1 05/17/2023   October 20, 2022 ANA 1: 80 NH, 1: 80 nucleolar, RF negative, sed rate 7 December 16, 2022 TSH normal, CK 61  Speciality Comments: No specialty comments available.  Procedures:  No procedures performed Allergies: Buspar [buspirone], Lisinopril, Morphine, and Sulfa antibiotics   Assessment / Plan:     Visit Diagnoses: Polyarthralgia -patient complains of pain and discomfort in multiple joints.  She complains of discomfort in the left shoulder, bilateral hands, right knee and her right ankle.  She has history of injury to some of those joints in the past.  Plan: Cyclic citrul peptide antibody, IgG  Chronic left shoulder pain -she complains of discomfort in the bilateral shoulders more prominent in her left shoulder joint.  She had discomfort range of motion of her left shoulder joint which was in full range of motion.  She had tenderness over subacromial region.  No warmth effusion was noted.  Plan: XR Shoulder Left.  X-rays show early acromioclavicular degenerative changes.  A handout on shoulder joint exercises was given.  Pain in both hands -she complains of a stiffness in her hands.  Bilateral PIP and DIP thickening with no synovitis was noted.  Plan: XR Hand 2 View Right, XR Hand 2 View Left.  X-rays were suggestive of osteoarthritis.  A handout on hand exercises was given.  Other old tear of meniscus of right knee, unspecified meniscus-she has been having pain and discomfort in her  right knee joint for the last 5 years.  She has been going to [facility] for several years and had 75 mL of synovial fluid aspirated which was not analyzed per patient in June 2024.  She has been under care of Dr.Caren in [place].  She had physical therapy and then MRI which showed meniscal tear.  She was scheduled for arthroscopic surgery which was postponed due to recent finding of ovarian cyst.  She informed swelling and effusion on the examination today.  Left knee joint was in good range of motion without any warmth swelling or effusion.  Positive ANA (antinuclear antibody) -she was found to have low titer positive ANA.  She gives history of dry mouth, dry eyes, Raynauds, hair loss and photosensitivity.  She is a smoker.  I will obtain additional labs today.  Plan: Protein / creatinine ratio, urine, ANA, Anti-scleroderma antibody, RNP Antibody, Anti-Smith antibody, Sjogrens syndrome-A extractable nuclear antibody, Sjogrens syndrome-B extractable nuclear antibody, Anti-DNA antibody, double-stranded, C3 and C4, Beta-2 glycoprotein antibodies, Cardiolipin antibodies, IgG, IgM, IgA, Lupus Anticoagulant Eval w/Reflex  Raynaud's disease without gangrene -she gives history of Raynaud's phenomenon and cold hands and feet for many years.  She had decreased capillary refill without any nailbed capillary changes or telangiectasia.  No sclerodactyly was noted.  Most likely symptoms are related to history of smoking.  She is on metoprolol which can worsen the symptoms of her Raynauds.  Plan: Beta-2 glycoprotein antibodies, Cardiolipin antibodies, IgG, IgM, IgA, Lupus Anticoagulant Eval w/Reflex  Dyslipidemia-she is on Lipitor.  Essential hypertension-she is on Toprol, HCTZ and Avapro.  Irritable bowel syndrome with diarrhea - in remission  History of colon polyps  Non-seasonal allergic rhinitis due to pollen  Sleep disorder - Better on trazodone.  PTSD (post-traumatic stress disorder)  Anxiety and  depression  History of suicidal ideation  History of alcohol abuse - quit alcohol in 2018  History of cannabis abuse - occasional use  Former smoker - 1-1/2 pack/day for 38 years.  She quit smoking 10/2022.  Family history of rheumatoid arthritis-paternal grandmother  Orders: Orders Placed This Encounter  Procedures   XR Hand 2 View Right   XR Hand 2 View Left   XR Shoulder Left   Protein / creatinine ratio, urine   ANA   Anti-scleroderma antibody   RNP Antibody   Anti-Smith antibody   Sjogrens syndrome-A extractable nuclear antibody   Sjogrens syndrome-B extractable nuclear antibody   Anti-DNA antibody, double-stranded   C3 and C4   Beta-2 glycoprotein antibodies   Cardiolipin antibodies, IgG, IgM, IgA   Lupus Anticoagulant Eval w/Reflex   Cyclic citrul peptide antibody, IgG   No orders of the defined types were placed in this encounter.    Follow-Up Instructions: Return for Polyarthralgia, positive ANA.   Pollyann Savoy, MD  Note - This record has been created using Animal nutritionist.  Chart creation errors have been sought, but  may not always  have been located. Such creation errors do not reflect on  the standard of medical care.

## 2023-05-22 ENCOUNTER — Encounter: Payer: Self-pay | Admitting: Cardiovascular Disease

## 2023-05-22 NOTE — Progress Notes (Signed)
  Cardiology Office Note:  .   Date:  05/25/2023  ID:  Sandra Montes, DOB [birth year], MRN [id number] PCP: Mort Sawyers, FNP  [facility] Providers Cardiologist:  None    History of Present Illness: .   Sandra Montes is a 53 y.o. female who was found to have some ECG abnormalities and shortness of breath  Hx of HTN  Her ECG from May 17, 2023 shows a nonspecific ST abnormality  Is having some dyspnea  Has a 6 inch orvairan cyst  Will need her ovarian cyst removed  Has gained 40 lbs in the last 4 months ( since she was started on Depacote )  Avoids salt   Has a right knee tear ( torn meniscus )    She was very active prior to her knee injury    + premature CAD  Mother - MI at age 33, now has had CABG . Mat. GF , maternal great aunt had CAD   She stopped smoking in November 01, 2022 Smoked for 38 years  Has mild COPD       ROS:   Studies Reviewed: Marland Kitchen        ECG from 12/16/22 :   Sinus bradycardia at 58 beats a minute.  She has nonspecific T wave inversions in anterolateral leads.  She has incomplete right bundle branch block Risk Assessment/Calculations:             Physical Exam:   VS:  BP 122/86   Pulse 69   Ht 5\' 7"  (1.702 m)   Wt 232 lb 6.4 oz (105.4 kg)   SpO2 94%   BMI 36.40 kg/m    Wt Readings from Last 3 Encounters:  05/25/23 232 lb 6.4 oz (105.4 kg)  05/17/23 230 lb (104.3 kg)  04/20/23 230 lb (104.3 kg)    GEN: Well nourished, well developed in no acute distress NECK: No JVD; No carotid bruits CARDIAC: RRR, no murmurs, rubs, gallops RESPIRATORY:  Clear to auscultation without rales, wheezing or rhonchi  ABDOMEN: Soft, non-tender, non-distended EXTREMITIES:  No edema; No deformity   ASSESSMENT AND PLAN: .   1.  Abnormal EKG: She has an incomplete right bundle branch block with T wave inversions in the anterolateral leads.  Will get an echocardiogram to assess her LV function.  She is not having any episodes of angina.  She does  have some shortness of breath but this could be due to deconditioning and her recent 40 pound weight gain.  She needs preoperative clearance for ovarian surgery.  If her echocardiogram is unremarkable we should be able to clear her fairly easily for her ovarian surgery.  2.  History of hyperlipidemia: She also has a strong family history of premature coronary artery disease.  Will get a coronary calcium score for further evaluation.  She is currently on atorvastatin.  Her last LDL is 84.   Will check LP(a) in 2 weeks   3.  Hypertension: She is currently on hydrochlorothiazide and irbesartan.  Blood pressure is well-controlled.  She is hypokalemic.  Will add potassium chloride and recheck levels in several weeks.    3-4 months   Dispo:    Signed, Kristeen Miss, MD

## 2023-05-25 ENCOUNTER — Ambulatory Visit: Payer: 59 | Attending: Cardiovascular Disease | Admitting: Cardiovascular Disease

## 2023-05-25 ENCOUNTER — Encounter: Payer: Self-pay | Admitting: Cardiovascular Disease

## 2023-05-25 VITALS — BP 122/86 | HR 69 | Ht 67.0 in | Wt 232.4 lb

## 2023-05-25 DIAGNOSIS — R0609 Other forms of dyspnea: Secondary | ICD-10-CM | POA: Diagnosis not present

## 2023-05-25 DIAGNOSIS — R9431 Abnormal electrocardiogram [ECG] [EKG]: Secondary | ICD-10-CM | POA: Diagnosis not present

## 2023-05-25 DIAGNOSIS — Z8249 Family history of ischemic heart disease and other diseases of the circulatory system: Secondary | ICD-10-CM

## 2023-05-25 MED ORDER — POTASSIUM CHLORIDE ER 10 MEQ PO TBCR: 10.0000 meq | EXTENDED_RELEASE_TABLET | Freq: Two times a day (BID) | ORAL | 3 refills | Status: DC

## 2023-05-25 NOTE — Patient Instructions (Signed)
Medication Instructions:  START Potassium Chloride twice daily *If you need a refill on your cardiac medications before your next appointment, please call your pharmacy*  Lab Work: BMET, Lipoprotein(a) in 2 weeks If you have labs (blood work) drawn today and your tests are completely normal, you will receive your results only by: MyChart Message (if you have MyChart) OR A paper copy in the mail If you have any lab test that is abnormal or we need to change your treatment, we will call you to review the results.  Testing/Procedures: ECHO Your physician has requested that you have an echocardiogram. Echocardiography is a painless test that uses sound waves to create images of your heart. It provides your doctor with information about the size and shape of your heart and how well your heart's chambers and valves are working. This procedure takes approximately one hour. There are no restrictions for this procedure. Please do NOT wear cologne, perfume, aftershave, or lotions (deodorant is allowed). Please arrive 15 minutes prior to your appointment time.  Please note: We ask at that you not bring children with you during ultrasound (echo/ vascular) testing. Due to room size and safety concerns, children are not allowed in the ultrasound rooms during exams. Our front office staff cannot provide observation of children in our lobby area while testing is being conducted. An adult accompanying a patient to their appointment will only be allowed in the ultrasound room at the discretion of the ultrasound technician under special circumstances. We apologize for any inconvenience.  Coronary Calcium Score CT Non-Cardiac CT scanning, (CAT scanning), is a noninvasive, special x-ray that produces cross-sectional images of the body using x-rays and a computer. CT scans help physicians diagnose and treat medical conditions. For some CT exams, a contrast material is used to enhance visibility in the area of  the body being studied. CT scans provide greater clarity and reveal more details than regular x-ray exams.  Follow-Up: At [facility], you and your health needs are our priority.  As part of our continuing mission to provide you with exceptional heart care, we have created designated Provider Care Teams.  These Care Teams include your primary Cardiologist (physician) and Advanced Practice Providers (APPs -  Physician Assistants and Nurse Practitioners) who all work together to provide you with the care you need, when you need it.  Your next appointment:   4 month(s)  Provider:   Kristeen Miss, MD     1st Floor: - Lobby - Registration  - Pharmacy  - Lab - Cafe  2nd Floor: - PV Lab - Diagnostic Testing (echo, CT, nuclear med)  3rd Floor: - Vacant  4th Floor: - TCTS (cardiothoracic surgery) - AFib Clinic - Structural Heart Clinic - Vascular Surgery  - Vascular Ultrasound  5th Floor: - HeartCare Cardiology (general and EP) - Clinical Pharmacy for coumadin, hypertension, lipid, weight-loss medications, and med management appointments    Valet parking services will be available as well.

## 2023-05-26 ENCOUNTER — Ambulatory Visit (HOSPITAL_COMMUNITY): Payer: 59 | Attending: Cardiovascular Disease

## 2023-05-26 ENCOUNTER — Encounter: Payer: Self-pay | Admitting: Cardiovascular Disease

## 2023-05-26 DIAGNOSIS — R9431 Abnormal electrocardiogram [ECG] [EKG]: Secondary | ICD-10-CM | POA: Insufficient documentation

## 2023-05-26 DIAGNOSIS — Z8249 Family history of ischemic heart disease and other diseases of the circulatory system: Secondary | ICD-10-CM | POA: Diagnosis not present

## 2023-05-26 DIAGNOSIS — I5189 Other ill-defined heart diseases: Secondary | ICD-10-CM

## 2023-05-26 DIAGNOSIS — R0609 Other forms of dyspnea: Secondary | ICD-10-CM | POA: Diagnosis not present

## 2023-05-26 HISTORY — DX: Other ill-defined heart diseases: I51.89

## 2023-05-26 LAB — ECHOCARDIOGRAM COMPLETE
Area-P 1/2: 3.27 cm2
S' Lateral: 3.2 cm

## 2023-05-26 NOTE — Progress Notes (Signed)
Noted  

## 2023-05-31 ENCOUNTER — Ambulatory Visit
Admission: RE | Admit: 2023-05-31 | Discharge: 2023-05-31 | Disposition: A | Discharge status: Home / Self Care | Payer: Self-pay | Source: Ambulatory Visit | Attending: Cardiovascular Disease | Admitting: Cardiovascular Disease

## 2023-05-31 DIAGNOSIS — Z8249 Family history of ischemic heart disease and other diseases of the circulatory system: Secondary | ICD-10-CM

## 2023-05-31 DIAGNOSIS — R9431 Abnormal electrocardiogram [ECG] [EKG]: Secondary | ICD-10-CM

## 2023-05-31 DIAGNOSIS — R0609 Other forms of dyspnea: Secondary | ICD-10-CM

## 2023-06-03 ENCOUNTER — Ambulatory Visit: Payer: 59 | Attending: Rheumatology | Admitting: Rheumatology

## 2023-06-03 ENCOUNTER — Ambulatory Visit (INDEPENDENT_AMBULATORY_CARE_PROVIDER_SITE_OTHER): Payer: 59

## 2023-06-03 ENCOUNTER — Encounter: Payer: Self-pay | Admitting: Rheumatology

## 2023-06-03 VITALS — BP 141/94 | HR 66 | Resp 17 | Ht 66.5 in | Wt 230.4 lb

## 2023-06-03 DIAGNOSIS — G8929 Other chronic pain: Secondary | ICD-10-CM

## 2023-06-03 DIAGNOSIS — M23206 Derangement of unspecified meniscus due to old tear or injury, right knee: Secondary | ICD-10-CM

## 2023-06-03 DIAGNOSIS — M79642 Pain in left hand: Secondary | ICD-10-CM

## 2023-06-03 DIAGNOSIS — M79641 Pain in right hand: Secondary | ICD-10-CM

## 2023-06-03 DIAGNOSIS — E785 Hyperlipidemia, unspecified: Secondary | ICD-10-CM

## 2023-06-03 DIAGNOSIS — I10 Essential (primary) hypertension: Secondary | ICD-10-CM

## 2023-06-03 DIAGNOSIS — F419 Anxiety disorder, unspecified: Secondary | ICD-10-CM

## 2023-06-03 DIAGNOSIS — Z8261 Family history of arthritis: Secondary | ICD-10-CM

## 2023-06-03 DIAGNOSIS — F1011 Alcohol abuse, in remission: Secondary | ICD-10-CM

## 2023-06-03 DIAGNOSIS — M25512 Pain in left shoulder: Secondary | ICD-10-CM

## 2023-06-03 DIAGNOSIS — Z87891 Personal history of nicotine dependence: Secondary | ICD-10-CM

## 2023-06-03 DIAGNOSIS — M2550 Pain in unspecified joint: Secondary | ICD-10-CM

## 2023-06-03 DIAGNOSIS — J301 Allergic rhinitis due to pollen: Secondary | ICD-10-CM

## 2023-06-03 DIAGNOSIS — G479 Sleep disorder, unspecified: Secondary | ICD-10-CM

## 2023-06-03 DIAGNOSIS — F32A Depression, unspecified: Secondary | ICD-10-CM

## 2023-06-03 DIAGNOSIS — Z8659 Personal history of other mental and behavioral disorders: Secondary | ICD-10-CM

## 2023-06-03 DIAGNOSIS — Z860100 Personal history of colon polyps, unspecified: Secondary | ICD-10-CM

## 2023-06-03 DIAGNOSIS — S83206S Unspecified tear of unspecified meniscus, current injury, right knee, sequela: Secondary | ICD-10-CM

## 2023-06-03 DIAGNOSIS — R768 Other specified abnormal immunological findings in serum: Secondary | ICD-10-CM

## 2023-06-03 DIAGNOSIS — K580 Irritable bowel syndrome with diarrhea: Secondary | ICD-10-CM

## 2023-06-03 DIAGNOSIS — F4310 Post-traumatic stress disorder, unspecified: Secondary | ICD-10-CM

## 2023-06-03 DIAGNOSIS — I7300 Raynaud's syndrome without gangrene: Secondary | ICD-10-CM

## 2023-06-03 DIAGNOSIS — F1211 Cannabis abuse, in remission: Secondary | ICD-10-CM

## 2023-06-03 NOTE — Patient Instructions (Addendum)
If you have right knee joint aspirated please request synovial fluid analysis for cell count and crystal examination  Shoulder Exercises Ask your health care provider which exercises are safe for you. Do exercises exactly as told by your health care provider and adjust them as directed. It is normal to feel mild stretching, pulling, tightness, or discomfort as you do these exercises. Stop right away if you feel sudden pain or your pain gets worse. Do not begin these exercises until told by your health care provider. Stretching exercises External rotation and abduction This exercise is sometimes called corner stretch. The exercise rotates your arm outward (external rotation) and moves your arm out from your body (abduction). Stand in a doorway with one of your feet slightly in front of the other. This is called a staggered stance. If you cannot reach your forearms to the door frame, stand facing a corner of a room. Choose one of the following positions as told by your health care provider: Place your hands and forearms on the door frame above your head. Place your hands and forearms on the door frame at the height of your head. Place your hands on the door frame at the height of your elbows. Slowly move your weight onto your front foot until you feel a stretch across your chest and in the front of your shoulders. Keep your head and chest upright and keep your abdominal muscles tight. Hold for __________ seconds. To release the stretch, shift your weight to your back foot. Repeat __________ times. Complete this exercise __________ times a day. Extension, standing  Stand and hold a broomstick, a cane, or a similar object behind your back. Your hands should be a little wider than shoulder-width apart. Your palms should face away from your back. Keeping your elbows straight and your shoulder muscles relaxed, move the stick away from your body until you feel a stretch in your shoulders  (extension). Avoid shrugging your shoulders while you move the stick. Keep your shoulder blades tucked down toward the middle of your back. Hold for __________ seconds. Slowly return to the starting position. Repeat __________ times. Complete this exercise __________ times a day. Range-of-motion exercises Pendulum  Stand near a wall or a surface that you can hold onto for balance. Bend at the waist and let your left / right arm hang straight down. Use your other arm to support you. Keep your back straight and do not lock your knees. Relax your left / right arm and shoulder muscles, and move your hips and your trunk so your left / right arm swings freely. Your arm should swing because of the motion of your body, not because you are using your arm or shoulder muscles. Keep moving your hips and trunk so your arm swings in the following directions, as told by your health care provider: Side to side. Forward and backward. In clockwise and counterclockwise circles. Continue each motion for __________ seconds, or for as long as told by your health care provider. Slowly return to the starting position. Repeat __________ times. Complete this exercise __________ times a day. Shoulder flexion, standing  Stand and hold a broomstick, a cane, or a similar object. Place your hands a little more than shoulder-width apart on the object. Your left / right hand should be palm-up, and your other hand should be palm-down. Keep your elbow straight and your shoulder muscles relaxed. Push the stick up with your healthy arm to raise your left / right arm in front of your  body, and then over your head until you feel a stretch in your shoulder (flexion). Avoid shrugging your shoulder while you raise your arm. Keep your shoulder blade tucked down toward the middle of your back. Hold for __________ seconds. Slowly return to the starting position. Repeat __________ times. Complete this exercise __________ times a  day. Shoulder abduction, standing  Stand and hold a broomstick, a cane, or a similar object. Place your hands a little more than shoulder-width apart on the object. Your left / right hand should be palm-up, and your other hand should be palm-down. Keep your elbow straight and your shoulder muscles relaxed. Push the object across your body toward your left / right side. Raise your left / right arm to the side of your body (abduction) until you feel a stretch in your shoulder. Do not raise your arm above shoulder height unless your health care provider tells you to do that. If directed, raise your arm over your head. Avoid shrugging your shoulder while you raise your arm. Keep your shoulder blade tucked down toward the middle of your back. Hold for __________ seconds. Slowly return to the starting position. Repeat __________ times. Complete this exercise __________ times a day. Internal rotation  Place your left / right hand behind your back, palm-up. Use your other hand to dangle an exercise band, a broomstick, or a similar object over your shoulder. Grasp the band with your left / right hand so you are holding on to both ends. Gently pull up on the band until you feel a stretch in the front of your left / right shoulder. The movement of your arm toward the center of your body is called internal rotation. Avoid shrugging your shoulder while you raise your arm. Keep your shoulder blade tucked down toward the middle of your back. Hold for __________ seconds. Release the stretch by letting go of the band and lowering your hands. Repeat __________ times. Complete this exercise __________ times a day. Strengthening exercises External rotation  Sit in a stable chair without armrests. Secure an exercise band to a stable object at elbow height on your left / right side. Place a soft object, such as a folded towel or a small pillow, between your left / right upper arm and your body to move your elbow  about 4 inches (10 cm) away from your side. Hold the end of the exercise band so it is tight and there is no slack. Keeping your elbow pressed against the soft object, slowly move your forearm out, away from your abdomen (external rotation). Keep your body steady so only your forearm moves. Hold for __________ seconds. Slowly return to the starting position. Repeat __________ times. Complete this exercise __________ times a day. Shoulder abduction  Sit in a stable chair without armrests, or stand up. Hold a __________ lb / kg weight in your left / right hand, or hold an exercise band with both hands. Start with your arms straight down and your left / right palm facing in, toward your body. Slowly lift your left / right hand out to your side (abduction). Do not lift your hand above shoulder height unless your health care provider tells you that this is safe. Keep your arms straight. Avoid shrugging your shoulder while you do this movement. Keep your shoulder blade tucked down toward the middle of your back. Hold for __________ seconds. Slowly lower your arm, and return to the starting position. Repeat __________ times. Complete this exercise __________ times a day.  Shoulder extension  Sit in a stable chair without armrests, or stand up. Secure an exercise band to a stable object in front of you so it is at shoulder height. Hold one end of the exercise band in each hand. Straighten your elbows and lift your hands up to shoulder height. Squeeze your shoulder blades together as you pull your hands down to the sides of your thighs (extension). Stop when your hands are straight down by your sides. Do not let your hands go behind your body. Hold for __________ seconds. Slowly return to the starting position. Repeat __________ times. Complete this exercise __________ times a day. Shoulder row  Sit in a stable chair without armrests, or stand up. Secure an exercise band to a stable object in  front of you so it is at chest height. Hold one end of the exercise band in each hand. Position your palms so that your thumbs are facing the ceiling (neutral position). Bend each of your elbows to a 90-degree angle (right angle) and keep your upper arms at your sides. Step back or move the chair back until the band is tight and there is no slack. Slowly pull your elbows back behind you. Hold for __________ seconds. Slowly return to the starting position. Repeat __________ times. Complete this exercise __________ times a day. Shoulder press-ups  Sit in a stable chair that has armrests. Sit upright, with your feet flat on the floor. Put your hands on the armrests so your elbows are bent and your fingers are pointing forward. Your hands should be about even with the sides of your body. Push down on the armrests and use your arms to lift yourself off the chair. Straighten your elbows and lift yourself up as much as you comfortably can. Move your shoulder blades down, and avoid letting your shoulders move up toward your ears. Keep your feet on the ground. As you get stronger, your feet should support less of your body weight as you lift yourself up. Hold for __________ seconds. Slowly lower yourself back into the chair. Repeat __________ times. Complete this exercise __________ times a day. Wall push-ups  Stand so you are facing a stable wall. Your feet should be about one arm-length away from the wall. Lean forward and place your palms on the wall at shoulder height. Keep your feet flat on the floor as you bend your elbows and lean forward toward the wall. Hold for __________ seconds. Straighten your elbows to push yourself back to the starting position. Repeat __________ times. Complete this exercise __________ times a day. This information is not intended to replace advice given to you by your health care provider. Make sure you discuss any questions you have with your health care  provider. Document Revised: 06/10/2021 Document Reviewed: 06/10/2021 Elsevier Patient Education  2024 [employer]  Hand Exercises Hand exercises can be helpful for almost anyone. They can strengthen your hands and improve flexibility and movement. The exercises can also increase blood flow to the hands. These results can make your work and daily tasks easier for you. Hand exercises can be especially helpful for people who have joint pain from arthritis or nerve damage from using their hands over and over. These exercises can also help people who injure a hand. Exercises Most of these hand exercises are gentle stretching and motion exercises. It is usually safe to do them often throughout the day. Warming up your hands before exercise may help reduce stiffness. You can do this with gentle  massage or by placing your hands in warm water for 10-15 minutes. It is normal to feel some stretching, pulling, tightness, or mild discomfort when you begin new exercises. In time, this will improve. Remember to always be careful and stop right away if you feel sudden, very bad pain or your pain gets worse. You want to get better and be safe. Ask your health care provider which exercises are safe for you. Do exercises exactly as told by your provider and adjust them as told. Do not begin these exercises until told by your provider. Knuckle bend or "claw" fist  Stand or sit with your arm, hand, and all five fingers pointed straight up. Make sure to keep your wrist straight. Gently bend your fingers down toward your palm until the tips of your fingers are touching your palm. Keep your big knuckle straight and only bend the small knuckles in your fingers. Hold this position for 10 seconds. Straighten your fingers back to your starting position. Repeat this exercise 5-10 times with each hand. Full finger fist  Stand or sit with your arm, hand, and all five fingers pointed straight up. Make sure to keep your  wrist straight. Gently bend your fingers into your palm until the tips of your fingers are touching the middle of your palm. Hold this position for 10 seconds. Extend your fingers back to your starting position, stretching every joint fully. Repeat this exercise 5-10 times with each hand. Straight fist  Stand or sit with your arm, hand, and all five fingers pointed straight up. Make sure to keep your wrist straight. Gently bend your fingers at the big knuckle, where your fingers meet your hand, and at the middle knuckle. Keep the knuckle at the tips of your fingers straight and try to touch the bottom of your palm. Hold this position for 10 seconds. Extend your fingers back to your starting position, stretching every joint fully. Repeat this exercise 5-10 times with each hand. Tabletop  Stand or sit with your arm, hand, and all five fingers pointed straight up. Make sure to keep your wrist straight. Gently bend your fingers at the big knuckle, where your fingers meet your hand, as far down as you can. Keep the small knuckles in your fingers straight. Think of forming a tabletop with your fingers. Hold this position for 10 seconds. Extend your fingers back to your starting position, stretching every joint fully. Repeat this exercise 5-10 times with each hand. Finger spread  Place your hand flat on a table with your palm facing down. Make sure your wrist stays straight. Spread your fingers and thumb apart from each other as far as you can until you feel a gentle stretch. Hold this position for 10 seconds. Bring your fingers and thumb tight together again. Hold this position for 10 seconds. Repeat this exercise 5-10 times with each hand. Making circles  Stand or sit with your arm, hand, and all five fingers pointed straight up. Make sure to keep your wrist straight. Make a circle by touching the tip of your thumb to the tip of your index finger. Hold for 10 seconds. Then open your hand  wide. Repeat this motion with your thumb and each of your fingers. Repeat this exercise 5-10 times with each hand. Thumb motion  Sit with your forearm resting on a table and your wrist straight. Your thumb should be facing up toward the ceiling. Keep your fingers relaxed as you move your thumb. Lift your thumb up as  high as you can toward the ceiling. Hold for 10 seconds. Bend your thumb across your palm as far as you can, reaching the tip of your thumb for the small finger (pinkie) side of your palm. Hold for 10 seconds. Repeat this exercise 5-10 times with each hand. Grip strengthening  Hold a stress ball or other soft ball in the middle of your hand. Slowly increase the pressure, squeezing the ball as much as you can without causing pain. Think of bringing the tips of your fingers into the middle of your palm. All of your finger joints should bend when doing this exercise. Hold your squeeze for 10 seconds, then relax. Repeat this exercise 5-10 times with each hand. Contact a health care provider if: Your hand pain or discomfort gets much worse when you do an exercise. Your hand pain or discomfort does not improve within 2 hours after you exercise. If you have either of these problems, stop doing these exercises right away. Do not do them again unless your provider says that you can. Get help right away if: You develop sudden, severe hand pain or swelling. If this happens, stop doing these exercises right away. Do not do them again unless your provider says that you can. This information is not intended to replace advice given to you by your health care provider. Make sure you discuss any questions you have with your health care provider. Document Revised: 05/05/2022 Document Reviewed: 05/05/2022 Elsevier Patient Education  2024 [employer].

## 2023-06-04 ENCOUNTER — Encounter: Payer: Self-pay | Admitting: Psychiatry

## 2023-06-04 ENCOUNTER — Encounter: Payer: Self-pay | Admitting: Cardiovascular Disease

## 2023-06-04 ENCOUNTER — Ambulatory Visit
Admission: RE | Admit: 2023-06-04 | Discharge: 2023-06-04 | Disposition: A | Discharge status: Home / Self Care | Payer: Self-pay | Source: Ambulatory Visit | Attending: Cardiovascular Disease | Admitting: Cardiovascular Disease

## 2023-06-04 ENCOUNTER — Telehealth (INDEPENDENT_AMBULATORY_CARE_PROVIDER_SITE_OTHER): Payer: 59 | Admitting: Psychiatry

## 2023-06-04 DIAGNOSIS — F1211 Cannabis abuse, in remission: Secondary | ICD-10-CM

## 2023-06-04 DIAGNOSIS — F4310 Post-traumatic stress disorder, unspecified: Secondary | ICD-10-CM

## 2023-06-04 DIAGNOSIS — R9431 Abnormal electrocardiogram [ECG] [EKG]: Secondary | ICD-10-CM | POA: Insufficient documentation

## 2023-06-04 DIAGNOSIS — F1011 Alcohol abuse, in remission: Secondary | ICD-10-CM | POA: Diagnosis not present

## 2023-06-04 DIAGNOSIS — R0609 Other forms of dyspnea: Secondary | ICD-10-CM | POA: Insufficient documentation

## 2023-06-04 DIAGNOSIS — F3341 Major depressive disorder, recurrent, in partial remission: Secondary | ICD-10-CM

## 2023-06-04 DIAGNOSIS — Z8249 Family history of ischemic heart disease and other diseases of the circulatory system: Secondary | ICD-10-CM | POA: Insufficient documentation

## 2023-06-04 DIAGNOSIS — I2510 Atherosclerotic heart disease of native coronary artery without angina pectoris: Secondary | ICD-10-CM

## 2023-06-04 HISTORY — DX: Atherosclerotic heart disease of native coronary artery without angina pectoris: I25.10

## 2023-06-04 NOTE — Progress Notes (Signed)
Virtual Visit via Video Note  I connected with Sandra Montes on 06/04/23 at 10:30 AM EST by a video enabled telemedicine application and verified that I am speaking with the correct person using two identifiers.  Location Provider Location : [facility] Patient Location : Car  Participants: Patient , Provider    I discussed the limitations of evaluation and management by telemedicine and the availability of in person appointments. The patient expressed understanding and agreed to proceed.    I discussed the assessment and treatment plan with the patient. The patient was provided an opportunity to ask questions and all were answered. The patient agreed with the plan and demonstrated an understanding of the instructions.   The patient was advised to call back or seek an in-person evaluation if the symptoms worsen or if the condition fails to improve as anticipated.   BH MD OP Progress Note  06/04/2023 1:03 PM Sandra Montes  MRN:  [id number]  Chief Complaint:  Chief Complaint  Patient presents with   Follow-up   Anxiety   Depression   Medication Refill   HPI: Sandra Montes is a 53 year old Caucasian female, divorced, currently employed, lives in [place], has a history of MDD, PTSD, history of alcohol and cannabis abuse in remission, emphysema, hypertension, asthma, osteoarthritis was evaluated by telemedicine today.  She has a history of major depression and PTSD, managed with Duloxetine 20 mg twice daily, Depakote 750 mg, Trazodone 50 mg at bedtime, and Hydroxyzine as needed. Her Depakote level is therapeutic at 54. She is also on Trazodone for sleep, which she finds effective, though she sometimes feels groggy in the morning. She ensures she gets 8.5 to 9 hours of sleep to mitigate this. No thoughts of self-harm, and she feels that 'things are looking up.'  She has gained forty pounds since September of the previous year, initially attributing this to medications. She is  concerned about the impact of this weight gain on her high blood pressure and arthritis, noting that 'old joints don't like a lot of weight.' She follows a Mediterranean anti-inflammatory diet and allows herself occasional dietary indulgences. She reports hair loss, initially attributed to menopause, and denies any other symptoms aside from weight gain.  She is currently undergoing tests in preparation for the removal of a large ovarian cyst that was recently discovered. The cyst was identified following an abdominal CT scan ordered by her general practitioner two weeks ago, after she sought clearance for meniscal tear surgery. An ultrasound performed  revealed that the cyst is too large to be seen transvaginally.  She is seeing a therapist, Mr.AJ Tschupp, once a week, which she finds helpful, especially in managing family stressors, such as conflicts with her mother. Her boyfriend, Sandra Montes, will support her during her recovery from surgery, and her mother is also available if needed. She lives with her mother, and her boyfriend is moving in to help prepare the house for sale.  She currently denies any suicidality, homicidality or perceptual disturbances.  Denies any other concerns today.   Visit Diagnosis:    ICD-10-CM   1. Recurrent major depressive disorder, in partial remission (HCC)  F33.41     2. PTSD (post-traumatic stress disorder)  F43.10     3. History of alcohol abuse  F10.11     4. History of cannabis abuse  F12.11       Past Psychiatric History: I have reviewed past psychiatric history from progress note on 03/24/2023.  Past trials of medications like Pristiq, Wellbutrin, Paxil,  Prozac.  Past Medical History:  Past Medical History:  Diagnosis Date   Abnormal EKG    Emphysema lung (HCC)    HLD (hyperlipidemia)    Hypertension     Past Surgical History:  Procedure Laterality Date   CESAREAN SECTION     1993   CHOLECYSTECTOMY     1996   COLONOSCOPY WITH PROPOFOL N/A  11/18/2021   Procedure: COLONOSCOPY WITH PROPOFOL;  Surgeon: Toney Reil, MD;  Location: [facility] END[facility]COPY;  Service: Gastroenterology;  Laterality: N/A;   TUBAL LIGATION     1995    Family Psychiatric History: I have reviewed family psychiatric history from progress note on 03/24/2023.  Family History:  Family History  Problem Relation Age of Onset   Depression Mother    Alcohol abuse Mother    Heart attack Mother 20   Skin cancer Father    Alcohol abuse Father    Prostate cancer Father    Rheum arthritis Paternal Grandmother    Prostate cancer Paternal Grandfather    Skin cancer Paternal Grandfather    Asthma Daughter    Mental illness Daughter        borderline   Mitral valve prolapse Daughter        & enlarged aorta   Migraines Daughter    Alcohol abuse Cousin    Healthy Son    Healthy Son     Social History: I have reviewed social history from progress note on 03/24/2023. Social History   Socioeconomic History   Marital status: Divorced    Spouse name: Not on file   Number of children: 3   Years of education: 12   Highest education level: Associate degree: occupational, Scientist, product/process development, or vocational program  Occupational History    Employer: [employer]    Comment: mortgage insurance  Tobacco Use   Smoking status: Former    Current packs/day: 0.00    Average packs/day: 1.7 packs/day for 57.0 years (95.5 ttl pk-yrs)    Types: Cigarettes    Start date: 2004    Quit date: 2024    Years since quitting: 1.0    Passive exposure: Current   Smokeless tobacco: Never   Tobacco comments:    Pt smokes two packs cigarettes for the past 20 yrs, wants 14 mg patch  Vaping Use   Vaping status: Never Used  Substance and Sexual Activity   Alcohol use: Not Currently    Comment: h/o alcoholism quit 2017   Drug use: Yes    Types: Marijuana    Comment: occ   Sexual activity: Not Currently    Partners: Male    Birth control/protection: None  Other Topics Concern    Not on file  Social History Narrative   Active smoker- lives in [place]; with mother- care giver; with quality/control- mortgage.    Social Drivers of Health   Financial Resource Strain: Medium Risk (05/14/2023)   Overall Financial Resource Strain (CARDIA)    Difficulty of Paying Living Expenses: Somewhat hard  Food Insecurity: No Food Insecurity (05/14/2023)   Hunger Vital Sign    Worried About Running Out of Food in the Last Year: Never true    Ran Out of Food in the Last Year: Never true  Transportation Needs: No Transportation Needs (05/14/2023)   PRAPARE - Administrator, Civil Service (Medical): No    Lack of Transportation (Non-Medical): No  Physical Activity: Unknown (05/14/2023)   Exercise Vital Sign    Days of Exercise per Week:  0 days    Minutes of Exercise per Session: Not on file  Stress: No Stress Concern Present (05/14/2023)   [employer] of Occupational Health - Occupational Stress Questionnaire    Feeling of Stress : Only a little  Social Connections: Socially Isolated (05/14/2023)   Social Connection and Isolation Panel [NHANES]    Frequency of Communication with Friends and Family: More than three times a week    Frequency of Social Gatherings with Friends and Family: Once a week    Attends Religious Services: Never    Database administrator or Organizations: No    Attends Engineer, structural: Not on file    Marital Status: Divorced    Allergies:  Allergies  Allergen Reactions   Buspar [Buspirone] Other (See Comments)    Optical migraines   Lisinopril Cough   Morphine Itching and Other (See Comments)    "tried to take her face off due to itching"    Sulfa Antibiotics Other (See Comments)    Passes out      Metabolic Disorder Labs: Lab Results  Component Value Date   HGBA1C 6.1 05/17/2023   MPG 111 07/04/2021   Lab Results  Component Value Date   PROLACTIN 4.3 01/13/2022   Lab Results  Component Value Date   CHOL  143 10/20/2022   TRIG 61.0 10/20/2022   HDL 47.40 10/20/2022   CHOLHDL 3 10/20/2022   VLDL 12.2 10/20/2022   LDLCALC 84 10/20/2022   LDLCALC 69 01/13/2022   Lab Results  Component Value Date   TSH 2.239 12/16/2022   TSH 1.90 01/13/2022    Therapeutic Level Labs: No results found for: "LITHIUM" Lab Results  Component Value Date   VALPROATE 54.0 05/17/2023   VALPROATE 64.6 01/01/2023   No results found for: "CBMZ"  Current Medications: Current Outpatient Medications  Medication Sig Dispense Refill   albuterol (VENTOLIN HFA) 108 (90 Base) MCG/ACT inhaler INHALE 2 PUFFS INTO THE LUNGS EVERY 4 HOURS AS NEEDED FOR WHEEZING OR SHORTNESS OF BREATH 8.5 g 0   atorvastatin (LIPITOR) 40 MG tablet Take 1 tablet (40 mg total) by mouth daily. 90 tablet 3   b complex vitamins capsule Take 1 capsule by mouth daily.     CALCIUM PO Take by mouth daily.     cyclobenzaprine (FLEXERIL) 10 MG tablet Take 1 tablet (10 mg total) by mouth 2 (two) times daily as needed. 20 tablet 0   divalproex (DEPAKOTE ER) 250 MG 24 hr tablet Take 1 tablet (250 mg total) by mouth daily with supper. Take along with 500 mg , total of 750 mg daily 90 tablet 0   divalproex (DEPAKOTE ER) 500 MG 24 hr tablet Take 1 tablet (500 mg total) by mouth at bedtime. Take along with 250 mg daily 90 tablet 0   DULoxetine (CYMBALTA) 20 MG capsule Take 1 capsule (20 mg total) by mouth 2 (two) times daily. 180 capsule 1   fish oil-omega-3 fatty acids 1000 MG capsule Take 2 g by mouth daily.     hydrochlorothiazide (HYDRODIURIL) 25 MG tablet Take 1 tablet (25 mg total) by mouth daily. 90 tablet 3   hydrOXYzine (ATARAX) 25 MG tablet Take 1 tablet (25 mg total) by mouth 3 (three) times daily as needed. 90 tablet 0   irbesartan (AVAPRO) 300 MG tablet Take 1 tablet (300 mg total) by mouth daily. 90 tablet 3   metoprolol succinate (TOPROL-XL) 50 MG 24 hr tablet Take 1 tablet (50 mg total) by  mouth daily. Take with or immediately following a  meal. 90 tablet 3   potassium chloride (KLOR-CON) 10 MEQ tablet Take 1 tablet (10 mEq total) by mouth 2 (two) times daily. 180 tablet 3   traZODone (DESYREL) 50 MG tablet Take 1 tablet (50 mg total) by mouth at bedtime as needed for sleep. 90 tablet 3   No current facility-administered medications for this visit.     Musculoskeletal: Strength & Muscle Tone:  UTA Gait & Station:  Seated Patient leans: N/A  Psychiatric Specialty Exam: Review of Systems  Psychiatric/Behavioral:  The patient is nervous/anxious.     There were no vitals taken for this visit.There is no height or weight on file to calculate BMI.  General Appearance: Casual  Eye Contact:  Fair  Speech:  Clear and Coherent  Volume:  Normal  Mood:  Anxious  Affect:  Appropriate  Thought Process:  Goal Directed and Descriptions of Associations: Intact  Orientation:  Full (Time, Place, and Person)  Thought Content: Logical   Suicidal Thoughts:  No  Homicidal Thoughts:  No  Memory:  Immediate;   Fair Recent;   Fair Remote;   Fair  Judgement:  Fair  Insight:  Fair  Psychomotor Activity:  Normal  Concentration:  Concentration: Fair and Attention Span: Fair  Recall:  [employer] of Knowledge: Fair  Language: Fair  Akathisia:  No  Handed:  Right  AIMS (if indicated): not done  Assets:  Communication Skills Desire for Improvement Housing Social Support  ADL's:  Intact  Cognition: WNL  Sleep:  Fair   Screenings: AUDIT    Flowsheet Row [facility]  Alcohol Use Disorder Identification Test Final Score (AUDIT) 0      GAD-7    Flowsheet Row Office Visit from 03/24/2023 in [facility] Office Visit from 03/18/2023 in [facility] [place] Office Visit from 02/16/2023 in [facility] [place] Office Visit from 02/11/2023 in [facility] [place] Office Visit from 01/28/2023 in [facility]  Total GAD-7 Score 3 5 3 11 15       PHQ2-9    Flowsheet Row Office Visit from 05/17/2023 in [facility] Office Visit from 03/24/2023 in [facility] Office Visit from 03/18/2023 in [facility] Office Visit from 02/16/2023 in [facility] Office Visit from 02/11/2023 in [facility] [place]  PHQ-2 Total Score 0 2 2 1 3   PHQ-9 Total Score 2 10 6 4 11       Flowsheet Row Video Visit from 06/04/2023 in [facility] Office Visit from 03/24/2023 in [facility] Admission (Discharged) [facility]  C-SSRS RISK CATEGORY No Risk No Risk Low Risk        Assessment and Plan: Asher Babilonia is a 53 year old Caucasian female, divorced, has a history of depression, PTSD, history of comorbid substance use currently in remission, currently improving on the current medication regimen, discussed assessment and plan as noted below.  Major Depression in partial remission Major depression managed well.Current medications are effective in managing mood symptoms. Reports improved handling of stressors and no suicidal ideation. Discussed potential weight gain side effects of Depakote and duloxetine. -  Continue Cymbalta 20 mg twice daily - Continue Depakote 750 mg daily with supper.( Depakote level - 54 therapeutic 05/17/2023) - Follow up with therapist weekly - Schedule follow-up appointment in three months  Post-Traumatic Stress Disorder (PTSD)-improving PTSD managed with current medication regimen. Reports improved coping mechanisms and handling of stressors, including recent family conflict. Weekly therapy  sessions with AJ have been beneficial. - Continue Trazodone 50 mg at bedtime as needed - Continue Hydroxyzine 25 mg 3 times a day as needed. - We will consider medication for nightmares in the future as needed. - Follow up with therapist weekly Mr.AJ Tschupp.  General Health Maintenance On a Mediterranean anti-inflammatory diet for arthritis. Cardiologist added potassium to regimen. - Continue Mediterranean anti-inflammatory diet - Adhere to potassium supplementation as prescribed by cardiologist  Follow-up - Schedule follow-up appointment for May 1st at 4:30 PM via video.  Collaboration of Care: Collaboration of Care: Referral or follow-up with counselor/therapist AEB encouraged to continue CBT.  Will coordinate care with therapist  Patient/Guardian was advised Release of Information must be obtained prior to any record release in order to collaborate their care with an outside provider. Patient/Guardian was advised if they have not already done so to contact the registration department to sign all necessary forms in order for [place] to release information regarding their care.   Consent: Patient/Guardian gives verbal consent for treatment and assignment of benefits for services provided during this visit. Patient/Guardian expressed understanding and agreed to proceed.   This note was generated in part or whole with voice recognition software. Voice recognition is usually quite accurate but there are transcription errors that can and very often do occur. I apologize for any typographical errors that were not detected and corrected.    Jomarie Longs, MD 06/04/2023, 1:03 PM

## 2023-06-07 ENCOUNTER — Encounter: Payer: Self-pay | Admitting: Family

## 2023-06-07 DIAGNOSIS — R809 Proteinuria, unspecified: Secondary | ICD-10-CM

## 2023-06-07 NOTE — Progress Notes (Signed)
ANA positive at 1: 320, ENA panel negative, complements normal, beta-2 GP 1 negative, anticardiolipin negative, urine protein creatinine ratio elevated.  Please forward results to her PCP for the evaluation of proteinuria.  I will discuss results at the follow-up visit.

## 2023-06-08 DIAGNOSIS — R809 Proteinuria, unspecified: Secondary | ICD-10-CM | POA: Insufficient documentation

## 2023-06-08 LAB — SJOGRENS SYNDROME-A EXTRACTABLE NUCLEAR ANTIBODY: SSA (Ro) (ENA) Antibody, IgG: <1 AI

## 2023-06-08 LAB — C3 AND C4
C3 Complement: 196 mg/dL — ABNORMAL HIGH (ref 83–193)
C4 Complement: 36 mg/dL (ref 15–57)

## 2023-06-08 LAB — RNP ANTIBODY: Ribonucleic Protein(ENA) Antibody, IgG: <1 AI

## 2023-06-08 LAB — ANA: Anti Nuclear Antibody (ANA): POSITIVE — AB

## 2023-06-08 LAB — ANTI-DNA ANTIBODY, DOUBLE-STRANDED: ds DNA Ab: 1 [IU]/mL

## 2023-06-08 LAB — ANTI-SMITH ANTIBODY: ENA SM Ab Ser-aCnc: <1 AI

## 2023-06-08 LAB — PROTEIN / CREATININE RATIO, URINE
Creatinine, Urine: 43 mg/dL (ref 20–275)
Protein/Creat Ratio: 256 mg/g{creat} — ABNORMAL HIGH (ref 24–184)
Protein/Creatinine Ratio: 0.256 mg/mg{creat} — ABNORMAL HIGH (ref 0.024–0.184)
Total Protein, Urine: 11 mg/dL (ref 5–24)

## 2023-06-08 LAB — ANTI-NUCLEAR AB-TITER (ANA TITER): ANA Titer 1: 1:320 {titer} — ABNORMAL HIGH

## 2023-06-08 LAB — CARDIOLIPIN ANTIBODIES, IGG, IGM, IGA
Anticardiolipin IgA: <2 [APL'U]/mL (ref <20.0)
Anticardiolipin IgG: <2 [GPL'U]/mL (ref <20.0)
Anticardiolipin IgM: <2 [MPL'U]/mL (ref <20.0)

## 2023-06-08 LAB — ANTI-SCLERODERMA ANTIBODY: Scleroderma (Scl-70) (ENA) Antibody, IgG: <1 AI

## 2023-06-08 LAB — CYCLIC CITRUL PEPTIDE ANTIBODY, IGG: Cyclic Citrullin Peptide Ab: <16 U

## 2023-06-08 LAB — LUPUS ANTICOAGULANT EVAL W/ REFLEX
PTT-LA Screen: 30 s (ref <=40)
dRVVT: 39 s (ref <=45)

## 2023-06-08 LAB — BETA-2 GLYCOPROTEIN ANTIBODIES
Beta-2 Glyco 1 IgA: <2 U/mL (ref <20.0)
Beta-2 Glyco 1 IgM: <2 U/mL (ref <20.0)
Beta-2 Glyco I IgG: <2 U/mL (ref <20.0)

## 2023-06-08 LAB — SJOGRENS SYNDROME-B EXTRACTABLE NUCLEAR ANTIBODY: SSB (La) (ENA) Antibody, IgG: <1 AI

## 2023-06-09 LAB — BASIC METABOLIC PANEL WITH GFR
BUN/Creatinine Ratio: 18 (ref 9–23)
BUN: 13 mg/dL (ref 6–24)
CO2: 29 mmol/L (ref 20–29)
Calcium: 9 mg/dL (ref 8.7–10.2)
Chloride: 99 mmol/L (ref 96–106)
Creatinine, Ser: 0.72 mg/dL (ref 0.57–1.00)
Glucose: 105 mg/dL — ABNORMAL HIGH (ref 70–99)
Potassium: 4.2 mmol/L (ref 3.5–5.2)
Sodium: 139 mmol/L (ref 134–144)
eGFR: 100 mL/min/{1.73_m2} (ref >59)

## 2023-06-09 LAB — LIPOPROTEIN A (LPA): Lipoprotein (a): 49.9 nmol/L (ref <75.0)

## 2023-06-10 ENCOUNTER — Telehealth: Payer: Self-pay

## 2023-06-10 DIAGNOSIS — Z79899 Other long term (current) drug therapy: Secondary | ICD-10-CM

## 2023-06-10 DIAGNOSIS — E782 Mixed hyperlipidemia: Secondary | ICD-10-CM

## 2023-06-10 MED ORDER — EZETIMIBE 10 MG PO TABS
10.0000 mg | ORAL_TABLET | Freq: Every day | ORAL | 3 refills | Status: DC
Start: 2023-06-10 — End: 2023-12-03

## 2023-06-10 NOTE — Telephone Encounter (Signed)
-----   Message from Ahmad Alert sent at 06/04/2023  6:08 PM EST ----- CAC score is 450 ( 99th percentile for age / sex matched controls)  Her last LDL was 5.  The LDL from Sept, 2023 was at goal with a level of 69.  Add zetia 10 mg a day . Continue to work on diet, exercise, weight loss  Recheck lipids and ALT in 3 months

## 2023-06-10 NOTE — Telephone Encounter (Signed)
 Called and spoke with patient who agrees to plan. Zetia  sent to pharmacy on file and repeat labs ordered and released so can be drawn.

## 2023-06-11 IMAGING — CT CT CHEST LUNG CANCER SCREENING LOW DOSE W/O CM
2 of 5 series · 15 of 40 positions shown, 18 images · non-contrast
Comparison: 06/05/2020

CLINICAL DATA: Fifty-seven pack-year smoking history/current smoker



[Series 3: lung 1.00 · axial · 0.70mm/px · z∈[-1211,-914]mm · 12 of 327 slices shown, 15 images]
[im 15/327  mediastinal]
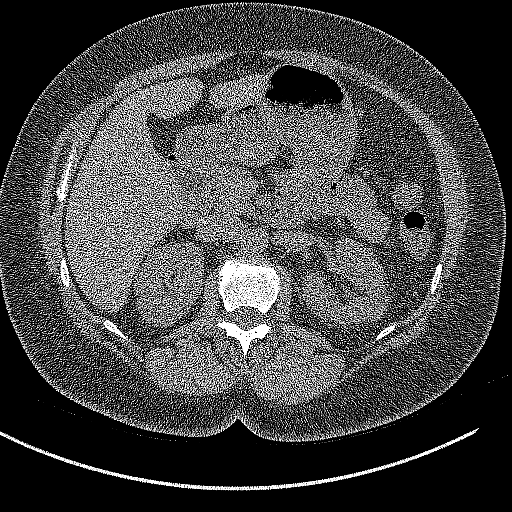
[im 15/327  lung]
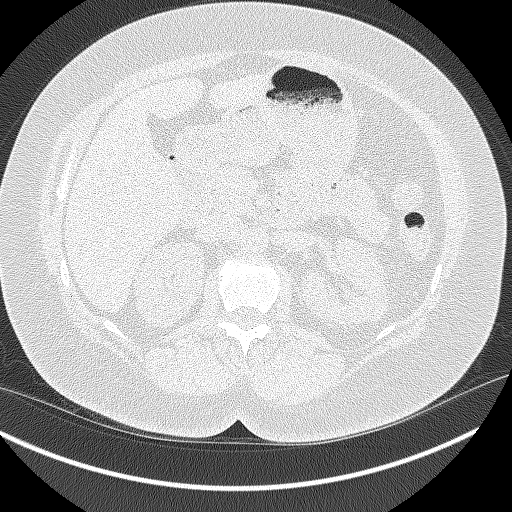
[im 45/327  lung]
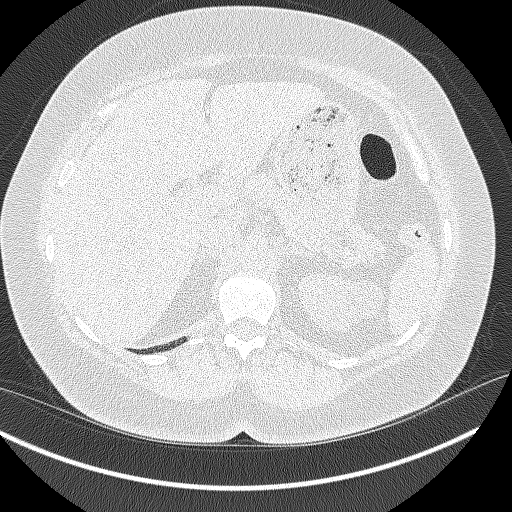
[im 75/327  lung]
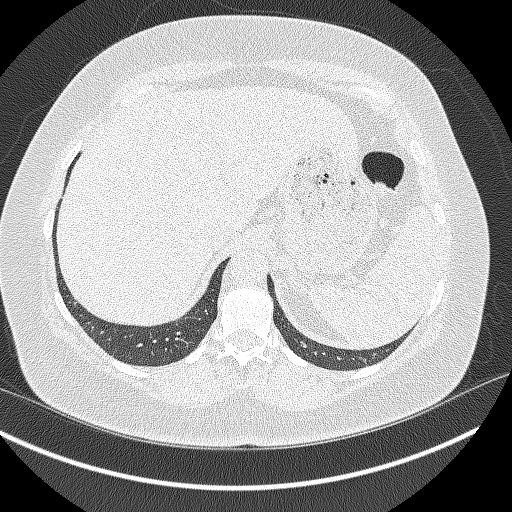
[im 104/327  lung]
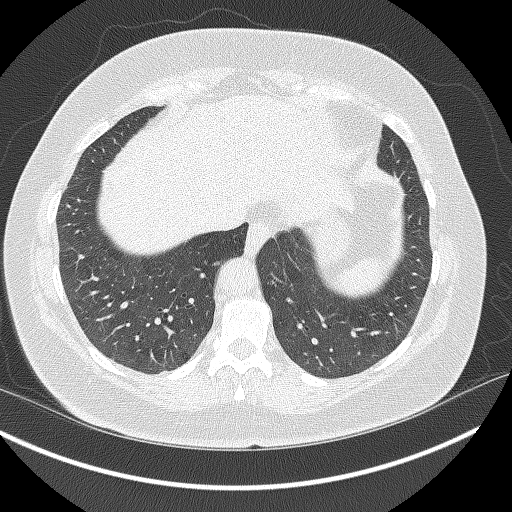
[im 119/327  mediastinal]
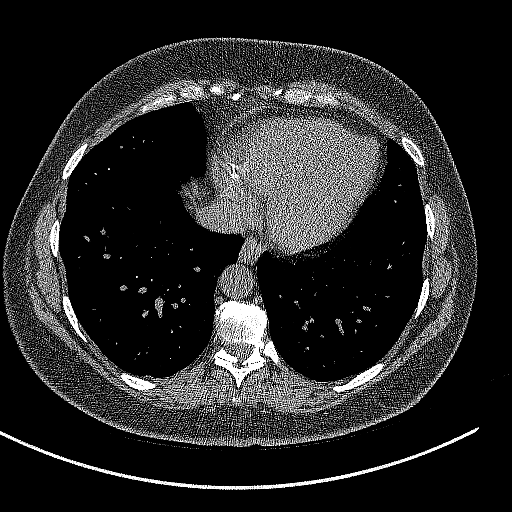
[im 119/327  lung]
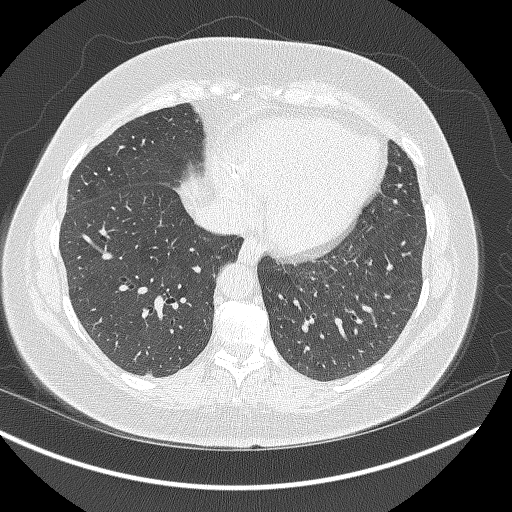
[im 149/327  lung]
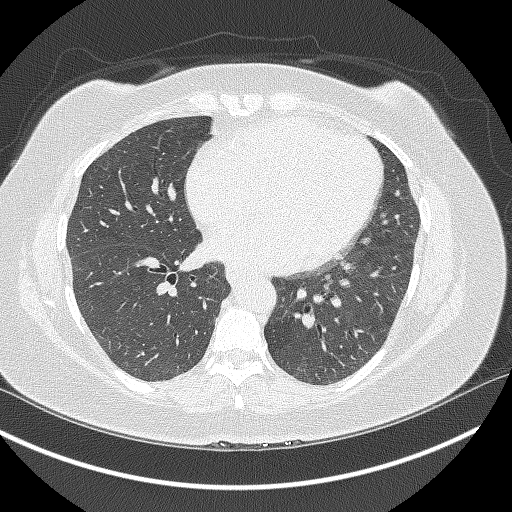
[im 178/327  lung]
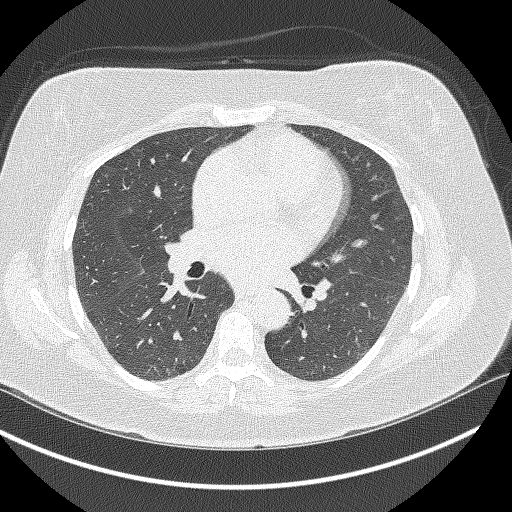
[im 208/327  lung]
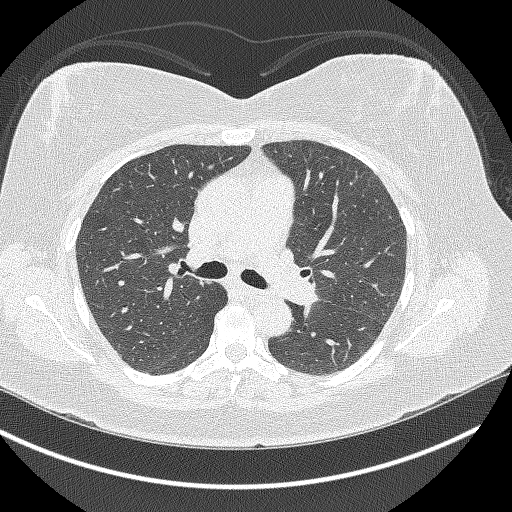
[im 223/327  mediastinal]
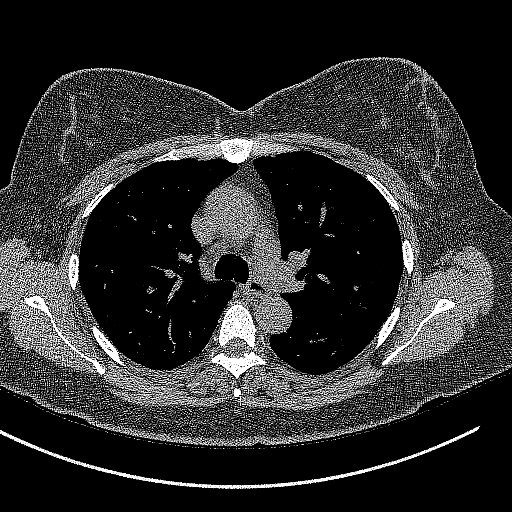
[im 223/327  lung]
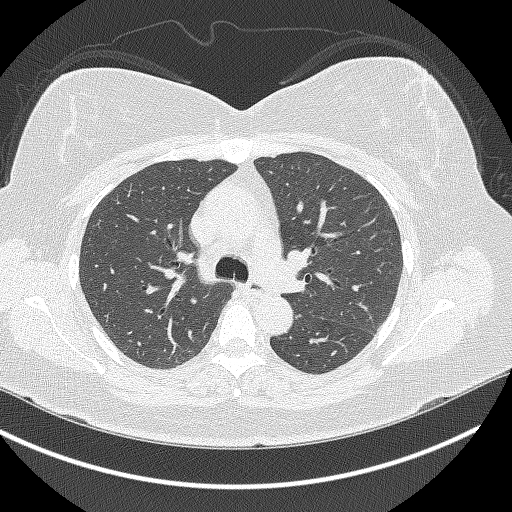
[im 252/327  lung]
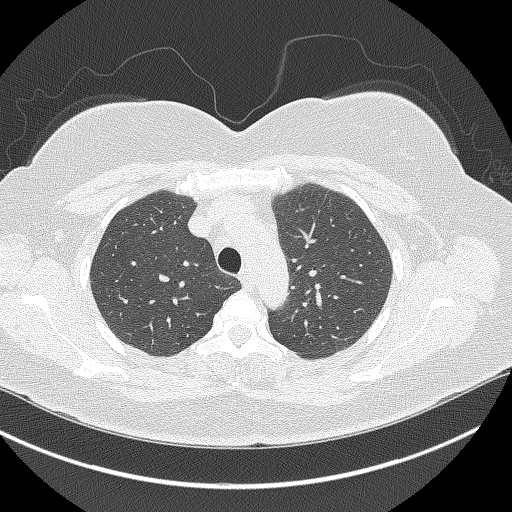
[im 282/327  lung]
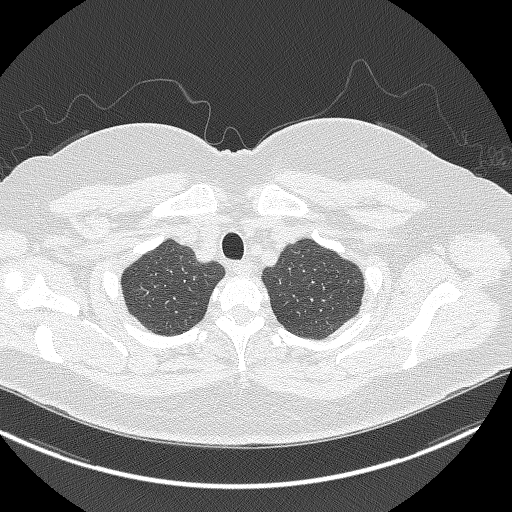
[im 312/327  lung]
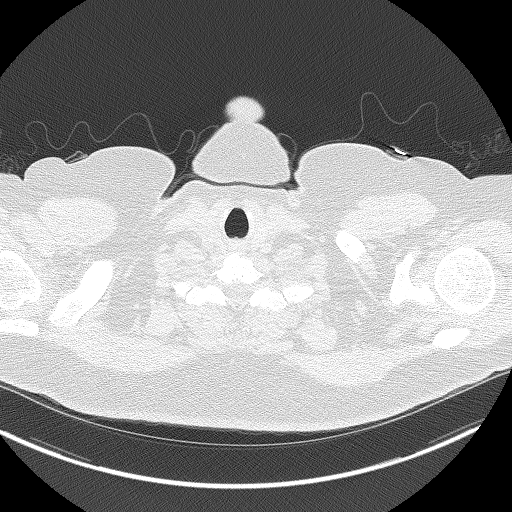

[Series 5: coronals lung 1.00 cor · coronal · 0.64mm/px · 3 of 322 slices shown]
[im 65/322  lung]
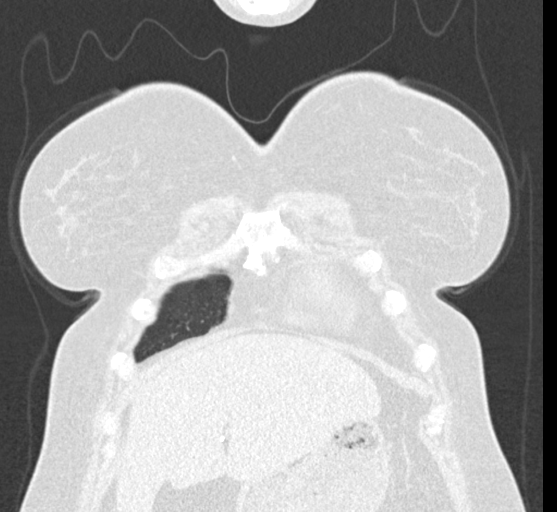
[im 129/322  lung]
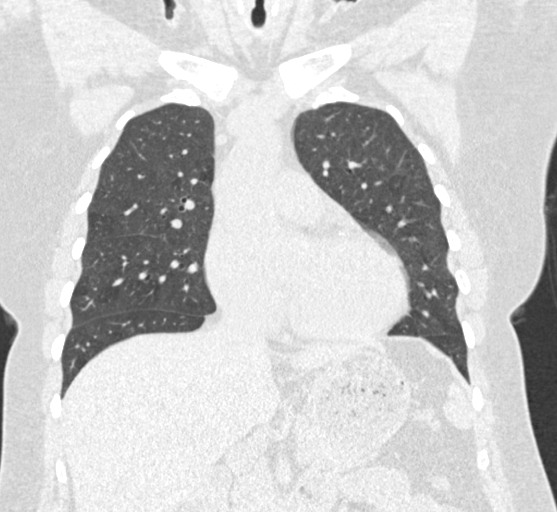
[im 193/322  lung]
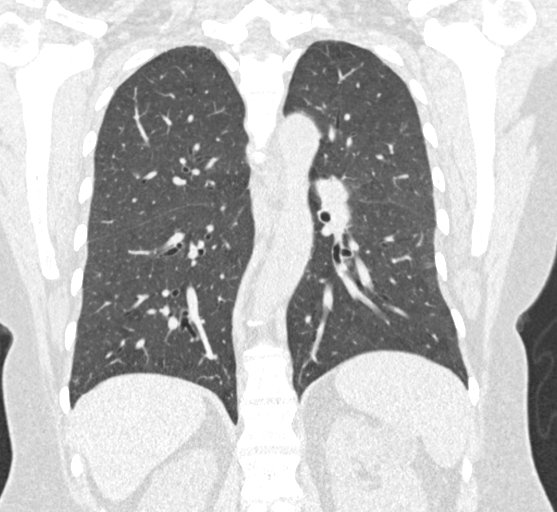

[15 of 40 positions shown; findings below may reference images not displayed]

FINDINGS: Cardiovascular: Normal aortic caliber. Mild cardiomegaly, without
pericardial effusion. Faint left circumflex and right coronary
artery calcification.

Mediastinum/Nodes: No mediastinal or definite hilar adenopathy,
given limitations of unenhanced CT.

Lungs/Pleura: No pleural fluid. Mild centrilobular emphysema.
Bilateral calcified granulomas. No noncalcified pulmonary nodules
identified.

Upper Abdomen: Cholecystectomy. Normal imaged portions of the liver,
spleen, stomach, pancreas, adrenal glands, right kidney. Upper pole
left renal 2.4 cm fluid density lesion is likely a cyst . No f/up
imaging recommended.

Musculoskeletal: No acute osseous abnormality.
IMPRESSION: 1. Lung-RADS 1, negative. Continue annual screening with low-dose
chest CT without contrast in 12 months.
2. Age advanced coronary artery atherosclerosis. Recommend
assessment of coronary risk factors.
3.  Emphysema ([id number]).

## 2023-06-13 ENCOUNTER — Encounter: Payer: Self-pay | Admitting: Cardiovascular Disease

## 2023-06-14 ENCOUNTER — Other Ambulatory Visit (INDEPENDENT_AMBULATORY_CARE_PROVIDER_SITE_OTHER): Payer: 59

## 2023-06-14 DIAGNOSIS — R809 Proteinuria, unspecified: Secondary | ICD-10-CM

## 2023-06-15 LAB — CREATININE CLEARANCE, URINE, 24 HOUR
Creatinine Clearance: 131 mL/min — ABNORMAL HIGH (ref 88–128)
Creatinine, 24H Ur: 1169 mg/(24.h) (ref 800–1800)
Creatinine, Ser: 0.62 mg/dL (ref 0.57–1.00)
Creatinine, Urine: 43.3 mg/dL
eGFR: 106 mL/min/{1.73_m2} (ref >59)

## 2023-06-15 NOTE — Progress Notes (Signed)
Pending results

## 2023-06-17 ENCOUNTER — Other Ambulatory Visit: Payer: Self-pay | Admitting: Family

## 2023-06-17 DIAGNOSIS — R809 Proteinuria, unspecified: Secondary | ICD-10-CM

## 2023-06-17 NOTE — Progress Notes (Signed)
Can we use the 24 hour urine for the new order I put in let me know.

## 2023-06-18 ENCOUNTER — Other Ambulatory Visit: Payer: Self-pay | Admitting: Psychiatry

## 2023-06-18 DIAGNOSIS — F332 Major depressive disorder, recurrent severe without psychotic features: Secondary | ICD-10-CM

## 2023-06-18 NOTE — Progress Notes (Signed)
 Office Visit Note  Patient: Sandra Montes             Date of Birth: 1970-05-18           MRN: [id number]             PCP: Mort Sawyers, FNP Referring: Mort Sawyers, FNP Visit Date: 07/02/2023 Occupation: @GUAROCC @  Subjective:  Pain in joints and positive ANA  History of Present Illness: Sandra Montes is a 53 y.o. female with positive ANA and joint pain.  She returns today after her last visit on June 03, 2023.  She states she continues to have pain and discomfort in her left shoulder and both hands.  She has been having severe pain and discomfort in her right knee joint.  Her right knee joint is still swollen.  She is awaiting right knee joint meniscal tear repair.Right knee joint meniscal tear repair is a scheduled approximately 2 to 3 months by Dr. Truddie Crumble.  She is scheduled to have ovarian cyst removed next week.  She denies history of oral ulcers, malar rash, photosensitivity, lymphadenopathy.  Activities of Daily Living:  Patient reports morning stiffness for 30-45 minutes.   Patient Reports nocturnal pain.  Difficulty dressing/grooming: Reports Difficulty climbing stairs: Reports Difficulty getting out of chair: Reports Difficulty using hands for taps, buttons, cutlery, and/or writing: Reports  Review of Systems  Constitutional:  Positive for fatigue.  HENT:  Positive for mouth dryness. Negative for mouth sores.   Eyes:  Positive for dryness.  Respiratory:  Positive for shortness of breath and wheezing.        COPD  Cardiovascular:  Negative for palpitations.  Gastrointestinal:  Negative for blood in stool, constipation and diarrhea.  Endocrine: Negative for increased urination.  Genitourinary:  Negative for involuntary urination.  Musculoskeletal:  Positive for joint pain, gait problem, joint pain, joint swelling, myalgias, muscle weakness, morning stiffness and myalgias. Negative for muscle tenderness.  Skin:  Positive for color change and hair loss.  Negative for rash and sensitivity to sunlight.  Allergic/Immunologic: Negative for susceptible to infections.  Neurological:  Negative for dizziness and headaches.  Hematological:  Negative for swollen glands.  Psychiatric/Behavioral:  Positive for depressed mood and sleep disturbance. The patient is nervous/anxious.     PMFS History:  Patient Active Problem List   Diagnosis Date Noted   Proteinuria 06/08/2023   Tear of lateral collateral ligament of right knee 05/17/2023   Acute meniscal tear of knee, right, sequela 05/17/2023   Electrocardiogram showing T wave abnormalities 05/17/2023   DOE (dyspnea on exertion) 05/17/2023   Abnormal weight gain 05/17/2023   Abdominal distension 05/17/2023   Pre-op evaluation 05/17/2023   PTSD (post-traumatic stress disorder) 03/24/2023   High risk medication use 03/24/2023   History of alcohol abuse 03/24/2023   History of cannabis abuse 03/24/2023   Cessation of tobacco use in previous 12 months 02/11/2023   History of suicidal ideation 01/28/2023   Sleep disorder 12/30/2022   MDD (major depressive disorder), recurrent episode, severe (HCC) 12/17/2022   Obsessional thoughts 10/20/2022   Family history of cardiac arrest 09/07/2022   Erythrocytosis 01/30/2022   Elevated hemoglobin (HCC) 01/13/2022   History of colon polyps 01/13/2022   Hot flushes, perimenopausal 01/13/2022   Anxiety state 01/13/2022   Irritable bowel syndrome with diarrhea 10/13/2021   Centrilobular emphysema (HCC) 07/04/2021   Non-seasonal allergic rhinitis due to pollen 07/04/2021   Dyslipidemia 12/29/2018   Essential hypertension 12/29/2018    Past Medical History:  Diagnosis Date  Abnormal EKG    Anxiety    Bartholin's gland abscess    Chronic left shoulder pain    Depression    DOE (dyspnea on exertion)    Emphysema lung (HCC)    History of alcohol abuse    History of allergy    History of cannabis abuse    History of suicidal ideation    HLD  (hyperlipidemia)    Hyperlipidemia    Hypertension    Irritable bowel syndrome with diarrhea    Lesion of right ovary    Nonspecific abnormal electrocardiogram (ECG)    Pain in both hands    Polyarthralgia    Positive ANA (antinuclear antibody)    PTSD (post-traumatic stress disorder)    Right adnexal tenderness    Urinary incontinence     Family History  Problem Relation Age of Onset   Depression Mother    Alcohol abuse Mother    Heart attack Mother 50   Skin cancer Father    Alcohol abuse Father    Prostate cancer Father    Rheum arthritis Paternal Grandmother    Prostate cancer Paternal Grandfather    Skin cancer Paternal Grandfather    Asthma Daughter    Mental illness Daughter        borderline   Mitral valve prolapse Daughter        & enlarged aorta   Migraines Daughter    Alcohol abuse Cousin    Healthy Son    Healthy Son    Past Surgical History:  Procedure Laterality Date   CESAREAN SECTION     1993   CHOLECYSTECTOMY     1996   COLONOSCOPY WITH PROPOFOL N/A 11/18/2021   Procedure: COLONOSCOPY WITH PROPOFOL;  Surgeon: Toney Reil, MD;  Location: [facility];  Service: Gastroenterology;  Laterality: N/A;   TUBAL LIGATION     1995   Social History   Social History Narrative   Active smoker- lives in [place]; with mother- care giver; with quality/control- mortgage.    Immunization History  Administered Date(s) Administered   Tdap 10/17/2014     Objective: Vital Signs: BP 120/84 (BP Location: Left Arm, Patient Position: Sitting, Cuff Size: Normal)   Pulse 65   Resp 15   Ht 5' 6.5" (1.689 m)   Wt 241 lb (109.3 kg)   LMP 10/04/2022 (Approximate) Comment: has period off/on  BMI 38.32 kg/m    Physical Exam Vitals and nursing note reviewed.  Constitutional:      Appearance: She is well-developed.  HENT:     Head: Normocephalic and atraumatic.  Eyes:     Conjunctiva/sclera: Conjunctivae normal.  Cardiovascular:     Rate and Rhythm:  Normal rate and regular rhythm.     Heart sounds: Normal heart sounds.  Pulmonary:     Effort: Pulmonary effort is normal.     Breath sounds: Normal breath sounds.  Abdominal:     General: Bowel sounds are normal.     Palpations: Abdomen is soft.  Musculoskeletal:     Cervical back: Normal range of motion.  Lymphadenopathy:     Cervical: No cervical adenopathy.  Skin:    General: Skin is warm and dry.     Capillary Refill: Capillary refill takes less than 2 seconds.  Neurological:     Mental Status: She is alert and oriented to person, place, and time.  Psychiatric:        Behavior: Behavior normal.      Musculoskeletal Exam: Cervical, thoracic  and lumbar spine 1 good range of motion.  Shoulders with good range of motion.  She had discomfort over left subacromial region.  Mild tenderness over the left deltoid.  Elbow joints, wrist joints, MCPs were in good range of motion.  She had bilateral PIP and DIP thickening with decreased range of motion of her DIP joints.  No synovitis was noted.  Right knee joint was warm and swollen with moderate effusion.  Left knee joint was in good range of motion without any warmth swelling or effusion.  There was no tenderness over ankles or MTPs.  CDAI Exam: CDAI Score: -- Patient Global: --; Provider Global: -- Swollen: --; Tender: -- Joint Exam 07/02/2023   No joint exam has been documented for this visit   There is currently no information documented on the homunculus. Go to the Rheumatology activity and complete the homunculus joint exam.  Investigation: No additional findings.  Imaging: CT CARDIAC SCORING (SELF PAY ONLY) Addendum Date: 06/10/2023 ADDENDUM REPORT: 06/10/2023 15:57 EXAM: OVER-READ INTERPRETATION CT CHEST The following report is an over-read performed by radiologist Dr. Romona Curls of [facility] on 06/10/2023. This over-read does not include interpretation of cardiac or coronary anatomy or pathology. The coronary  calcium score interpretation by the cardiologist is attached. COMPARISON:  Chest CT dated 09/07/2022 FINDINGS: Cardiovascular: No significant extracardiac vascular findings. Normal heart size. No pericardial effusion. Mediastinum/Nodes: No enlarged mediastinal lymph nodes. The visible trachea and esophagus demonstrate no significant findings. Lungs/Pleura: The visible lungs are clear. No pleural effusion. Upper Abdomen: No acute abnormality. Musculoskeletal: No chest wall mass or suspicious bone lesions identified. IMPRESSION: No significant extracardiac findings. Electronically Signed   By: Romona Curls M.D.   On: 06/10/2023 15:57   Result Date: 06/10/2023 CLINICAL DATA:  Risk stratification EXAM: Coronary Calcium Score TECHNIQUE: The patient was scanned on a Siemens Somatom scanner. Axial non-contrast 3 mm slices were carried out through the heart. The data set was analyzed on a dedicated work station and scored using the Agatson method. FINDINGS: Non-cardiac: See separate report from [facility]. Ascending Aorta: Normal size Pericardium: Normal Coronary arteries: Normal origin of left and right coronary arteries. Distribution of arterial calcifications if present, as noted below; LM 0 LAD 27.6 LCx 66.1 RCA 356 Total 450 IMPRESSION AND RECOMMENDATION: 1. Coronary calcium score of 450. This was 99th percentile for age and sex matched control. 2. CAC >300 in LAD, LCx, RCA. CAC-DRS A3/N3. 3. Recommend aspirin and statin if no contraindication. 4. Recommend cardiology consultation. 5. Continue heart healthy lifestyle and risk factor modification. Electronically Signed: By: Debbe Odea M.D. On: 06/04/2023 16:35   XR Shoulder Left Result Date: 06/03/2023 No glenohumeral joint space narrowing was noted.  Mild acromial spurring was noted.  No chondrocalcinosis was noted. Impression: These findings suggestive of early acromioclavicular degenerative changes.  XR Hand 2 View Left Result Date:  06/03/2023 CMC, PIP and DIP narrowing was noted.  No MCP, intercarpal or radiocarpal joint space narrowing was noted.  No erosive changes were noted. Impression: These findings are suggestive of osteoarthritis.  XR Hand 2 View Right Result Date: 06/03/2023 CMC, PIP and DIP narrowing was noted.  No MCP, intercarpal or radiocarpal joint space narrowing was noted.  No erosive changes were noted. Impression: These findings are suggestive of osteoarthritis.   Recent Labs: Lab Results  Component Value Date   WBC 6.5 05/17/2023   HGB 15.0 05/17/2023   PLT 171.0 05/17/2023   NA 139 06/08/2023   K 4.2 06/08/2023  CL 99 06/08/2023   CO2 29 06/08/2023   GLUCOSE 105 (H) 06/08/2023   BUN 13 06/08/2023   CREATININE 0.62 06/14/2023   BILITOT 0.5 05/17/2023   ALKPHOS 49 05/17/2023   AST 14 05/17/2023   ALT 11 05/17/2023   PROT 6.9 05/17/2023   ALBUMIN 4.3 05/17/2023   CALCIUM 9.0 06/08/2023   June 03, 2023 urine protein creatinine ratio 256, beta-2 GP 1 negative, anticardiolipin negative, C3-C4 normal, ANA 1: 320 nuclear/nucleolar, ENA (Smith, SSA, SSB, dsDNA, SCL 70, RNP) negative, anti-CCP negative  October 20, 2022 ANA 1: 80 NH, 1: 80 nucleolar, RF negative, sed rate 7 December 16, 2022 TSH normal, CK 61  Speciality Comments: No specialty comments available.  Procedures:  No procedures performed Allergies: Buspar [buspirone], Lisinopril, Morphine, Norvasc [amlodipine], and Sulfa antibiotics   Assessment / Plan:     Visit Diagnoses: Polyarthralgia -she continues to have pain in shoulders, hands, right knee and right ankle.  Chronic left shoulder pain -she had tenderness over left subacromial region.  X-rays obtained at the last visit showed acromioclavicular degenerative changes.  X-ray findings were reviewed with the patient.  I will hold off cortisone injection at this point as she is scheduled to have surgery next week.  A handout on shoulder joint exercises was given.  Primary  osteoarthritis of both hands - Clinical and radiographic findings were suggestive of osteoarthritis.  She had bilateral PIP and DIP thickening.  Detail counseled regarding osteoarthritis was provided.  A handout on joint muscle strengthening exercises were provided.  Joint protection was discussed.    Other old tear of meniscus of right knee, unspecified meniscus - History of knee joint aspiration.  Patient is followed at [facility].  MRI showed meniscal tear in the past.  Surgery postponed due to ovarian cyst.  Patient will have meniscal tear repair after the ovarian cyst surgery.  She is having difficulty walking due to right knee joint discomfort.  Positive ANA (antinuclear antibody) - ANA low titer positive 1: 320 nucleolar, ENA negative.  Complements normal.  Patient does not meet the criteria for lupus or any other autoimmune disease at this point.  I will refer her to nephrology for the evaluation of proteinuria which is not very significant.  I will reevaluate the situation after nephrology consult.- Plan: Ambulatory referral to Nephrology  Raynaud's disease without gangrene-she gives history of cold hands no nailbed capillary changes were noted.  No telangiectasia or sclerodactyly was noted.  Other proteinuria -patient was referred to nephrology.  Plan: Ambulatory referral to Nephrology  Essential hypertension-blood pressure was normal at 120/84.  Other medical problems are listed as follows:  Dyslipidemia  History of colon polyps  Irritable bowel syndrome with diarrhea  Non-seasonal allergic rhinitis due to pollen  PTSD (post-traumatic stress disorder)  Anxiety and depression  Sleep disorder  History of suicidal ideation  History of alcohol abuse  History of cannabis abuse  Former smoker  Family history of rheumatoid arthritis-paternal grandmother  Orders: Orders Placed This Encounter  Procedures   Ambulatory referral to Nephrology   No orders of the defined  types were placed in this encounter.    Follow-Up Instructions: Return in about 4 months (around 10/30/2023).   Pollyann Savoy, MD  Note - This record has been created using Animal nutritionist.  Chart creation errors have been sought, but may not always  have been located. Such creation errors do not reflect on  the standard of medical care.

## 2023-06-23 LAB — PE+INTERP(RFX IFE), 24-HR U

## 2023-06-23 LAB — SPECIMEN STATUS REPORT

## 2023-06-24 ENCOUNTER — Other Ambulatory Visit: Payer: Self-pay | Admitting: Family

## 2023-06-24 NOTE — Progress Notes (Signed)
 Dr. Clent Ridges I am so sorry I sent this to you by accident.  Please disregard

## 2023-06-24 NOTE — Progress Notes (Signed)
 Can you clarify?  It looks like they did not except this alternative order right?

## 2023-06-25 ENCOUNTER — Other Ambulatory Visit: Payer: Self-pay | Admitting: Obstetrics and Gynecology

## 2023-06-26 ENCOUNTER — Other Ambulatory Visit: Payer: Self-pay | Admitting: Psychiatry

## 2023-06-26 DIAGNOSIS — F332 Major depressive disorder, recurrent severe without psychotic features: Secondary | ICD-10-CM

## 2023-06-30 ENCOUNTER — Encounter
Admission: RE | Admit: 2023-06-30 | Discharge: 2023-06-30 | Disposition: A | Discharge status: Home / Self Care | Payer: 59 | Source: Ambulatory Visit | Attending: Obstetrics and Gynecology | Admitting: Obstetrics and Gynecology

## 2023-06-30 ENCOUNTER — Encounter: Payer: Self-pay | Admitting: *Deleted

## 2023-06-30 ENCOUNTER — Telehealth: Payer: Self-pay | Admitting: *Deleted

## 2023-06-30 ENCOUNTER — Other Ambulatory Visit: Payer: Self-pay

## 2023-06-30 HISTORY — DX: Hyperlipidemia, unspecified: E78.5

## 2023-06-30 HISTORY — DX: Other specified abnormal immunological findings in serum: R76.89

## 2023-06-30 HISTORY — DX: Unspecified urinary incontinence: R32

## 2023-06-30 HISTORY — DX: Abscess of Bartholin's gland: N75.1

## 2023-06-30 HISTORY — DX: Anxiety disorder, unspecified: F41.9

## 2023-06-30 HISTORY — DX: Other forms of dyspnea: R06.09

## 2023-06-30 HISTORY — DX: Pain in unspecified joint: M25.50

## 2023-06-30 HISTORY — DX: Depression, unspecified: F32.A

## 2023-06-30 HISTORY — DX: Pain in right hand: M79.641

## 2023-06-30 HISTORY — DX: Other chronic pain: G89.29

## 2023-06-30 HISTORY — DX: Post-traumatic stress disorder, unspecified: F43.10

## 2023-06-30 HISTORY — DX: Irritable bowel syndrome with diarrhea: K58.0

## 2023-06-30 HISTORY — DX: Alcohol abuse, in remission: F10.11

## 2023-06-30 HISTORY — DX: Allergy status to unspecified drugs, medicaments and biological substances: Z88.9

## 2023-06-30 HISTORY — DX: Cannabis abuse, in remission: F12.11

## 2023-06-30 HISTORY — DX: Other specified abnormal immunological findings in serum: R76.8

## 2023-06-30 HISTORY — DX: Pain in right hand: M79.642

## 2023-06-30 HISTORY — DX: Noninflammatory disorder of ovary, fallopian tube and broad ligament, unspecified: N83.9

## 2023-06-30 HISTORY — DX: Personal history of other mental and behavioral disorders: Z86.59

## 2023-06-30 HISTORY — DX: Abnormal electrocardiogram (ECG) (EKG): R94.31

## 2023-06-30 HISTORY — DX: Pelvic and perineal pain: R10.2

## 2023-06-30 NOTE — Patient Instructions (Addendum)
 Your procedure is scheduled on: Thursday 07/08/23 Report to the Registration Desk on the 1st floor of the [facility]. To find out your arrival time, please call [phone] between 1PM - 3PM on: Wednesday 07/07/23  If your arrival time is 6:00 am, do not arrive before that time as the [facility] entrance doors do not open until 6:00 am.  REMEMBER: Instructions that are not followed completely may result in serious medical risk, up to and including death; or upon the discretion of your surgeon and anesthesiologist your surgery may need to be rescheduled.  Do not eat food after midnight the night before surgery.  No gum chewing or hard candies.  You may however, drink CLEAR liquids up to 2 hours before you are scheduled to arrive for your surgery. Do not drink anything within 2 hours of your scheduled arrival time.  Clear liquids include: - water  - apple juice without pulp - gatorade (not RED colors) - black coffee or tea (Do NOT add milk or creamers to the coffee or tea) Do NOT drink anything that is not on this list.   One week prior to surgery: Stop Anti-inflammatories (NSAIDS) such as Advil, Aleve, Ibuprofen, Motrin, Naproxen, Naprosyn and Aspirin based products such as Excedrin, Goody's Powder, BC Powder. Stop ANY OVER THE COUNTER supplements until after surgery one week prior to surgery. B Complex-C (B-COMPLEX WITH VITAMIN C)  Calcium Citrate-Vitamin D (CITRACAL PETITES/VITAMIN D  OMEGA-3 FATTY ACIDS   You may however, continue to take Tylenol if needed for pain up until the day of surgery.   Continue taking all of your other prescription medications up until the day of surgery.  ON THE DAY OF SURGERY ONLY TAKE THESE MEDICATIONS WITH SIPS OF WATER:  DULoxetine (CYMBALTA) 20 MG  metoprolol succinate (TOPROL-XL) 50 MG  hydrOXYzine (ATARAX) 25 MG    Use inhalers on the day of surgery and bring to the hospital. albuterol (VENTOLIN HFA) 108 (90 Base) MCG/ACT inhale   No  Alcohol for 24 hours before or after surgery.  No Smoking including e-cigarettes for 24 hours before surgery.  No chewable tobacco products for at least 6 hours before surgery.  No nicotine patches on the day of surgery.  Do not use any "recreational" drugs for at least a week (preferably 2 weeks) before your surgery.  Please be advised that the combination of cocaine and anesthesia may have negative outcomes, up to and including death. If you test positive for cocaine, your surgery will be cancelled.  On the morning of surgery brush your teeth with toothpaste and water, you may rinse your mouth with mouthwash if you wish. Do not swallow any toothpaste or mouthwash.  Use CHG Soap as directed on instruction sheet.  Do not wear jewelry, make-up, hairpins, clips or nail polish.  For welded (permanent) jewelry: bracelets, anklets, waist bands, etc.  Please have this removed prior to surgery.  If it is not removed, there is a chance that hospital personnel will need to cut it off on the day of surgery.  Do not wear lotions, powders, or perfumes.   Do not shave body hair from the neck down 48 hours before surgery.  Contact lenses, hearing aids and dentures may not be worn into surgery.  Do not bring valuables to the hospital. [facility] is not responsible for any missing/lost belongings or valuables.   Notify your doctor if there is any change in your medical condition (cold, fever, infection).  Wear comfortable clothing (specific  to your surgery type) to the hospital.  After surgery, you can help prevent lung complications by doing breathing exercises.  Take deep breaths and cough every 1-2 hours. Your doctor may order a device called an Incentive Spirometer to help you take deep breaths. When coughing or sneezing, hold a pillow firmly against your incision with both hands. This is called "splinting." Doing this helps protect your incision. It also decreases belly discomfort.  If you  are being admitted to the hospital overnight, leave your suitcase in the car. After surgery it may be brought to your room.  In case of increased patient census, it may be necessary for you, the patient, to continue your postoperative care in the Same Day Surgery department.  If you are being discharged the day of surgery, you will not be allowed to drive home. You will need a responsible individual to drive you home and stay with you for 24 hours after surgery.   If you are taking public transportation, you will need to have a responsible individual with you.  Please call the Pre-admissions Testing Dept. at [phone] if you have any questions about these instructions.  Surgery Visitation Policy:  Patients having surgery or a procedure may have two visitors.  Children under the age of 41 must have an adult with them who is not the patient.  Temporary Visitor Restrictions Due to increasing cases of flu, RSV and COVID-19: Children ages 29 and under will not be able to visit patients in [facility] hospitals under most circumstances.  Inpatient Visitation:    Visiting hours are 7 a.m. to 8 p.m. Up to four visitors are allowed at one time in a patient room. The visitors may rotate out with other people during the day.  One visitor age 44 or older may stay with the patient overnight and must be in the room by 8 p.m.    Preparing for Surgery with CHLORHEXIDINE GLUCONATE (CHG) Soap  Chlorhexidine Gluconate (CHG) Soap  o An antiseptic cleaner that kills germs and bonds with the skin to continue killing germs even after washing  o Used for showering the night before surgery and morning of surgery  Before surgery, you can play an important role by reducing the number of germs on your skin.  CHG (Chlorhexidine gluconate) soap is an antiseptic cleanser which kills germs and bonds with the skin to continue killing germs even after washing.  Please do not use if you have an allergy to  CHG or antibacterial soaps. If your skin becomes reddened/irritated stop using the CHG.  1. Shower the NIGHT BEFORE SURGERY and the MORNING OF SURGERY with CHG soap.  2. If you choose to wash your hair, wash your hair first as usual with your normal shampoo.  3. After shampooing, rinse your hair and body thoroughly to remove the shampoo.  4. Use CHG as you would any other liquid soap. You can apply CHG directly to the skin and wash gently with a scrungie or a clean washcloth.  5. Apply the CHG soap to your body only from the neck down. Do not use on open wounds or open sores. Avoid contact with your eyes, ears, mouth, and genitals (private parts). Wash face and genitals (private parts) with your normal soap.  6. Wash thoroughly, paying special attention to the area where your surgery will be performed.  7. Thoroughly rinse your body with warm water.  8. Do not shower/wash with your normal soap after using and rinsing off  the CHG soap.  9. Pat yourself dry with a clean towel.  10. Wear clean pajamas to bed the night before surgery.  12. Place clean sheets on your bed the night of your first shower and do not sleep with pets.  13. Shower again with the CHG soap on the day of surgery prior to arriving at the hospital.  14. Do not apply any deodorants/lotions/powders.  15. Please wear clean clothes to the hospital.

## 2023-06-30 NOTE — Telephone Encounter (Signed)
-----   Message from Verlee Monte sent at 06/30/2023  4:09 PM EST ----- Regarding: Request for pre-operative cardiac clearance Request for pre-operative cardiac clearance:  1. What type of surgery is being performed?  XI ROBOTIC ASSISTED BILATERAL SALPINGO OOPHORECTOMY, REMOVAL OF ADNEXAL MASS, ABDOMINAL MASS  2. When is this surgery scheduled?  07/08/2023  3. Type of clearance being requested (medical, pharmacy, both)? MEDICAL   4. Are there any medications that need to be held prior to surgery? NONE  5. Practice name and name of physician performing surgery?  Performing surgeon: Dr. Christeen Douglas, MD Requesting clearance: Quentin Mulling, FNP-C    6. Anesthesia type (none, local, MAC, general)? GENERAL  7. What is the office phone and fax number?   Fax: [phone]  ATTENTION: Unable to create telephone message as per your standard workflow. Directed by [facility] providers to send requests for cardiac clearance to this pool for appropriate distribution to provider covering pre-operative clearances.   Quentin Mulling, MSN, APRN, FNP-C, CEN [facility]  Peri-operative Services Nurse Practitioner Phone: [phone] 06/30/23 4:10 PM

## 2023-06-30 NOTE — Telephone Encounter (Signed)
   Pre-operative Risk Assessment    Patient Name: Sandra Montes  DOB: [birth year] MRN: [id number]   Date of last office visit: 05/25/23 DR. NAHSER Date of next office visit: 09/06/23 DR. NAHSER   Request for Surgical Clearance    Procedure:   XI ROBOTIC ASSISTED BILATERAL SALPINGO OOPHORECTOMY, REMOVAL OF ADNEXAL MASS, ABDOMINAL MASS  Date of Surgery:  Clearance 07/08/23                                Surgeon:  DR. Christeen Douglas Surgeon's Group or Practice Name:  [facility] Phone number:  [phone] Fax number:  [phone]   Type of Clearance Requested:   - Medical ; PER CLEARANCE REQUEST NONE TO BE HELD   Type of Anesthesia:  General    Additional requests/questions:    Elpidio Anis   06/30/2023, 4:23 PM

## 2023-07-01 NOTE — Telephone Encounter (Signed)
   Patient Name: Dwana Garin  DOB: 26-Nov-1970 MRN: [id number]  Primary Cardiologist: None  Chart reviewed as part of pre-operative protocol coverage.  Per Dr. Elease Hashimoto, "recent echo shows normal LV systolic function with grade I DD, Trivial valvular disease. She is at low risk for her upcoming GYN surgery. PN"   Additionally, recent coronary CTA showed nonobstructive CAD.   Therefore, given past medical history and time since last visit, based on ACC/AHA guidelines, Paisli Silfies is at acceptable risk for the planned procedure without further cardiovascular testing.   I will route this recommendation to the requesting party via Epic fax function and remove from pre-op pool.  Please call with questions.  Joylene Grapes, NP 07/01/2023, 8:21 AM

## 2023-07-02 ENCOUNTER — Ambulatory Visit: Payer: 59 | Attending: Rheumatology | Admitting: Rheumatology

## 2023-07-02 ENCOUNTER — Encounter: Payer: Self-pay | Admitting: Rheumatology

## 2023-07-02 VITALS — BP 120/84 | HR 65 | Resp 15 | Ht 66.5 in | Wt 241.0 lb

## 2023-07-02 DIAGNOSIS — M19041 Primary osteoarthritis, right hand: Secondary | ICD-10-CM

## 2023-07-02 DIAGNOSIS — R768 Other specified abnormal immunological findings in serum: Secondary | ICD-10-CM

## 2023-07-02 DIAGNOSIS — Z860100 Personal history of colon polyps, unspecified: Secondary | ICD-10-CM

## 2023-07-02 DIAGNOSIS — Z8261 Family history of arthritis: Secondary | ICD-10-CM

## 2023-07-02 DIAGNOSIS — M25512 Pain in left shoulder: Secondary | ICD-10-CM

## 2023-07-02 DIAGNOSIS — G8929 Other chronic pain: Secondary | ICD-10-CM

## 2023-07-02 DIAGNOSIS — M19042 Primary osteoarthritis, left hand: Secondary | ICD-10-CM

## 2023-07-02 DIAGNOSIS — M23206 Derangement of unspecified meniscus due to old tear or injury, right knee: Secondary | ICD-10-CM

## 2023-07-02 DIAGNOSIS — F4310 Post-traumatic stress disorder, unspecified: Secondary | ICD-10-CM

## 2023-07-02 DIAGNOSIS — F419 Anxiety disorder, unspecified: Secondary | ICD-10-CM

## 2023-07-02 DIAGNOSIS — Z8659 Personal history of other mental and behavioral disorders: Secondary | ICD-10-CM

## 2023-07-02 DIAGNOSIS — Z87891 Personal history of nicotine dependence: Secondary | ICD-10-CM

## 2023-07-02 DIAGNOSIS — E785 Hyperlipidemia, unspecified: Secondary | ICD-10-CM

## 2023-07-02 DIAGNOSIS — M2550 Pain in unspecified joint: Secondary | ICD-10-CM | POA: Diagnosis not present

## 2023-07-02 DIAGNOSIS — I10 Essential (primary) hypertension: Secondary | ICD-10-CM

## 2023-07-02 DIAGNOSIS — F1211 Cannabis abuse, in remission: Secondary | ICD-10-CM

## 2023-07-02 DIAGNOSIS — J301 Allergic rhinitis due to pollen: Secondary | ICD-10-CM

## 2023-07-02 DIAGNOSIS — I7300 Raynaud's syndrome without gangrene: Secondary | ICD-10-CM

## 2023-07-02 DIAGNOSIS — F1011 Alcohol abuse, in remission: Secondary | ICD-10-CM

## 2023-07-02 DIAGNOSIS — G479 Sleep disorder, unspecified: Secondary | ICD-10-CM

## 2023-07-02 DIAGNOSIS — R808 Other proteinuria: Secondary | ICD-10-CM

## 2023-07-02 DIAGNOSIS — K580 Irritable bowel syndrome with diarrhea: Secondary | ICD-10-CM

## 2023-07-02 DIAGNOSIS — F32A Depression, unspecified: Secondary | ICD-10-CM

## 2023-07-02 NOTE — Patient Instructions (Addendum)
 Hand Exercises Hand exercises can be helpful for almost anyone. They can strengthen your hands and improve flexibility and movement. The exercises can also increase blood flow to the hands. These results can make your work and daily tasks easier for you. Hand exercises can be especially helpful for people who have joint pain from arthritis or nerve damage from using their hands over and over. These exercises can also help people who injure a hand. Exercises Most of these hand exercises are gentle stretching and motion exercises. It is usually safe to do them often throughout the day. Warming up your hands before exercise may help reduce stiffness. You can do this with gentle massage or by placing your hands in warm water for 10-15 minutes. It is normal to feel some stretching, pulling, tightness, or mild discomfort when you begin new exercises. In time, this will improve. Remember to always be careful and stop right away if you feel sudden, very bad pain or your pain gets worse. You want to get better and be safe. Ask your health care provider which exercises are safe for you. Do exercises exactly as told by your provider and adjust them as told. Do not begin these exercises until told by your provider. Knuckle bend or "claw" fist  Stand or sit with your arm, hand, and all five fingers pointed straight up. Make sure to keep your wrist straight. Gently bend your fingers down toward your palm until the tips of your fingers are touching your palm. Keep your big knuckle straight and only bend the small knuckles in your fingers. Hold this position for 10 seconds. Straighten your fingers back to your starting position. Repeat this exercise 5-10 times with each hand. Full finger fist  Stand or sit with your arm, hand, and all five fingers pointed straight up. Make sure to keep your wrist straight. Gently bend your fingers into your palm until the tips of your fingers are touching the middle of your  palm. Hold this position for 10 seconds. Extend your fingers back to your starting position, stretching every joint fully. Repeat this exercise 5-10 times with each hand. Straight fist  Stand or sit with your arm, hand, and all five fingers pointed straight up. Make sure to keep your wrist straight. Gently bend your fingers at the big knuckle, where your fingers meet your hand, and at the middle knuckle. Keep the knuckle at the tips of your fingers straight and try to touch the bottom of your palm. Hold this position for 10 seconds. Extend your fingers back to your starting position, stretching every joint fully. Repeat this exercise 5-10 times with each hand. Tabletop  Stand or sit with your arm, hand, and all five fingers pointed straight up. Make sure to keep your wrist straight. Gently bend your fingers at the big knuckle, where your fingers meet your hand, as far down as you can. Keep the small knuckles in your fingers straight. Think of forming a tabletop with your fingers. Hold this position for 10 seconds. Extend your fingers back to your starting position, stretching every joint fully. Repeat this exercise 5-10 times with each hand. Finger spread  Place your hand flat on a table with your palm facing down. Make sure your wrist stays straight. Spread your fingers and thumb apart from each other as far as you can until you feel a gentle stretch. Hold this position for 10 seconds. Bring your fingers and thumb tight together again. Hold this position for 10 seconds. Repeat  this exercise 5-10 times with each hand. Making circles  Stand or sit with your arm, hand, and all five fingers pointed straight up. Make sure to keep your wrist straight. Make a circle by touching the tip of your thumb to the tip of your index finger. Hold for 10 seconds. Then open your hand wide. Repeat this motion with your thumb and each of your fingers. Repeat this exercise 5-10 times with each hand. Thumb  motion  Sit with your forearm resting on a table and your wrist straight. Your thumb should be facing up toward the ceiling. Keep your fingers relaxed as you move your thumb. Lift your thumb up as high as you can toward the ceiling. Hold for 10 seconds. Bend your thumb across your palm as far as you can, reaching the tip of your thumb for the small finger (pinkie) side of your palm. Hold for 10 seconds. Repeat this exercise 5-10 times with each hand. Grip strengthening  Hold a stress ball or other soft ball in the middle of your hand. Slowly increase the pressure, squeezing the ball as much as you can without causing pain. Think of bringing the tips of your fingers into the middle of your palm. All of your finger joints should bend when doing this exercise. Hold your squeeze for 10 seconds, then relax. Repeat this exercise 5-10 times with each hand. Contact a health care provider if: Your hand pain or discomfort gets much worse when you do an exercise. Your hand pain or discomfort does not improve within 2 hours after you exercise. If you have either of these problems, stop doing these exercises right away. Do not do them again unless your provider says that you can. Get help right away if: You develop sudden, severe hand pain or swelling. If this happens, stop doing these exercises right away. Do not do them again unless your provider says that you can. This information is not intended to replace advice given to you by your health care provider. Make sure you discuss any questions you have with your health care provider. Document Revised: 05/05/2022 Document Reviewed: 05/05/2022 Else[employer] Patient Education  2024 [employer] Shoulder Exercises Ask your health care provider which exercises are safe for you. Do exercises exactly as told by your health care provider and adjust them as directed. It is normal to feel mild stretching, pulling, tightness, or discomfort as you do these exercises.  Stop right away if you feel sudden pain or your pain gets worse. Do not begin these exercises until told by your health care provider. Stretching exercises External rotation and abduction This exercise is sometimes called corner stretch. The exercise rotates your arm outward (external rotation) and moves your arm out from your body (abduction). Stand in a doorway with one of your feet slightly in front of the other. This is called a staggered stance. If you cannot reach your forearms to the door frame, stand facing a corner of a room. Choose one of the following positions as told by your health care provider: Place your hands and forearms on the door frame above your head. Place your hands and forearms on the door frame at the height of your head. Place your hands on the door frame at the height of your elbows. Slowly move your weight onto your front foot until you feel a stretch across your chest and in the front of your shoulders. Keep your head and chest upright and keep your abdominal muscles tight. Hold for __________  seconds. To release the stretch, shift your weight to your back foot. Repeat __________ times. Complete this exercise __________ times a day. Extension, standing  Stand and hold a broomstick, a cane, or a similar object behind your back. Your hands should be a little wider than shoulder-width apart. Your palms should face away from your back. Keeping your elbows straight and your shoulder muscles relaxed, move the stick away from your body until you feel a stretch in your shoulders (extension). Avoid shrugging your shoulders while you move the stick. Keep your shoulder blades tucked down toward the middle of your back. Hold for __________ seconds. Slowly return to the starting position. Repeat __________ times. Complete this exercise __________ times a day. Range-of-motion exercises Pendulum  Stand near a wall or a surface that you can hold onto for balance. Bend at the  waist and let your left / right arm hang straight down. Use your other arm to support you. Keep your back straight and do not lock your knees. Relax your left / right arm and shoulder muscles, and move your hips and your trunk so your left / right arm swings freely. Your arm should swing because of the motion of your body, not because you are using your arm or shoulder muscles. Keep moving your hips and trunk so your arm swings in the following directions, as told by your health care provider: Side to side. Forward and backward. In clockwise and counterclockwise circles. Continue each motion for __________ seconds, or for as long as told by your health care provider. Slowly return to the starting position. Repeat __________ times. Complete this exercise __________ times a day. Shoulder flexion, standing  Stand and hold a broomstick, a cane, or a similar object. Place your hands a little more than shoulder-width apart on the object. Your left / right hand should be palm-up, and your other hand should be palm-down. Keep your elbow straight and your shoulder muscles relaxed. Push the stick up with your healthy arm to raise your left / right arm in front of your body, and then over your head until you feel a stretch in your shoulder (flexion). Avoid shrugging your shoulder while you raise your arm. Keep your shoulder blade tucked down toward the middle of your back. Hold for __________ seconds. Slowly return to the starting position. Repeat __________ times. Complete this exercise __________ times a day. Shoulder abduction, standing  Stand and hold a broomstick, a cane, or a similar object. Place your hands a little more than shoulder-width apart on the object. Your left / right hand should be palm-up, and your other hand should be palm-down. Keep your elbow straight and your shoulder muscles relaxed. Push the object across your body toward your left / right side. Raise your left / right arm to the  side of your body (abduction) until you feel a stretch in your shoulder. Do not raise your arm above shoulder height unless your health care provider tells you to do that. If directed, raise your arm over your head. Avoid shrugging your shoulder while you raise your arm. Keep your shoulder blade tucked down toward the middle of your back. Hold for __________ seconds. Slowly return to the starting position. Repeat __________ times. Complete this exercise __________ times a day. Internal rotation  Place your left / right hand behind your back, palm-up. Use your other hand to dangle an exercise band, a broomstick, or a similar object over your shoulder. Grasp the band with your left / right  hand so you are holding on to both ends. Gently pull up on the band until you feel a stretch in the front of your left / right shoulder. The movement of your arm toward the center of your body is called internal rotation. Avoid shrugging your shoulder while you raise your arm. Keep your shoulder blade tucked down toward the middle of your back. Hold for __________ seconds. Release the stretch by letting go of the band and lowering your hands. Repeat __________ times. Complete this exercise __________ times a day. Strengthening exercises External rotation  Sit in a stable chair without armrests. Secure an exercise band to a stable object at elbow height on your left / right side. Place a soft object, such as a folded towel or a small pillow, between your left / right upper arm and your body to move your elbow about 4 inches (10 cm) away from your side. Hold the end of the exercise band so it is tight and there is no slack. Keeping your elbow pressed against the soft object, slowly move your forearm out, away from your abdomen (external rotation). Keep your body steady so only your forearm moves. Hold for __________ seconds. Slowly return to the starting position. Repeat __________ times. Complete this  exercise __________ times a day. Shoulder abduction  Sit in a stable chair without armrests, or stand up. Hold a __________ lb / kg weight in your left / right hand, or hold an exercise band with both hands. Start with your arms straight down and your left / right palm facing in, toward your body. Slowly lift your left / right hand out to your side (abduction). Do not lift your hand above shoulder height unless your health care provider tells you that this is safe. Keep your arms straight. Avoid shrugging your shoulder while you do this movement. Keep your shoulder blade tucked down toward the middle of your back. Hold for __________ seconds. Slowly lower your arm, and return to the starting position. Repeat __________ times. Complete this exercise __________ times a day. Shoulder extension  Sit in a stable chair without armrests, or stand up. Secure an exercise band to a stable object in front of you so it is at shoulder height. Hold one end of the exercise band in each hand. Straighten your elbows and lift your hands up to shoulder height. Squeeze your shoulder blades together as you pull your hands down to the sides of your thighs (extension). Stop when your hands are straight down by your sides. Do not let your hands go behind your body. Hold for __________ seconds. Slowly return to the starting position. Repeat __________ times. Complete this exercise __________ times a day. Shoulder row  Sit in a stable chair without armrests, or stand up. Secure an exercise band to a stable object in front of you so it is at chest height. Hold one end of the exercise band in each hand. Position your palms so that your thumbs are facing the ceiling (neutral position). Bend each of your elbows to a 90-degree angle (right angle) and keep your upper arms at your sides. Step back or move the chair back until the band is tight and there is no slack. Slowly pull your elbows back behind you. Hold for  __________ seconds. Slowly return to the starting position. Repeat __________ times. Complete this exercise __________ times a day. Shoulder press-ups  Sit in a stable chair that has armrests. Sit upright, with your feet flat on the floor.  Put your hands on the armrests so your elbows are bent and your fingers are pointing forward. Your hands should be about even with the sides of your body. Push down on the armrests and use your arms to lift yourself off the chair. Straighten your elbows and lift yourself up as much as you comfortably can. Move your shoulder blades down, and avoid letting your shoulders move up toward your ears. Keep your feet on the ground. As you get stronger, your feet should support less of your body weight as you lift yourself up. Hold for __________ seconds. Slowly lower yourself back into the chair. Repeat __________ times. Complete this exercise __________ times a day. Wall push-ups  Stand so you are facing a stable wall. Your feet should be about one arm-length away from the wall. Lean forward and place your palms on the wall at shoulder height. Keep your feet flat on the floor as you bend your elbows and lean forward toward the wall. Hold for __________ seconds. Straighten your elbows to push yourself back to the starting position. Repeat __________ times. Complete this exercise __________ times a day. This information is not intended to replace advice given to you by your health care provider. Make sure you discuss any questions you have with your health care provider. Document Revised: 06/10/2021 Document Reviewed: 06/10/2021 Elsevier Patient Education  2024 [employer]  Osteoarthritis  Osteoarthritis is a type of arthritis. It refers to joint pain or joint disease. Osteoarthritis affects tissue that covers the ends of bones in joints (cartilage). Cartilage acts as a cushion between the bones and helps them move smoothly. Osteoarthritis occurs when  cartilage in the joints gets worn down. Osteoarthritis is sometimes called "wear and tear" arthritis. Osteoarthritis is the most common form of arthritis. It often occurs in older people. It is a condition that gets worse over time. The joints most often affected by this condition are in the fingers, toes, hips, knees, and spine, including the neck and lower back. What are the causes? This condition is caused by the wearing down of cartilage that covers the ends of bones. What increases the risk? The following factors may make you more likely to develop this condition: Being age 38 or older. Obesity. Overuse of joints. Past injury of a joint. Past surgery on a joint. Family history of osteoarthritis. What are the signs or symptoms? The main symptoms of this condition are pain, swelling, and stiffness in the joint. Other symptoms may include: An enlarged joint. More pain and further damage caused by small pieces of bone or cartilage that break off and float inside of the joint. Small deposits of bone (osteophytes) that grow on the edges of the joint. A grating or scraping feeling inside the joint when you move it. Popping or creaking sounds when you move. Difficulty walking or exercising. An inability to grip items, twist your hand, or control the movements of your hands and fingers. How is this diagnosed? This condition may be diagnosed based on: Your medical history. A physical exam. Your symptoms. X-rays of the affected joints. Blood tests to rule out other types of arthritis. How is this treated? There is no cure for this condition, but treatment can help control pain and improve joint function. Treatment may include a combination of therapies, such as: Pain relief techniques, such as: Applying heat and cold to the joint. Massage. A form of talk therapy called cognitive behavioral therapy (CBT). This therapy helps you set goals and follow up on  the changes that you  make. Medicines for pain and inflammation. The medicines can be taken by mouth or applied to the skin. They include: NSAIDs, such as ibuprofen. Prescription medicines. Strong anti-inflammatory medicines (corticosteroids). Certain nutritional supplements. A prescribed exercise program. You may work with a physical therapist. Assistive devices, such as a brace, wrap, splint, specialized glove, or cane. A weight control plan. Surgery, such as: An osteotomy. This is done to reposition the bones and relieve pain or to remove loose pieces of bone and cartilage. Joint replacement surgery. You may need this surgery if you have advanced osteoarthritis. Follow these instructions at home: Activity Rest your affected joints as told by your health care provider. Exercise as told by your provider. The provider may recommend specific types of exercise, such as: Strengthening exercises. These are done to strengthen the muscles that support joints affected by arthritis. Aerobic activities. These are exercises, such as brisk walking or water aerobics, that increase your heart rate. Range-of-motion activities. These help your joints move more easily. Balance and agility exercises. Managing pain, stiffness, and swelling     If told, apply heat to the affected area as often as told by your provider. Use the heat source that your provider recommends, such as a moist heat pack or a heating pad. If you have a removable assistive device, remove it as told by your provider. Place a towel between your skin and the heat source. If your provider tells you to keep the assistive device on while you apply heat, place a towel between the assistive device and the heat source. Leave the heat on for 20-30 minutes. If told, put ice on the affected area. If you have a removable assistive device, remove it as told by your provider. Put ice in a plastic bag. Place a towel between your skin and the bag. If your provider tells  you to keep the assistive device on during icing, place a towel between the assistive device and the bag. Leave the ice on for 20 minutes, 2-3 times a day. If your skin turns bright red, remove the ice or heat right away to prevent skin damage. The risk of damage is higher if you cannot feel pain, heat, or cold. Move your fingers or toes often to reduce stiffness and swelling. Raise (elevate) the affected area above the level of your heart while you are sitting or lying down. General instructions Take over-the-counter and prescription medicines only as told by your provider. Maintain a healthy weight. Follow instructions from your provider for weight control. Do not use any products that contain nicotine or tobacco. These products include cigarettes, chewing tobacco, and vaping devices, such as e-cigarettes. If you need help quitting, ask your provider. Use assistive devices as told by your provider. Where to find more information [employer] of Arthritis and Musculoskeletal and Skin Diseases: niams.http://www.myers.net/ [employer] on Aging: BaseRingTones.pl American College of Rheumatology: rheumatology.org Contact a health care provider if: You have redness, swelling, or a feeling of warmth in a joint that gets worse. You have a fever along with joint or muscle aches. You develop a rash. You have trouble doing your normal activities. You have pain that gets worse and is not relieved by pain medicine. This information is not intended to replace advice given to you by your health care provider. Make sure you discuss any questions you have with your health care provider. Document Revised: 12/18/2021 Document Reviewed: 12/18/2021 Elsevier Patient Education  2024 [employer].

## 2023-07-05 ENCOUNTER — Encounter
Admission: RE | Admit: 2023-07-05 | Discharge: 2023-07-05 | Disposition: A | Discharge status: Home / Self Care | Payer: 59 | Source: Ambulatory Visit | Attending: Obstetrics and Gynecology | Admitting: Obstetrics and Gynecology

## 2023-07-05 DIAGNOSIS — Z01812 Encounter for preprocedural laboratory examination: Secondary | ICD-10-CM | POA: Diagnosis present

## 2023-07-05 LAB — TYPE AND SCREEN
ABO/RH(D): A POS
Antibody Screen: NEGATIVE

## 2023-07-06 ENCOUNTER — Encounter: Payer: Self-pay | Admitting: Obstetrics and Gynecology

## 2023-07-06 NOTE — Progress Notes (Signed)
 Perioperative / Anesthesia Services  Pre-Admission Testing Clinical Review / Pre-Operative Anesthesia Consult  Date: 07/07/23  Patient Demographics:  Name: Jaziyah Gradel DOB: [birth year] MRN:   [id number]  Planned Surgical Procedure(s):    Case: [id number] Date/Time: 07/08/23 0715   Procedure: XI ROBOTIC ASSISTED BILATERAL SALPINGO OOPHORECTOMY, REMOVAL OF ADNEXAL MASS, ABDOMINAL MASS (Bilateral)   Anesthesia type: Choice   Pre-op diagnosis: large adnexal mass   Location: [facility] OR ROOM 07 / [facility]   Surgeons: Christeen Douglas, MD      NOTE: Available PAT nursing documentation and vital signs have been reviewed. Clinical nursing staff has updated patient's PMH/PSHx, current medication list, and drug allergies/intolerances to ensure comprehensive history available to assist in medical decision making as it pertains to the aforementioned surgical procedure and anticipated anesthetic course. Extensive review of available clinical information personally performed. [place] PMH and PSHx updated with any diagnoses/procedures that  may have been inadvertently omitted during his intake with the pre-admission testing department's nursing staff.  Clinical Discussion:  Florance Paolillo is a 53 y.o. female who is submitted for pre-surgical anesthesia review and clearance prior to her undergoing the above procedure. Patient is a Former Smoker (95.5 pack years; quit 05/2022). Pertinent PMH includes: CAD, diastolic dysfunction, RBBB, sinus bradycardia, aortic atherosclerosis, HTN, HLD, DOE, emphysema, adnexal mass, polyarthralgia, medication induced weight gain (40 lb gain with initiation divalproex), depression, suicidal ideations, PTSD, insomnia, substance use/abuse (THC + ETOH).    Patient is followed by cardiology Elease Hashimoto, MD). She was last seen in the cardiology clinic on 05/25/2023; notes reviewed. At the time of her clinic visit, patient doing well overall from a  cardiovascular perspective.  Patient with chronic dyspnea related to her underlying COPD diagnosis. Patient denied any chest pain, PND, orthopnea, palpitations, significant peripheral edema, weakness, fatigue, vertiginous symptoms, or presyncope/syncope. Patient with a past medical history significant for cardiovascular diagnoses. Documented physical exam was grossly benign, providing no evidence of acute exacerbation and/or decompensation of the patient's known cardiovascular conditions.  Most recent TTE was performed on 05/26/2023 revealing a normal left ventricular systolic function with an EF of 55 to 60%.  There were no regional wall motion abnormalities. Left ventricular diastolic Doppler parameters consistent with abnormal relaxation (G1DD).  GLS -20.7%.  Right ventricular size and function normal.  There was trivial mitral and aortic valve regurgitation.  Mild calcification of the aortic valve was observed.All transvalvular gradients were noted to be normal providing no evidence suggestive of valvular stenosis. Aorta normal in size with no evidence of ectasia or aneurysmal dilatation.  Coronary CTA was performed on 06/04/2023 that demonstrated an Agatston coronary artery calcium score of 450. This placed patient in the 99th percentile for age, sex, and race matched controls. Calcium depositions noted to be isolated mainly in the LAD (27.6), LCx (66.1), and RCA (356) distributions.  Study demonstrates normal coronary origin with RIGHT dominance.  Blood pressure well controlled at 122/86 mmHg on currently prescribed CCB (amlodipine), diuretic (HCTZ), ARB (irbesartan), and beta-blocker (metoprolol succinate) therapies.  Patient is on atorvastatin + ezetimibe for her HLD diagnosis and ASCVD prevention. Patient is not diabetic. She does not have an OSAH diagnosis.  Functional capacity is limited by patient's orthopedic pain.  Prior to her knee injury, patient was very active.  Since starting her divalproex  approximately 4 months ago, patient had gained 40 pounds.  Patient with overall deconditioning due to weight gain.  She is able to complete all of her ADLs/IADLs without cardiovascular limitation.  Per the [facility], patient is able to achieve >4 METS of physical activity without experiencing any significant degree of angina/anginal equivalent symptoms. No changes were made to her medication regimen during her visit with cardiology.  Patient scheduled to follow-up with outpatient cardiology in 3-4 months or sooner if needed.  Valeda Corzine is scheduled for an elective XI ROBOTIC ASSISTED BILATERAL SALPINGO OOPHORECTOMY, REMOVAL OF ADNEXAL MASS, ABDOMINAL MASS (Bilateral) on 07/08/2023 with Dr. Christeen Douglas, MD.  Given patient's past medical history significant for cardiovascular diagnoses, presurgical cardiac clearance was sought by the PAT team. Per cardiology, "based ACC/AHA guidelines, the patient's past medical history, and the amount of time since her last clinic visit, this patient would be at an overall LOW risk for the planned procedure without further cardiovascular testing or intervention at this time".   In review of her medication reconciliation, the patient is not noted to be taking any type of anticoagulation or antiplatelet therapies that would need to be held during her perioperative course.   Patient denies previous perioperative complications with anesthesia in the past. In review her EMR, it is noted that patient underwent a general anesthetic course here at [facility] ([facility]) in 11/2021 without documented complications.      07/02/2023    8:03 AM 06/30/2023    2:32 PM 06/03/2023    8:55 AM  Vitals with BMI  Height 5' 6.5" 5\' 7"    Weight 241 lbs 233 lbs   BMI 38.32 36.48   Systolic 120  141  Diastolic 84  94  Pulse 65  66   Providers/Specialists:  NOTE: Primary physician provider listed below. Patient may have been seen by APP or partner  within same practice.   PROVIDER ROLE / SPECIALTY LAST Richardean Chimera, MD OB/GYN (Surgeon) 06/04/2023  Mort Sawyers, FNP Primary Care Provider 06/14/2023  Leodis Sias, MD Cardiology 05/25/2023; preop APP call 07/01/2023  Pollyann Savoy, MD  Rheumatology 07/02/2023  Jomarie Longs, MD Psychiatry 06/04/2023   Allergies:   Allergies  Allergen Reactions   Buspar [Buspirone] Other (See Comments)    Optical migraines   Lisinopril Cough   Morphine Itching and Other (See Comments)    "tried to take her face off due to itching"   Norvasc [Amlodipine] Swelling   Sulfa Antibiotics Other (See Comments)    Passes out   Current Home Medications:   No current facility-administered medications for this encounter.    acetaminophen (TYLENOL) 650 MG CR tablet   albuterol (VENTOLIN HFA) 108 (90 Base) MCG/ACT inhaler   atorvastatin (LIPITOR) 40 MG tablet   B Complex-C (B-COMPLEX WITH VITAMIN C) tablet   Calcium Citrate-Vitamin D (CITRACAL PETITES/VITAMIN D PO)   cyclobenzaprine (FLEXERIL) 10 MG tablet   divalproex (DEPAKOTE ER) 250 MG 24 hr tablet   DULoxetine (CYMBALTA) 20 MG capsule   ezetimibe (ZETIA) 10 MG tablet   hydrochlorothiazide (HYDRODIURIL) 25 MG tablet   hydrOXYzine (ATARAX) 25 MG tablet   ibuprofen (ADVIL) 200 MG tablet   irbesartan (AVAPRO) 300 MG tablet   metoprolol succinate (TOPROL-XL) 50 MG 24 hr tablet   OMEGA-3 FATTY ACIDS PO   potassium chloride (KLOR-CON) 10 MEQ tablet   traZODone (DESYREL) 50 MG tablet   amLODipine (NORVASC) 10 MG tablet   divalproex (DEPAKOTE ER) 500 MG 24 hr tablet   History:   Past Medical History:  Diagnosis Date   Adnexal mass 05/17/2022   a.) CT A/P 05/18/2023: 17.0 x 16.3 x 12.8 cm --> favored  to be a large benign fluid filled ovarian cyst --> surgical resection recommended/pending   Anxiety    Aortic atherosclerosis (HCC)    Bartholin's gland abscess    CAD (coronary artery disease) 06/04/2023   a.) cCTA 06/04/2023:  Ca2+ = 450 (99th %'ile) --> 27.6 LAD, 66.1 LCx, 356 RCA   Chronic left shoulder pain    Depression    Diastolic dysfunction 05/26/2023   a.) TTE 05/26/2023: EF 55-60%, no RWMAs, G1DD, triv AR/MR, mild AoC sclerosis without stenosis   DOE (dyspnea on exertion)    Emphysema lung (HCC)    History of 2019 novel coronavirus disease (COVID-19) 12/13/2020   History of alcohol abuse    History of allergy    History of cannabis abuse    History of suicidal ideation 09/14/2022   a.) "scared to do it"; increasing intrusive thoughts about jumping off a cliff because she is "tired of fighting"; (+) crying, anxiety, depression, inturrupted sleep, appetite changes related to relationship problems (broke up with BF) and what sounds like caregiver role strain (caregiver to very demanding mother); (+) polysubstance use/abuse   HLD (hyperlipidemia)    Hyperlipidemia    Hypertension    Insomnia    a.) uses trazodone PRN   Irritable bowel syndrome with diarrhea    Nephrolithiasis    Pain in both hands    Polyarthralgia    Positive ANA (antinuclear antibody)    PTSD (post-traumatic stress disorder)    RBBB (right bundle branch block)    Sinus bradycardia    Urinary incontinence    Weight gain due to medication    a.) significant (40lb) weight gain with the initiation of divalproex   Past Surgical History:  Procedure Laterality Date   CESAREAN SECTION N/A 1993   CHOLECYSTECTOMY N/A 1996   COLONOSCOPY WITH PROPOFOL N/A 11/18/2021   Procedure: COLONOSCOPY WITH PROPOFOL;  Surgeon: Toney Reil, MD;  Location: [facility] ENDOSCOPY;  Service: Gastroenterology;  Laterality: N/A;   TUBAL LIGATION Bilateral 1995   Family History  Problem Relation Age of Onset   Depression Mother    Alcohol abuse Mother    Heart attack Mother 38   Skin cancer Father    Alcohol abuse Father    Prostate cancer Father    Rheum arthritis Paternal Grandmother    Prostate cancer Paternal Grandfather    Skin cancer  Paternal Grandfather    Asthma Daughter    Mental illness Daughter        borderline   Mitral valve prolapse Daughter        & enlarged aorta   Migraines Daughter    Alcohol abuse Cousin    Healthy Son    Healthy Son    Social History   Tobacco Use   Smoking status: Former    Current packs/day: 0.00    Average packs/day: 1.7 packs/day for 57.0 years (95.5 ttl pk-yrs)    Types: Cigarettes    Start date: 2004    Quit date: 2024    Years since quitting: 1.1    Passive exposure: Current   Smokeless tobacco: Never   Tobacco comments:    Pt smokes two packs cigarettes for the past 20 yrs, wants 14 mg patch  Substance Use Topics   Alcohol use: Not Currently    Comment: h/o alcoholism quit 2017   Pertinent Clinical Results:  LABS:  Lab Results  Component Value Date   WBC 6.5 05/17/2023   HGB 15.0 05/17/2023   HCT 43.9 05/17/2023  MCV 89.1 05/17/2023   PLT 171.0 05/17/2023   Lab Results  Component Value Date   NA 139 06/08/2023   CL 99 06/08/2023   K 4.2 06/08/2023   CO2 29 06/08/2023   BUN 13 06/08/2023   CREATININE 0.62 06/14/2023   EGFR 106 06/14/2023   CALCIUM 9.0 06/08/2023   ALBUMIN 4.3 05/17/2023   GLUCOSE 105 (H) 06/08/2023    ECG: Date: 05/17/2023  Time ECG obtained: 1229 PM Rate: 67 bpm Rhythm:  sinus rhythm Axis (leads I and aVF): normal Intervals: PR 160 ms. QRS 120 ms. QTc 408 ms. ST segment and T wave changes: Non-specific ST/T waves changes in leads III,avF, V2-V3 Evidence of a possible, age undetermined, prior infarct:  No Comparison: Similar to previous tracing obtained on 12/16/2022   IMAGING / PROCEDURES: [place] FDC PELVIC TRANSVAGINAL performed on 06/04/2023 The sonogram reveals a uterus with small intramural fibroid. The endometrium appears normal.  Left ovary was visualized and appears within normal limits.  17x15 cm right adnexal mass is noted.  Multilocular cyst, smooth internal wall, acoustic shadows not present, complete septations,  color score 1 (no color).  Size 171.0 mm x 129.0 mm x 166.0 mm. Mean 155.3 mm. Vol 1,917.311 cm.  Doppler: no detectable blood flow   CT CARDIAC SCORING performed on 06/04/2023 Coronary calcium score of 450. This was 99th percentile for age and sex matched control. CAC >300 in LAD, LCx, RCA. CAC-DRS A3/N3. No significant extracardiac findings Recommend aspirin and statin if no contraindication. Recommend cardiology consultation. Continue heart healthy lifestyle and risk factor modification.  TRANSTHORACIC ECHOCARDIOGRAM performed on 05/26/2023 Left ventricular ejection fraction, by estimation, is 55 to 60%. The left ventricle has normal function. The left ventricle has no regional wall motion abnormalities. Left ventricular diastolic parameters are consistent with Grade I diastolic dysfunction (impaired relaxation). The average left ventricular global longitudinal strain is -20.7 %. The global longitudinal strain is normal.  Right ventricular systolic function is normal. The right ventricular size is normal.  The mitral valve is normal in structure. Trivial mitral valve regurgitation. No evidence of mitral stenosis.  The aortic valve is tricuspid. There is mild calcification of the aortic valve. Aortic valve regurgitation is trivial. Aortic valve sclerosis/calcification is present, without any evidence of aortic stenosis.  The inferior vena cava is normal in size with greater than 50% respiratory variability, suggesting right atrial pressure of 3 mmHg.   CT ABDOMEN PELVIS W CONTRAST performed on 05/18/2023 Large complex cystic lesion centered just above the uterus in the central pelvis measuring 17.0 x 16.3 x 12.8 cm. Although this has touching several structures this very well could be an ovarian cystic neoplasm. Recommend surgical consultation. No ascites, free air or nodal enlargement. No bowel obstruction.   Normal appendix. Punctate nonobstructing left-sided renal stone  Impression  and Plan:  Mckynleigh Mussell has been referred for pre-anesthesia review and clearance prior to her undergoing the planned anesthetic and procedural courses. Available labs, pertinent testing, and imaging results were personally reviewed by me in preparation for upcoming operative/procedural course. [facility] medical record has been updated following extensive record review and patient interview with PAT staff.   This patient has been appropriately cleared by cardiology with an overall LOW risk of experiencing significant perioperative cardiovascular complications. Based on clinical review performed today (07/07/23), barring any significant acute changes in the patient's overall condition, it is anticipated that she will be able to proceed with the planned surgical intervention. Any acute changes in clinical condition may  necessitate her procedure being postponed and/or cancelled. Patient will meet with anesthesia team (MD and/or CRNA) on the day of her procedure for preoperative evaluation/assessment. Questions regarding anesthetic course will be fielded at that time.   Pre-surgical instructions were reviewed with the patient during his PAT appointment, and questions were fielded to satisfaction by PAT clinical staff. She has been instructed on which medications that she will need to hold prior to surgery, as well as the ones that have been deemed safe/appropriate to take on the day of his procedure. As part of the general education provided by PAT, patient made aware both verbally and in writing, that she would need to abstain from the use of any illegal substances during his perioperative course. She was advised that failure to follow the provided instructions could necessitate case cancellation or result in serious perioperative complications up to and including death. Patient encouraged to contact PAT and/or her surgeon's office to discuss any questions or concerns that may arise prior to surgery;  verbalized understanding.   Quentin Mulling, MSN, APRN, FNP-C, CEN [facility]  Perioperative Services Nurse Practitioner Phone: [phone] Fax: [phone] 07/07/23 3:12 PM  NOTE: This note has been prepared using Dragon dictation software. Despite my best ability to proofread, there is always the potential that unintentional transcriptional errors may still occur from this process.

## 2023-07-06 NOTE — H&P (Signed)
 Sandra Montes is a 53 y.o. [phone] female here for Follow-up (U/s follow up)   Referring provider: Christy Gentles*   History of Present Illness: U/S today   Right side to midline multilocular cyst, smooth internal wall, acoustic shadows not present, complete septations, color score 1 (no color) History of Present Illness The patient is eager to have the cyst removed. The patient's ultrasound and CT scan results are consistent, showing a large cyst filled with fluid. The patient's labs were normal and the Pap smear was also normal. The patient has been experiencing hot flashes and trouble sleeping since she was 41. The patient's lab results confirmed she is in menopause. The patient also has a cyst on one kidney and a stone in the other kidney. The patient has a history of smoking but quit 24 years ago.     Component     Latest Ref Rng 05/19/2023 Diagnostic Interpretation Comment  Specimen adequacy: - [facility] Comment  Clinician provided ICD10: - [facility] Comment  PERFORMED BY: - [facility] Comment  . - [facility] .  Note: - [facility] Comment  Test Method MT21 - [facility] Comment  HPV APTIMA - [facility]     Negative  Negative  LH - [facility]     mIU/mL 41.9  FSH - [facility]     mIU/mL 50.6  Estradiol - [facility]     pg/mL 43.9  Cancer Antigen (CA) 125 - [facility]     0.0 - 38.1 U/mL 16.4  HE4 - [facility]     0.0 - 105.2 pmol/L 71.8         CT AP 05/18/23 for weight gain Large complex cystic lesion centered just above the uterus in the  central pelvis measuring 17.0 x 16.3 x 12.8 cm. Although this has  touching several structures this very well could be an ovarian  cystic neoplasm. Recommend surgical consultation.    Pertinent Hx: -S/p BTL -1 C/S and 1 SVD - 3 babies, twin pregnancy -Recurring vulvar cysts  -HTN -COPD    Past Medical History:  has a past medical history of Allergy, Anxiety, Bartholin's gland abscess, COVID-19, Hyperlipidemia, Hypertension, and Urinary incontinence.   Past Surgical History:  has a past surgical history that includes Tubal ligation. Family History: family history is not on file. Social History:  reports that she has been smoking. She has never used smokeless tobacco. She reports that she does not currently use alcohol. She reports that she does not use drugs. OB/GYN History:  OB History       Gravida 5   Para 2   Term 2   Preterm     AB 3   Living 3       SAB     IAB 3   Ectopic     Molar     Multiple 1   Live Births          Allergies: is allergic to buspirone, lisinopril, morphine, and sulfa (sulfonamide antibiotics). Medications: Current Medications Current Outpatient Medications:    atorvastatin (LIPITOR) 40 MG tablet, Take 40 mg by mouth once daily., Disp: , Rfl:    b complex vitamins capsule, Take 1 capsule by mouth once daily, Disp: , Rfl:    cetirizine (ZYRTEC) 10 MG tablet, Take 10 mg by mouth once daily., Disp: , Rfl:    cyclobenzaprine (FLEXERIL) 10 MG tablet, Take 10 mg by mouth 2 (two) times daily as needed, Disp: , Rfl:    divalproex (DEPAKOTE ER) 250 MG ER tablet, Take  250 mg by mouth, Disp: , Rfl:    divalproex (DEPAKOTE ER) 500 MG ER tablet, Take 500 mg by mouth, Disp: , Rfl:    DULoxetine (CYMBALTA) 20 MG DR capsule, Take 20 mg by mouth 2 (two) times daily, Disp: , Rfl:    hydroCHLOROthiazide (HYDRODIURIL) 25 MG tablet, Take 1 tablet by mouth once daily, Disp: , Rfl:    hydrOXYzine (ATARAX) 25 MG tablet, Take 25 mg by mouth 3 (three) times daily as needed, Disp: , Rfl:    irbesartan (AVAPRO) 300 MG tablet, Take 1 tablet by mouth once daily, Disp: , Rfl:    metoprolol succinate (TOPROL-XL) 50 MG XL tablet, Take 50 mg by mouth once daily., Disp: , Rfl:    omega-3-dha-epa-fish oil 300-1,000 mg capsule, Take 2 g by mouth once daily, Disp: , Rfl:    potassium chloride (KLOR-CON) 10 MEQ ER tablet, Take 10 mEq by mouth 2 (two) times daily, Disp: , Rfl:    tobramycin (TOBREX) 0.3 %  ophthalmic solution, , Disp: , Rfl:    traZODone (DESYREL) 50 MG tablet, Take 50 mg by mouth at bedtime as needed, Disp: , Rfl:    albuterol 90 mcg/actuation inhaler, 2 puffs q.i.d. p.r.n. short of breath, wheezing, or cough (Patient not taking: Reported on 06/04/2023), Disp: 1 Inhaler, Rfl: 0   ALPRAZolam (XANAX) 1 MG tablet, Take 1 mg by mouth as needed for Sleep. (Patient not taking: Reported on 06/04/2023), Disp: , Rfl:    amLODIPine (NORVASC) 10 MG tablet, , Disp: , Rfl:    buPROPion (WELLBUTRIN XL) 300 MG XL tablet, Take 300 mg by mouth once daily. (Patient not taking: Reported on 06/04/2023), Disp: , Rfl:    desvenlafaxine succinate (PRISTIQ) 50 MG ER tablet, Take 50 mg by mouth once daily. (Patient not taking: Reported on 06/04/2023), Disp: , Rfl:    hydrocodone-chlorpheniramine (TUSSIONEX) 10-8 mg/5 mL ER suspension, Take 5 mLs by mouth every 12 (twelve) hours as needed for Cough. (Patient not taking: Reported on 06/04/2023), Disp: 120 mL, Rfl: 0   lidocaine-prilocaine (EMLA) cream, Apply topically as needed To affected areas (Patient not taking: Reported on 06/04/2023), Disp: 30 g, Rfl: 1   predniSONE (DELTASONE) 10 MG tablet, 6 PO Q D X 1 DAY, THEN 5 PO Q D X 1 DAY, THEN 4 PO Q D X 1 DAY, THEN 3 PO Q D X 1 DAY, THEN 2 PO Q D X 1 DAY, THEN 1 PO Q D X 1 DAY (Patient not taking: Reported on 06/04/2023), Disp: 21 tablet, Rfl: 0   valsartan-hydrochlorothiazide (DIOVAN-HCT) 320-25 mg tablet, Take 1 tablet by mouth once daily. (Patient not taking: Reported on 06/04/2023), Disp: , Rfl:     Review of Systems: No SOB, no palpitations or chest pain, no new lower extremity edema, no nausea or vomiting or bowel or bladder complaints. See HPI for gyn specific ROS.    Exam:   BP 120/86   Ht 168.9 cm (5' 6.5")   Wt (!) 104.5 kg (230 lb 6.4 oz)   LMP 04/21/2023 (Exact Date)   BMI 36.63 kg/m       Constitutional:  General appearance: Well nourished, well developed female in no acute distress.   Neuro/psych:  Normal mood and affect. No gross motor deficits. CV: RRR Pulm: CTAB Neck:  Supple, normal appearance.  Respiratory:  Normal respiratory effort, no use of accessory muscles Skin:  No visible rashes or external lesions       Impression:   The primary  encounter diagnosis was Right adnexal tenderness. A diagnosis of Lesion of right ovary was also pertinent to this visit.   Plan:   - Large Adnexal Mass:  Assessment & Plan Ovarian Cyst Large fluid-filled ovarian cyst, not suspected to be malignant based on ultrasound and CT scan findings. Labs and Pap smear normal. Patient experiencing discomfort. -Plan for laparoscopic removal of the cyst and both ovaries due to patient being in menopause. -Consideration for hysterectomy as per patient's preference. -Surgical scheduler to contact patient to arrange procedure at her convenience. -If any unusual symptoms occur before surgery, patient advised to call or come in.   Menopause Confirmed by lab results. Patient has been experiencing hot flashes and trouble sleeping since age 63. -Post-surgery, consider hormone replacement therapy if hot flashes worsen.   Kidney Abnormalities Patient reports high protein creatinine ratio in recent lab results, has known cyst on one kidney and stone in the other. -No specific plan discussed in this conversation.   Post-Surgery Recovery Discussed expected recovery timeline and return to work (remotely) after surgery. -Expect 24 hours of resting, 3 days of being careful and taking pain meds, and intermittent rest and cramping pain thereafter. -Post-surgery follow-up to be scheduled.   Patient Lifestyle Patient wishes to return to motorcycle riding post-surgery. -Advised to wait at least 2 weeks post-surgery before resuming motorcycle riding.     She has had a C/S and a BTL    Diagnoses and all orders for this visit:   Right adnexal tenderness   Lesion of right ovary

## 2023-07-08 ENCOUNTER — Encounter
Admission: RE | Disposition: A | Discharge status: Home / Self Care | Payer: Self-pay | Source: Ambulatory Visit | Attending: Obstetrics and Gynecology

## 2023-07-08 ENCOUNTER — Encounter: Payer: Self-pay | Admitting: Obstetrics and Gynecology

## 2023-07-08 ENCOUNTER — Ambulatory Visit
Admission: RE | Admit: 2023-07-08 | Discharge: 2023-07-08 | Disposition: A | Discharge status: Home / Self Care | Payer: 59 | Source: Ambulatory Visit | Attending: Obstetrics and Gynecology | Admitting: Obstetrics and Gynecology

## 2023-07-08 ENCOUNTER — Other Ambulatory Visit: Payer: Self-pay

## 2023-07-08 ENCOUNTER — Ambulatory Visit: Payer: Self-pay | Admitting: Urgent Care

## 2023-07-08 DIAGNOSIS — I700 Atherosclerosis of aorta: Secondary | ICD-10-CM | POA: Diagnosis not present

## 2023-07-08 DIAGNOSIS — R1900 Intra-abdominal and pelvic swelling, mass and lump, unspecified site: Secondary | ICD-10-CM | POA: Diagnosis present

## 2023-07-08 DIAGNOSIS — E785 Hyperlipidemia, unspecified: Secondary | ICD-10-CM | POA: Insufficient documentation

## 2023-07-08 DIAGNOSIS — R140 Abdominal distension (gaseous): Secondary | ICD-10-CM

## 2023-07-08 DIAGNOSIS — R7989 Other specified abnormal findings of blood chemistry: Secondary | ICD-10-CM | POA: Diagnosis not present

## 2023-07-08 DIAGNOSIS — F419 Anxiety disorder, unspecified: Secondary | ICD-10-CM | POA: Diagnosis not present

## 2023-07-08 DIAGNOSIS — I2510 Atherosclerotic heart disease of native coronary artery without angina pectoris: Secondary | ICD-10-CM | POA: Diagnosis not present

## 2023-07-08 DIAGNOSIS — F4310 Post-traumatic stress disorder, unspecified: Secondary | ICD-10-CM | POA: Insufficient documentation

## 2023-07-08 DIAGNOSIS — Z6838 Body mass index (BMI) 38.0-38.9, adult: Secondary | ICD-10-CM | POA: Diagnosis not present

## 2023-07-08 DIAGNOSIS — N200 Calculus of kidney: Secondary | ICD-10-CM | POA: Insufficient documentation

## 2023-07-08 DIAGNOSIS — J439 Emphysema, unspecified: Secondary | ICD-10-CM | POA: Insufficient documentation

## 2023-07-08 DIAGNOSIS — N281 Cyst of kidney, acquired: Secondary | ICD-10-CM | POA: Insufficient documentation

## 2023-07-08 DIAGNOSIS — I10 Essential (primary) hypertension: Secondary | ICD-10-CM | POA: Insufficient documentation

## 2023-07-08 DIAGNOSIS — F32A Depression, unspecified: Secondary | ICD-10-CM | POA: Diagnosis not present

## 2023-07-08 DIAGNOSIS — D271 Benign neoplasm of left ovary: Secondary | ICD-10-CM | POA: Insufficient documentation

## 2023-07-08 DIAGNOSIS — I4510 Unspecified right bundle-branch block: Secondary | ICD-10-CM | POA: Diagnosis not present

## 2023-07-08 DIAGNOSIS — G4700 Insomnia, unspecified: Secondary | ICD-10-CM | POA: Diagnosis not present

## 2023-07-08 DIAGNOSIS — R232 Flushing: Secondary | ICD-10-CM | POA: Diagnosis not present

## 2023-07-08 DIAGNOSIS — N951 Menopausal and female climacteric states: Secondary | ICD-10-CM | POA: Diagnosis not present

## 2023-07-08 DIAGNOSIS — E669 Obesity, unspecified: Secondary | ICD-10-CM | POA: Insufficient documentation

## 2023-07-08 DIAGNOSIS — Z01812 Encounter for preprocedural laboratory examination: Secondary | ICD-10-CM

## 2023-07-08 DIAGNOSIS — Z9851 Tubal ligation status: Secondary | ICD-10-CM | POA: Diagnosis not present

## 2023-07-08 DIAGNOSIS — T426X5A Adverse effect of other antiepileptic and sedative-hypnotic drugs, initial encounter: Secondary | ICD-10-CM | POA: Diagnosis not present

## 2023-07-08 HISTORY — PX: ABDOMINAL HYSTERECTOMY: SUR658

## 2023-07-08 HISTORY — DX: Calculus of kidney: N20.0

## 2023-07-08 HISTORY — DX: Adverse effect of unspecified drugs, medicaments and biological substances, initial encounter: T50.905A

## 2023-07-08 HISTORY — DX: Insomnia, unspecified: G47.00

## 2023-07-08 HISTORY — DX: Atherosclerosis of aorta: I70.0

## 2023-07-08 HISTORY — DX: Abnormal weight gain: R63.5

## 2023-07-08 HISTORY — DX: Unspecified right bundle-branch block: I45.10

## 2023-07-08 HISTORY — DX: Bradycardia, unspecified: R00.1

## 2023-07-08 HISTORY — PX: ROBOTIC ASSISTED BILATERAL SALPINGO OOPHERECTOMY: SHX6078

## 2023-07-08 LAB — CBC
HCT: 39.5 % (ref 36.0–46.0)
Hemoglobin: 13.6 g/dL (ref 12.0–15.0)
MCH: 30.1 pg (ref 26.0–34.0)
MCHC: 34.4 g/dL (ref 30.0–36.0)
MCV: 87.4 fL (ref 80.0–100.0)
Platelets: 151 10*3/uL (ref 150–400)
RBC: 4.52 MIL/uL (ref 3.87–5.11)
RDW: 13 % (ref 11.5–15.5)
WBC: 6.9 10*3/uL (ref 4.0–10.5)
nRBC: 0 % (ref 0.0–0.2)

## 2023-07-08 LAB — ABO/RH: ABO/RH(D): A POS

## 2023-07-08 SURGERY — SALPINGO-OOPHORECTOMY, BILATERAL, ROBOT-ASSISTED
Anesthesia: General | Site: Abdomen | Laterality: Bilateral

## 2023-07-08 MED ORDER — 0.9 % SODIUM CHLORIDE (POUR BTL) OPTIME
TOPICAL | Status: DC | PRN
  Administered 2023-07-08: 500 mL

## 2023-07-08 MED ORDER — CHLORHEXIDINE GLUCONATE 0.12 % MT SOLN
OROMUCOSAL | Status: AC
  Filled 2023-07-08: qty 15

## 2023-07-08 MED ORDER — DEXAMETHASONE SODIUM PHOSPHATE 10 MG/ML IJ SOLN
INTRAMUSCULAR | Status: DC | PRN
  Administered 2023-07-08: 10 mg via INTRAVENOUS

## 2023-07-08 MED ORDER — OXYCODONE HCL 5 MG PO TABS
5.0000 mg | ORAL_TABLET | Freq: Once | ORAL | Status: AC | PRN
  Administered 2023-07-08: 5 mg via ORAL

## 2023-07-08 MED ORDER — DOCUSATE SODIUM 100 MG PO CAPS: 100.0000 mg | ORAL_CAPSULE | Freq: Two times a day (BID) | ORAL | 0 refills | Status: DC

## 2023-07-08 MED ORDER — ONDANSETRON HCL 4 MG/2ML IJ SOLN: 4.0000 mg | Freq: Once | INTRAMUSCULAR | Status: DC | PRN

## 2023-07-08 MED ORDER — HEMOSTATIC AGENTS (NO CHARGE) OPTIME
TOPICAL | Status: DC | PRN
  Administered 2023-07-08: 1

## 2023-07-08 MED ORDER — EPHEDRINE SULFATE-NACL 50-0.9 MG/10ML-% IV SOSY
PREFILLED_SYRINGE | INTRAVENOUS | Status: DC | PRN
  Administered 2023-07-08 (×3): 5 mg via INTRAVENOUS
  Administered 2023-07-08: 10 mg via INTRAVENOUS

## 2023-07-08 MED ORDER — SUGAMMADEX SODIUM 200 MG/2ML IV SOLN
INTRAVENOUS | Status: DC | PRN
  Administered 2023-07-08: 200 mg via INTRAVENOUS

## 2023-07-08 MED ORDER — OXYCODONE HCL 5 MG/5ML PO SOLN: 5.0000 mg | Freq: Once | ORAL | Status: AC | PRN

## 2023-07-08 MED ORDER — SODIUM CHLORIDE 0.9 % IR SOLN
Status: DC | PRN
  Administered 2023-07-08: 1000 mL

## 2023-07-08 MED ORDER — GABAPENTIN 300 MG PO CAPS
ORAL_CAPSULE | ORAL | Status: AC
  Filled 2023-07-08: qty 1

## 2023-07-08 MED ORDER — FENTANYL CITRATE (PF) 100 MCG/2ML IJ SOLN
INTRAMUSCULAR | Status: DC | PRN
  Administered 2023-07-08 (×2): 50 ug via INTRAVENOUS

## 2023-07-08 MED ORDER — MIDAZOLAM HCL 5 MG/5ML IJ SOLN
INTRAMUSCULAR | Status: DC | PRN
  Administered 2023-07-08: 2 mg via INTRAVENOUS

## 2023-07-08 MED ORDER — GABAPENTIN 300 MG PO CAPS
300.0000 mg | ORAL_CAPSULE | ORAL | Status: AC
  Administered 2023-07-08: 300 mg via ORAL

## 2023-07-08 MED ORDER — LIDOCAINE HCL (CARDIAC) PF 100 MG/5ML IV SOSY
PREFILLED_SYRINGE | INTRAVENOUS | Status: DC | PRN
  Administered 2023-07-08: 100 mg via INTRAVENOUS

## 2023-07-08 MED ORDER — PHENYLEPHRINE 80 MCG/ML (10ML) SYRINGE FOR IV PUSH (FOR BLOOD PRESSURE SUPPORT)
PREFILLED_SYRINGE | INTRAVENOUS | Status: DC | PRN
  Administered 2023-07-08 (×3): 80 ug via INTRAVENOUS
  Administered 2023-07-08 (×2): 160 ug via INTRAVENOUS

## 2023-07-08 MED ORDER — ROCURONIUM BROMIDE 100 MG/10ML IV SOLN
INTRAVENOUS | Status: DC | PRN
  Administered 2023-07-08: 20 mg via INTRAVENOUS
  Administered 2023-07-08: 50 mg via INTRAVENOUS
  Administered 2023-07-08: 20 mg via INTRAVENOUS

## 2023-07-08 MED ORDER — PROPOFOL 10 MG/ML IV BOLUS
INTRAVENOUS | Status: DC | PRN
  Administered 2023-07-08: 150 mg via INTRAVENOUS

## 2023-07-08 MED ORDER — FENTANYL CITRATE (PF) 100 MCG/2ML IJ SOLN
INTRAMUSCULAR | Status: AC
  Filled 2023-07-08: qty 2

## 2023-07-08 MED ORDER — PHENYLEPHRINE HCL-NACL 20-0.9 MG/250ML-% IV SOLN
INTRAVENOUS | Status: DC | PRN
  Administered 2023-07-08: 40 ug/min via INTRAVENOUS

## 2023-07-08 MED ORDER — LACTATED RINGERS IV SOLN: INTRAVENOUS | Status: DC

## 2023-07-08 MED ORDER — KETAMINE HCL 50 MG/5ML IJ SOSY
PREFILLED_SYRINGE | INTRAMUSCULAR | Status: DC | PRN
  Administered 2023-07-08: 30 mg via INTRAVENOUS
  Administered 2023-07-08 (×2): 10 mg via INTRAVENOUS

## 2023-07-08 MED ORDER — CHLORHEXIDINE GLUCONATE 0.12 % MT SOLN
15.0000 mL | Freq: Once | OROMUCOSAL | Status: AC
  Administered 2023-07-08: 15 mL via OROMUCOSAL

## 2023-07-08 MED ORDER — BUPIVACAINE HCL (PF) 0.5 % IJ SOLN
INTRAMUSCULAR | Status: DC | PRN
  Administered 2023-07-08: 13 mL

## 2023-07-08 MED ORDER — OXYCODONE HCL 5 MG PO TABS: 5.0000 mg | ORAL_TABLET | ORAL | 0 refills | Status: DC | PRN

## 2023-07-08 MED ORDER — ORAL CARE MOUTH RINSE: 15.0000 mL | Freq: Once | OROMUCOSAL | Status: AC

## 2023-07-08 MED ORDER — POVIDONE-IODINE 10 % EX SWAB
2.0000 | Freq: Once | CUTANEOUS | Status: AC
  Administered 2023-07-08: 2 via TOPICAL

## 2023-07-08 MED ORDER — OXYCODONE HCL 5 MG PO TABS
ORAL_TABLET | ORAL | Status: AC
  Filled 2023-07-08: qty 1

## 2023-07-08 MED ORDER — ACETAMINOPHEN 10 MG/ML IV SOLN: 1000.0000 mg | Freq: Once | INTRAVENOUS | Status: DC | PRN

## 2023-07-08 MED ORDER — BUPIVACAINE HCL (PF) 0.5 % IJ SOLN
INTRAMUSCULAR | Status: AC
  Filled 2023-07-08: qty 30

## 2023-07-08 MED ORDER — KETAMINE HCL 50 MG/5ML IJ SOSY
PREFILLED_SYRINGE | INTRAMUSCULAR | Status: AC
  Filled 2023-07-08: qty 5

## 2023-07-08 MED ORDER — VASOPRESSIN 20 UNIT/ML IV SOLN
INTRAVENOUS | Status: DC | PRN
  Administered 2023-07-08 (×4): 1 [IU] via INTRAVENOUS

## 2023-07-08 MED ORDER — PHENYLEPHRINE HCL-NACL 20-0.9 MG/250ML-% IV SOLN
INTRAVENOUS | Status: AC
  Filled 2023-07-08: qty 250

## 2023-07-08 MED ORDER — FENTANYL CITRATE (PF) 100 MCG/2ML IJ SOLN
25.0000 ug | INTRAMUSCULAR | Status: DC | PRN
  Administered 2023-07-08 (×2): 50 ug via INTRAVENOUS

## 2023-07-08 MED ORDER — ACETAMINOPHEN 500 MG PO TABS
1000.0000 mg | ORAL_TABLET | ORAL | Status: AC
  Administered 2023-07-08: 1000 mg via ORAL

## 2023-07-08 MED ORDER — ACETAMINOPHEN 500 MG PO TABS
ORAL_TABLET | ORAL | Status: AC
  Filled 2023-07-08: qty 2

## 2023-07-08 MED ORDER — ACETAMINOPHEN EXTRA STRENGTH 500 MG PO TABS: 1000.0000 mg | ORAL_TABLET | Freq: Four times a day (QID) | ORAL | 0 refills | Status: AC

## 2023-07-08 MED ORDER — MIDAZOLAM HCL 2 MG/2ML IJ SOLN
INTRAMUSCULAR | Status: AC
  Filled 2023-07-08: qty 2

## 2023-07-08 MED ORDER — PROPOFOL 10 MG/ML IV BOLUS
INTRAVENOUS | Status: AC
  Filled 2023-07-08: qty 20

## 2023-07-08 MED ORDER — LACTATED RINGERS IV SOLN
INTRAVENOUS | Status: DC
Start: 2023-07-08 — End: 2023-07-08

## 2023-07-08 MED ORDER — ONDANSETRON HCL 4 MG/2ML IJ SOLN
INTRAMUSCULAR | Status: DC | PRN
Start: 2023-07-08 — End: 2023-07-08
  Administered 2023-07-08: 4 mg via INTRAVENOUS

## 2023-07-08 MED ORDER — CELECOXIB 200 MG PO CAPS
200.0000 mg | ORAL_CAPSULE | Freq: Two times a day (BID) | ORAL | 0 refills | Status: AC
Start: 2023-07-08 — End: 2023-07-11

## 2023-07-08 MED ORDER — GABAPENTIN 300 MG PO CAPS
300.0000 mg | ORAL_CAPSULE | Freq: Every day | ORAL | 0 refills | Status: AC
Start: 2023-07-08 — End: 2023-07-22

## 2023-07-08 SURGICAL SUPPLY — 61 items
APPLICATOR ARISTA FLEXITIP XL (MISCELLANEOUS) IMPLANT
BAG LAPAROSCOPIC 12 15 PORT 16 (BASKET) IMPLANT
BAG RETRIEVAL 12/15 (BASKET) ×1 IMPLANT
BAG URINE DRAIN 2000ML AR STRL (UROLOGICAL SUPPLIES) ×1 IMPLANT
BLADE SURG SZ11 CARB STEEL (BLADE) ×1 IMPLANT
CATH URTH 16FR FL 2W BLN LF (CATHETERS) ×1 IMPLANT
COUNTER NDL MAGNETIC 40 RED (SET/KITS/TRAYS/PACK) ×1 IMPLANT
COUNTER NEEDLE MAGNETIC 40 RED (SET/KITS/TRAYS/PACK) ×1 IMPLANT
COVER TIP SHEARS 8 DVNC (MISCELLANEOUS) ×1 IMPLANT
COVER WAND RF STERILE (DRAPES) ×1 IMPLANT
DERMABOND ADVANCED .7 DNX12 (GAUZE/BANDAGES/DRESSINGS) ×1 IMPLANT
DERMABOND ADVANCED .7 DNX6 (GAUZE/BANDAGES/DRESSINGS) IMPLANT
DRAPE ARM DVNC X/XI (DISPOSABLE) ×3 IMPLANT
DRAPE COLUMN DVNC XI (DISPOSABLE) ×1 IMPLANT
DRAPE SHEET LG 3/4 BI-LAMINATE (DRAPES) ×1 IMPLANT
DRSG TEGADERM 4X4.75 (GAUZE/BANDAGES/DRESSINGS) IMPLANT
ELECT REM PT RETURN 9FT ADLT (ELECTROSURGICAL) ×1 IMPLANT
ELECTRODE REM PT RTRN 9FT ADLT (ELECTROSURGICAL) ×1 IMPLANT
FORCEPS BPLR FENES DVNC XI (FORCEP) ×1 IMPLANT
GLOVE BIO SURGEON STRL SZ7 (GLOVE) ×4 IMPLANT
GLOVE INDICATOR 7.5 STRL GRN (GLOVE) ×4 IMPLANT
GOWN STRL REUS W/ TWL LRG LVL3 (GOWN DISPOSABLE) ×4 IMPLANT
GRASPER SUT TROCAR 14GX15 (MISCELLANEOUS) ×1 IMPLANT
HEMOSTAT ARISTA ABSORB 3G PWDR (HEMOSTASIS) IMPLANT
IRRIGATION STRYKERFLOW (MISCELLANEOUS) IMPLANT
IRRIGATOR STRYKERFLOW (MISCELLANEOUS) ×1 IMPLANT
IRRIGATOR SUCT 8 DISP DVNC XI (IRRIGATION / IRRIGATOR) IMPLANT
IV NS 1000ML BAXH (IV SOLUTION) IMPLANT
KIT PINK PAD W/HEAD ARE REST (MISCELLANEOUS) ×1 IMPLANT
KIT PINK PAD W/HEAD ARM REST (MISCELLANEOUS) ×1 IMPLANT
LABEL OR SOLS (LABEL) ×1 IMPLANT
MANIFOLD NEPTUNE II (INSTRUMENTS) ×1 IMPLANT
MANIPULATOR UTERINE 4.5 ZUMI (MISCELLANEOUS) ×1 IMPLANT
NDL DRIVE SUT CUT DVNC (INSTRUMENTS) ×1 IMPLANT
NEEDLE DRIVE SUT CUT DVNC (INSTRUMENTS) ×1 IMPLANT
NS IRRIG 1000ML POUR BTL (IV SOLUTION) ×1 IMPLANT
OBTURATOR OPTICAL STND 8 DVNC (TROCAR) ×1 IMPLANT
OBTURATOR OPTICALSTD 8 DVNC (TROCAR) ×1 IMPLANT
PACK GYN LAPAROSCOPIC (MISCELLANEOUS) ×1 IMPLANT
PAD OB MATERNITY 11 LF (PERSONAL CARE ITEMS) ×1 IMPLANT
PAD PREP OB/GYN DISP 24X41 (PERSONAL CARE ITEMS) ×1 IMPLANT
SCISSORS MNPLR CVD DVNC XI (INSTRUMENTS) ×1 IMPLANT
SCRUB CHG 4% DYNA-HEX 4OZ (MISCELLANEOUS) ×1 IMPLANT
SEAL UNIV 5-12 XI (MISCELLANEOUS) ×3 IMPLANT
SEALER VESSEL EXT DVNC XI (MISCELLANEOUS) IMPLANT
SET CYSTO W/LG BORE CLAMP LF (SET/KITS/TRAYS/PACK) IMPLANT
SET TUBE SMOKE EVAC HIGH FLOW (TUBING) ×1 IMPLANT
SOL ELECTROSURG ANTI STICK (MISCELLANEOUS) ×1 IMPLANT
SOL PREP PVP 2OZ (MISCELLANEOUS) ×1 IMPLANT
SOLUTION ELECTROSURG ANTI STCK (MISCELLANEOUS) ×1 IMPLANT
SOLUTION PREP PVP 2OZ (MISCELLANEOUS) ×1 IMPLANT
SURGILUBE 2OZ TUBE FLIPTOP (MISCELLANEOUS) ×1 IMPLANT
SUT MNCRL 4-0 27 PS-2 XMFL (SUTURE) ×2 IMPLANT
SUT STRATA 2-0 30 CT-2 (SUTURE) IMPLANT
SUT VIC AB 0 CT2 27 (SUTURE) ×2 IMPLANT
SUT VICRYL 0 UR6 27IN ABS (SUTURE) IMPLANT
SUTURE MNCRL 4-0 27XMF (SUTURE) ×1 IMPLANT
SYR 50ML LL SCALE MARK (SYRINGE) ×1 IMPLANT
SYS TROCAR 1.5-3 SLV ABD GEL (ENDOMECHANICALS) ×1 IMPLANT
SYSTEM TROCR 1.5-3 SLV ABD GEL (ENDOMECHANICALS) IMPLANT
WATER STERILE IRR 500ML POUR (IV SOLUTION) ×1 IMPLANT

## 2023-07-08 NOTE — Anesthesia Procedure Notes (Signed)
 Procedure Name: Intubation Date/Time: 07/08/2023 7:39 AM  Performed by: Cheral Bay, CRNAPre-anesthesia Checklist: Patient identified, Emergency Drugs available, Suction available and Patient being monitored Patient Re-evaluated:Patient Re-evaluated prior to induction Oxygen Delivery Method: Circle system utilized Preoxygenation: Pre-oxygenation with 100% oxygen Induction Type: IV induction Ventilation: Mask ventilation without difficulty Laryngoscope Size: McGrath and 3 Grade View: Grade II Tube type: Oral Number of attempts: 1 Airway Equipment and Method: Stylet Placement Confirmation: ETT inserted through vocal cords under direct vision, positive ETCO2 and breath sounds checked- equal and bilateral Secured at: 20 cm Tube secured with: Tape Dental Injury: Teeth and Oropharynx as per pre-operative assessment

## 2023-07-08 NOTE — Anesthesia Preprocedure Evaluation (Addendum)
 Anesthesia Evaluation  Patient identified by MRN, date of birth, ID band Patient awake    Reviewed: Allergy & Precautions, NPO status , Patient's Chart, lab work & pertinent test results  History of Anesthesia Complications Negative for: history of anesthetic complications  Airway Mallampati: II   Neck ROM: Full    Dental  (+) Upper Dentures, Missing   Pulmonary COPD, former smoker (quit 10/2022)   Pulmonary exam normal breath sounds clear to auscultation       Cardiovascular hypertension, Normal cardiovascular exam Rhythm:Regular Rate:Normal  ECG 05/17/23: T wave abn  Otherwise NSR  Echo 05/26/23:  1. Left ventricular ejection fraction, by estimation, is 55 to 60%. The left ventricle has normal function. The left ventricle has no regional wall motion abnormalities. Left ventricular diastolic parameters are consistent with Grade I diastolic dysfunction (impaired relaxation). The average left ventricular global longitudinal strain is -20.7 %. The global longitudinal strain is normal.  2. Right ventricular systolic function is normal. The right ventricular size is normal.  3. The mitral valve is normal in structure. Trivial mitral valve regurgitation. No evidence of mitral stenosis.  4. The aortic valve is tricuspid. There is mild calcification of the aortic valve. Aortic valve regurgitation is trivial. Aortic valve sclerosis/calcification is present, without any evidence of aortic stenosis.  5. The inferior vena cava is normal in size with greater than 50% respiratory variability, suggesting right atrial pressure of 3 mmHg.   Neuro/Psych  PSYCHIATRIC DISORDERS (PTSD) Anxiety Depression    negative neurological ROS     GI/Hepatic negative GI ROS,,,  Endo/Other  Obesity   Renal/GU negative Renal ROS     Musculoskeletal   Abdominal   Peds  Hematology negative hematology ROS (+)   Anesthesia Other Findings Reviewed and agree  with Edd Fabian pre-anesthesia clinical review note.    Cardiology phone note 06/30/23:  Chart reviewed as part of pre-operative protocol coverage.  Per Dr. Elease Hashimoto, "recent echo shows normal LV systolic function with grade I DD, Trivial valvular disease. She is at low risk for her upcoming GYN surgery. PN"    Additionally, recent coronary CTA showed nonobstructive CAD.    Therefore, given past medical history and time since last visit, based on ACC/AHA guidelines, Vittoria Noreen is at acceptable risk for the planned procedure without further cardiovascular testing.    Cardiology note 05/25/23:  1.  Abnormal EKG: She has an incomplete right bundle branch block with T wave inversions in the anterolateral leads.  Will get an echocardiogram to assess her LV function.  She is not having any episodes of angina.  She does have some shortness of breath but this could be due to deconditioning and her recent 40 pound weight gain.   She needs preoperative clearance for ovarian surgery.  If her echocardiogram is unremarkable we should be able to clear her fairly easily for her ovarian surgery.   2.  History of hyperlipidemia: She also has a strong family history of premature coronary artery disease.  Will get a coronary calcium score for further evaluation.  She is currently on atorvastatin.  Her last LDL is 84.    Will check LP(a) in 2 weeks    3.  Hypertension: She is currently on hydrochlorothiazide and irbesartan.  Blood pressure is well-controlled.  She is hypokalemic.  Will add potassium chloride and recheck levels in several weeks.   Reproductive/Obstetrics  Anesthesia Physical Anesthesia Plan  ASA: 2  Anesthesia Plan: General   Post-op Pain Management:    Induction: Intravenous  PONV Risk Score and Plan: 3 and Ondansetron, Dexamethasone and Treatment may vary due to age or medical condition  Airway Management Planned: Oral  ETT  Additional Equipment:   Intra-op Plan:   Post-operative Plan: Extubation in OR  Informed Consent: I have reviewed the patients History and Physical, chart, labs and discussed the procedure including the risks, benefits and alternatives for the proposed anesthesia with the patient or authorized representative who has indicated his/her understanding and acceptance.     Dental advisory given  Plan Discussed with: CRNA  Anesthesia Plan Comments: (Patient consented for risks of anesthesia including but not limited to:  - adverse reactions to medications - damage to eyes, teeth, lips or other oral mucosa - nerve damage due to positioning  - sore throat or hoarseness - damage to heart, brain, nerves, lungs, other parts of body or loss of life  Informed patient about role of CRNA in peri- and intra-operative care.  Patient voiced understanding.)        Anesthesia Quick Evaluation

## 2023-07-08 NOTE — Op Note (Signed)
 [facility] Sailors PROCEDURE DATE: 07/08/2023  PREOPERATIVE DIAGNOSIS: Pelvic mass POSTOPERATIVE DIAGNOSIS: Left ovarian cyst, approx 5cm above the umbilicus PROCEDURE:  XI ROBOTIC ASSISTED BILATERAL SALPINGO OOPHORECTOMY, REMOVAL OF ADNEXAL MASS: 16109 (CPT)  SURGEON:  Dr. Christeen Douglas ASSISTANT: Thomasene Mohair, MD  Anesthesiologist:  Anesthesiologist: Reed Breech, MD CRNA: Jaye Beagle, CRNA; Cheral Bay, CRNA  INDICATIONS: 53 y.o.  here for definitive surgical management secondary to the indications listed under preoperative diagnoses; please see preoperative note for further details.  Risks of surgery were discussed with the patient including but not limited to: bleeding which may require transfusion or reoperation; infection which may require antibiotics; injury to bowel, bladder, ureters or other surrounding organs; need for additional procedures; thromboembolic phenomenon, incisional problems and other postoperative/anesthesia complications. Written informed consent was obtained.    FINDINGS: Normal external genitalia, cervix and uterus.  Externally, the superior edge of the ovarian cyst was palpable 5 cm above the umbilicus.  Internally, the cyst was smooth and round, with mucinous fluid.  It arose from the left ovary.  The right ovary appeared normal.  Bilateral fallopian tubes were disrupted from a prior tubal ligation, but also appeared normal.  There was scarring between the left adnexa and the left sigmoid colon.   ANESTHESIA:    General INTRAVENOUS FLUIDS:1500  ml ESTIMATED BLOOD LOSS:25 ml URINE OUTPUT: 250 ml CYSTIC FLUID: 2470 mL  SPECIMENS: Bilateral ovaries and fallopian tubes, left adnexal mass COMPLICATIONS: None immediate  PROCEDURE IN DETAIL:  The patient received sequential compression devices applied to her lower extremities while in the preoperative area.  She was then taken to the operating room where general anesthesia was administered  and was found to be adequate.  She was placed in the dorsal lithotomy position, and was prepped and draped in a sterile manner.  A formal time out was performed with all team members present and in agreement.  An ZUMI uterine manipulator was placed at this time.  A Foley catheter was inserted into her bladder and attached to constant drainage.   After infiltration of local anesthetic at the proposed trocar sites, an 8 mm incision was created at Palmer's point, and an 8 mm robotic port was placed under direct visualization. Pneumoperitoneum was created to a pressure of 15 mm Hg. The camera was placed and the abdomen surveyed, noting intact bowel below the site of entry. A survey of the pelvis and upper abdomen revealed the above findings. One right and one left lateral 8-mm robotic ports were placed under direct visualization.  A 3 cm infraumbilical incision was made with direct cut down to just above the cyst under direct visualization.  A Tegaderm was placed on the cyst from the skin, with the incision being held open by an Teacher, early years/pre.  Sharp aspiration of the cyst allowed it to retract to the pelvic brim.  A right sided robotic port was placed, and the GelPort was placed at the umbilicus.  The patient was placed in deepTrendelenburg and the bowel was displaced up into the upper abdomen. The robot was left side docked. The instruments were placed under direct visualization.   The ureters were identified bilaterally coursing outside of the operative field.  The left sided ureter was found retroperitoneally, as the adhesions and the cyst prevented visualization of the left pelvic sidewall.  Lysis of adhesions along the left pelvic sidewall between the sigmoid colon and the peritoneum were taken down.  Attention was turned to the IP ligaments. These were fulgurated and  ligated, freeing the ovaries and fallopian tubes from the pelvic sidewall.  A large Endo Catch bag was placed through the GelPort and  the specimens were scooped into this bag.  Bag was brought to the umbilicus, and removed using the excite technique.  Mucinous fluid was noted.  Overall excellent hemostasis was noted.   Attention was returned to the abdomen.The ureters were reexamined bilaterally and were pulsating normally. The abdominal pressure was reduced and hemostasis was confirmed.    3 g of Arista were placed over the retroperitoneal dissection.  All instruments and the Foley were removed from the pelvis, and digital and direct visualization of the cervix revealed hemostasis.   All trocars were removed under direct visualization, and the abdomen was desufflated.  The fascial incision of the umbilicus was closed with a 0 Vicryl running stitch for full fascial closure.  All skin incisions were closed with 4-0 Vicryl subcuticular stitches and Dermabond. The patient tolerated the procedures well.  All instruments, needles, and sponge counts were correct x 2. The patient was taken to the recovery room awake, extubated and in stable condition.

## 2023-07-08 NOTE — Interval H&P Note (Signed)
 History and Physical Interval Note:  07/08/2023 7:26 AM  Sandra Montes  has presented today for surgery, with the diagnosis of large adnexal mass.  The various methods of treatment have been discussed with the patient and family. After consideration of risks, benefits and other options for treatment, the patient has consented to  Procedure(s): XI ROBOTIC ASSISTED BILATERAL SALPINGO OOPHORECTOMY, REMOVAL OF ADNEXAL MASS, ABDOMINAL MASS (Bilateral) as a surgical intervention.  The patient's history has been reviewed, patient examined, no change in status, stable for surgery.  I have reviewed the patient's chart and labs.  Questions were answered to the patient's satisfaction.     Christeen Douglas

## 2023-07-08 NOTE — Transfer of Care (Signed)
 Immediate Anesthesia Transfer of Care Note  Patient: Sandra Montes  Procedure(s) Performed: XI ROBOTIC ASSISTED BILATERAL SALPINGO OOPHORECTOMY, REMOVAL OF ADNEXAL MASS (Bilateral: Abdomen)  Patient Location: PACU  Anesthesia Type:General  Level of Consciousness: awake  Airway & Oxygen Therapy: Patient Spontanous Breathing and Patient connected to nasal cannula oxygen  Post-op Assessment: Report given to RN and Post -op Vital signs reviewed and stable  Post vital signs: Reviewed and stable  Last Vitals:  Vitals Value Taken Time  BP 158/84 07/08/23 1049  Temp    Pulse 72 07/08/23 1057  Resp 15 07/08/23 1057  SpO2 88 % 07/08/23 1057  Vitals shown include unfiled device data.  Last Pain:  Vitals:   07/08/23 0658  TempSrc: Oral  PainSc: 0-No pain         Complications: No notable events documented.

## 2023-07-08 NOTE — Discharge Instructions (Signed)
 Laparoscopic Ovarian Surgery Discharge Instructions  For the next three days, take ibuprofen and acetaminophen on a schedule, every 8 hours. You can take them together or you can intersperse them, and take one every four hours. I also gave you gabapentin for nighttime, to help you sleep and also to control pain. Take gabapentin medicines at night for the next 3 nights. You also have a narcotic, oxycodone, to take as needed if the above medicines don't help.  Postop constipation is a major cause of pain. Stay well hydrated, walk as you tolerate, and take over the counter senna as well as stool softeners if you need them.   Please do not lift anything heavier than 5 pounds for the next week.  Rest is much as possible, to allow your pelvis to readjust and for your healing to improve.  Too much activity, such as household chores, can delay healing and if you do feel that you overdo it, expect to be more tired the next day.   RISKS AND COMPLICATIONS  Infection. Bleeding. Injury to surrounding organs. Anesthetic side effects.   PROCEDURE  You may be given a medicine to help you relax (sedative) before the procedure. You will be given a medicine to make you sleep (general anesthetic) during the procedure. A tube will be put down your throat to help your breath while under general anesthesia. Several small cuts (incisions) are made in the lower abdominal area and one incision is made near the belly button. Your abdominal area will be inflated with a safe gas (carbon dioxide). This helps give the surgeon room to operate, visualize, and helps the surgeon avoid other organs. A thin, lighted tube (laparoscope) with a camera attached is inserted into your abdomen through the incision near the belly button. Other small instruments may also be inserted through other abdominal incisions. The ovary is located and are removed. After the ovary is removed, the gas is released from the abdomen. The incisions will  be closed with stitches (sutures), and Dermabond. A bandage may be placed over the incisions.  AFTER THE PROCEDURE  You will also have some mild abdominal discomfort for 3-7 days. You will be given pain medicine to ease any discomfort. As long as there are no problems, you may be allowed to go home. Someone will need to drive you home and be with you for at least 24 hours once home. You may have some mild discomfort in the throat. This is from the tube placed in your throat while you were sleeping. You may experience discomfort in the shoulder area from some trapped air between the liver and diaphragm. This sensation is normal and will slowly go away on its own.  HOME CARE INSTRUCTIONS  Take all medicines as directed. Only take over-the-counter or prescription medicines for pain, discomfort, or fever as directed by your caregiver. Resume daily activities as directed. Showers are preferred over baths for 2 weeks. You may resume sexual activities in 1 week or as you feel you would like to. Do not drive while taking narcotics.  SEEK MEDICAL CARE IF: . There is increasing abdominal pain. You feel lightheaded or faint. You have the chills. You have an oral temperature above 102 F (38.9 C). There is pus-like (purulent) drainage from any of the wounds. You are unable to pass gas or have a bowel movement. You feel sick to your stomach (nauseous) or throw up (vomit) and can't control it with your medicines.  MAKE SURE YOU:  Understand these instructions.  Will watch your condition. Will get help right away if you are not doing well or get worse.  Exit[employer] Patient Information 2013 [place], [place].

## 2023-07-09 ENCOUNTER — Encounter: Payer: Self-pay | Admitting: Obstetrics and Gynecology

## 2023-07-09 LAB — SURGICAL PATHOLOGY

## 2023-07-09 NOTE — Anesthesia Postprocedure Evaluation (Signed)
 Anesthesia Post Note  Patient: Sandra Montes  Procedure(s) Performed: XI ROBOTIC ASSISTED BILATERAL SALPINGO OOPHORECTOMY, REMOVAL OF ADNEXAL MASS (Bilateral: Abdomen)  Patient location during evaluation: PACU Anesthesia Type: General Level of consciousness: awake and alert, oriented and patient cooperative Pain management: pain level controlled Vital Signs Assessment: post-procedure vital signs reviewed and stable Respiratory status: spontaneous breathing, nonlabored ventilation and respiratory function stable Cardiovascular status: blood pressure returned to baseline and stable Postop Assessment: adequate PO intake Anesthetic complications: no   No notable events documented.   Last Vitals:  Vitals:   07/08/23 1145 07/08/23 1200  BP: 110/63 123/69  Pulse: 76 79  Resp: 13 16  Temp: (!) 36.1 C 36.6 C  SpO2: 99% 98%    Last Pain:  Vitals:   07/08/23 1200  TempSrc: Temporal  PainSc: 4                  Reed Breech

## 2023-07-21 ENCOUNTER — Ambulatory Visit: Payer: 59 | Admitting: Cardiology

## 2023-08-18 ENCOUNTER — Encounter: Payer: Self-pay | Admitting: Family

## 2023-08-18 ENCOUNTER — Ambulatory Visit (INDEPENDENT_AMBULATORY_CARE_PROVIDER_SITE_OTHER): Admitting: Family

## 2023-08-18 ENCOUNTER — Ambulatory Visit (INDEPENDENT_AMBULATORY_CARE_PROVIDER_SITE_OTHER)
Admission: RE | Admit: 2023-08-18 | Discharge: 2023-08-18 | Disposition: A | Discharge status: Home / Self Care | Source: Ambulatory Visit | Attending: Family | Admitting: Family

## 2023-08-18 ENCOUNTER — Telehealth: Payer: Self-pay | Admitting: Family

## 2023-08-18 VITALS — BP 128/78 | HR 61 | Temp 97.9°F | Ht 67.0 in | Wt 241.6 lb

## 2023-08-18 DIAGNOSIS — E785 Hyperlipidemia, unspecified: Secondary | ICD-10-CM

## 2023-08-18 DIAGNOSIS — Z01818 Encounter for other preprocedural examination: Secondary | ICD-10-CM | POA: Insufficient documentation

## 2023-08-18 DIAGNOSIS — Z87891 Personal history of nicotine dependence: Secondary | ICD-10-CM | POA: Diagnosis not present

## 2023-08-18 DIAGNOSIS — R808 Other proteinuria: Secondary | ICD-10-CM

## 2023-08-18 DIAGNOSIS — R7303 Prediabetes: Secondary | ICD-10-CM | POA: Insufficient documentation

## 2023-08-18 DIAGNOSIS — I10 Essential (primary) hypertension: Secondary | ICD-10-CM

## 2023-08-18 DIAGNOSIS — J432 Centrilobular emphysema: Secondary | ICD-10-CM

## 2023-08-18 DIAGNOSIS — L301 Dyshidrosis [pompholyx]: Secondary | ICD-10-CM

## 2023-08-18 DIAGNOSIS — R635 Abnormal weight gain: Secondary | ICD-10-CM | POA: Diagnosis not present

## 2023-08-18 LAB — URINALYSIS, ROUTINE W REFLEX MICROSCOPIC
Bilirubin Urine: NEGATIVE
Hgb urine dipstick: NEGATIVE
Ketones, ur: NEGATIVE
Leukocytes,Ua: NEGATIVE
Nitrite: NEGATIVE
Specific Gravity, Urine: 1.015 (ref 1.000–1.030)
Urine Glucose: NEGATIVE
Urobilinogen, UA: 0.2 (ref 0.0–1.0)
pH: 7.5 (ref 5.0–8.0)

## 2023-08-18 LAB — COMPREHENSIVE METABOLIC PANEL WITH GFR
ALT: 17 U/L (ref 0–35)
AST: 19 U/L (ref 0–37)
Albumin: 4.3 g/dL (ref 3.5–5.2)
Alkaline Phosphatase: 50 U/L (ref 39–117)
BUN: 14 mg/dL (ref 6–23)
CO2: 34 meq/L — ABNORMAL HIGH (ref 19–32)
Calcium: 9.2 mg/dL (ref 8.4–10.5)
Chloride: 98 meq/L (ref 96–112)
Creatinine, Ser: 0.71 mg/dL (ref 0.40–1.20)
GFR: 97.14 mL/min (ref >60.00)
Glucose, Bld: 113 mg/dL — ABNORMAL HIGH (ref 70–99)
Potassium: 4.2 meq/L (ref 3.5–5.1)
Sodium: 140 meq/L (ref 135–145)
Total Bilirubin: 0.5 mg/dL (ref 0.2–1.2)
Total Protein: 6.7 g/dL (ref 6.0–8.3)

## 2023-08-18 LAB — PROTIME-INR
INR: 0.9 ratio (ref 0.8–1.0)
Prothrombin Time: 9.8 s (ref 9.6–13.1)

## 2023-08-18 LAB — CBC
HCT: 41 % (ref 36.0–46.0)
Hemoglobin: 14.3 g/dL (ref 12.0–15.0)
MCHC: 35 g/dL (ref 30.0–36.0)
MCV: 88.9 fl (ref 78.0–100.0)
Platelets: 175 10*3/uL (ref 150.0–400.0)
RBC: 4.61 Mil/uL (ref 3.87–5.11)
RDW: 12.9 % (ref 11.5–15.5)
WBC: 7.3 10*3/uL (ref 4.0–10.5)

## 2023-08-18 LAB — T4, FREE: Free T4: 0.69 ng/dL (ref 0.60–1.60)

## 2023-08-18 LAB — MICROALBUMIN / CREATININE URINE RATIO
Creatinine,U: 100.9 mg/dL
Microalb Creat Ratio: 124.2 mg/g — ABNORMAL HIGH (ref 0.0–30.0)
Microalb, Ur: 12.5 mg/dL — ABNORMAL HIGH (ref 0.0–1.9)

## 2023-08-18 LAB — TSH: TSH: 4.19 u[IU]/mL (ref 0.35–5.50)

## 2023-08-18 LAB — HEMOGLOBIN A1C: Hgb A1c MFr Bld: 6.1 % (ref 4.6–6.5)

## 2023-08-18 MED ORDER — TRIAMCINOLONE ACETONIDE 0.1 % EX CREA: 1.0000 | TOPICAL_CREAM | Freq: Two times a day (BID) | CUTANEOUS | 0 refills | Status: DC

## 2023-08-18 NOTE — Telephone Encounter (Signed)
 I am seeing this pt for a right knee arthroscopy, EKG today stable.  She had a recent clearance with you for adnexal mass removal 07/2023.  Do you need to see her again or ok with clearance without visit?

## 2023-08-18 NOTE — Assessment & Plan Note (Signed)
 Stable

## 2023-08-18 NOTE — Progress Notes (Addendum)
 Assessment & Plan:  Essential hypertension -     Microalbumin / creatinine urine ratio  Dyslipidemia Assessment & Plan: Stable.     Pre-op evaluation  History of tobacco abuse  Centrilobular emphysema (HCC)  Other proteinuria  Prediabetes -     Hemoglobin A1c  Abnormal weight gain Assessment & Plan: Tsh ordered pending results.   Orders: -     TSH -     T4, free  Dyshidrotic eczema -     Triamcinolone  Acetonide; Apply 1 Application topically 2 (two) times daily.  Dispense: 30 g; Refill: 0  Encounter for preoperative examination for general surgical procedure Assessment & Plan: Cxr and EKG ordered EKG NSR with stability from prior 05/2023.  Cardiology, reached out to see if cardiology would like to repeat clearance as just had clearance for recent surgery within last six months, pending response.  Lab work ordered today for u/a pt inr cbc cmp and A1c pending Echo reviewed.   Addendum 08/23/23 low risk per cardiology, received message that they will be faxing over clearance to surgeon  Reviewed labs, CXR and EKG. Pt cleared from a primary care perspective as well for surgery.   Orders: -     Urinalysis, Routine w reflex microscopic -     Protime-INR -     Comprehensive metabolic panel with GFR -     CBC -     DG Chest 2 View; Future -     EKG 12-Lead    I have independently evaluated patient.  Sandra Montes is a 53 y.o. female who is above average risk for a intermediate risk surgery.  There are not modifiable risk factors (smoking, etc). Sandra Montes's RCRI/NSQIP calculation for MACE is: 3, normal.    Review meds: If indicated, NO ACE-I/ARB day of surgery. OK to do BB or statin day of (esp if vascular sx) per surgeon discretion  Essential hypertension -     Microalbumin / creatinine urine ratio  Dyslipidemia Assessment & Plan: Stable.     Pre-op evaluation  History of tobacco abuse  Centrilobular emphysema (HCC)  Other  proteinuria  Prediabetes -     Hemoglobin A1c  Abnormal weight gain Assessment & Plan: Tsh ordered pending results.   Orders: -     TSH -     T4, free  Dyshidrotic eczema -     Triamcinolone  Acetonide; Apply 1 Application topically 2 (two) times daily.  Dispense: 30 g; Refill: 0  Encounter for preoperative examination for general surgical procedure Assessment & Plan: Cxr and EKG ordered EKG NSR with stability from prior 05/2023.  Cardiology, reached out to see if cardiology would like to repeat clearance as just had clearance for recent surgery within last six months, pending response.  Lab work ordered today for u/a pt inr cbc cmp and A1c pending Echo reviewed.   Addendum 08/23/23 low risk per cardiology, received message that they will be faxing over clearance to surgeon  Reviewed labs, CXR and EKG. Pt cleared from a primary care perspective as well for surgery.   Orders: -     Urinalysis, Routine w reflex microscopic -     Protime-INR -     Comprehensive metabolic panel with GFR -     CBC -     DG Chest 2 View; Future -     EKG 12-Lead    Return if symptoms worsen or fail to improve.  Sandra Horns, MSN, APRN, FNP-C [facility]  Subjective:  HPI: Pt is a 53 y.o. female who is here for preoperative clearance for right knee arthroscopy with partial meniscectomy. She has   1) High Risk Cardiac Conditions:  1) Recent MI - No.  2) Decompensated Heart Failure - No.  3) Unstable angina - No.  4) Symptomatic arrythmia - No.  5) Sx Valvular Disease - No.  2) Intermediate Risk Factors: DM, CKD, CVA, CHF, CAD - No.  2) Functional Status: > 4 mets (Walk, run, climb stairs) Yes.  Sandra Montes   3) Surgery Specific Risk: High (Emergency, Vascular, Intra-abdominal, Extensive ops)          Intermediate (Carotid, Head and Neck, Orthopaedic )          Low (Endoscopic, Cataract, Breast )  4) Further Noninvasive evaluation:   1) EKG - Yes.     Hx of  MI, CVA, CAD, DM, CKD  2) Echo - No.   Worsening dyspnea or CHF without an echo in the past year  3) Stress Testing - Active Cardiac Disease - No.  4) CXR Yes.     If asymptomatic, healthy, no respiratory symptoms it is not needed  5)PFTs No.   OSA, OHS, or significant cardiopulmonary history  5) Need for medical therapy - Beta Blocker, Statins indicated ? No.  Large adnexal mass, 07/2022 XL robotic assisted bil salpingo oophorectomy, removal of adnexal mass. Completely healed.   Incomplete RBBB T wave inversions, echo  Was cleared for surgery after echo grade 1 diastolic dysfunction, trivial MR, mild calcification aortic valve. EF 55-60% 05/25/23 for removal of adnexal mass On atorvastatin  and zetia  Managed on hydrochlorothiazide  and irbesartan   CTA 06/10/23 no acute findings    Social history:  Relevant past medical, surgical, family and social history reviewed and updated as indicated. Interim medical history since our last visit reviewed.  Allergies and medications reviewed and updated.  DATA REVIEWED: CHART IN EPIC  ROS: Negative unless specifically indicated above in HPI.    Current Outpatient Medications:    acetaminophen  (TYLENOL ) 650 MG CR tablet, Take 650 mg by mouth every 8 (eight) hours as needed for pain., Disp: , Rfl:    albuterol  (VENTOLIN  HFA) 108 (90 Base) MCG/ACT inhaler, INHALE 2 PUFFS INTO THE LUNGS EVERY 4 HOURS AS NEEDED FOR WHEEZING OR SHORTNESS OF BREATH, Disp: 8.5 g, Rfl: 0   atorvastatin  (LIPITOR) 40 MG tablet, Take 1 tablet (40 mg total) by mouth daily. (Patient taking differently: Take 40 mg by mouth every evening.), Disp: 90 tablet, Rfl: 3   B Complex-C (B-COMPLEX WITH VITAMIN C) tablet, Take 1 tablet by mouth in the morning., Disp: , Rfl:    Calcium  Citrate-Vitamin D (CITRACAL PETITES/VITAMIN D PO), Take 1 tablet by mouth in the morning and at bedtime., Disp: , Rfl:    divalproex  (DEPAKOTE  ER) 250 MG 24 hr tablet, TAKE 1 TABLET BY MOUTH EVERY DAY WITH  SUPPER ALONG WITH 500MG  TABLET TOTAL 750MG  DAILY, Disp: 90 tablet, Rfl: 0   divalproex  (DEPAKOTE  ER) 500 MG 24 hr tablet, TAKE 1 TABLET BY MOUTH EVERY NIGHT AT BEDTIME, Disp: 90 tablet, Rfl: 0   DULoxetine  (CYMBALTA ) 20 MG capsule, Take 1 capsule (20 mg total) by mouth 2 (two) times daily., Disp: 180 capsule, Rfl: 1   ezetimibe  (ZETIA ) 10 MG tablet, Take 1 tablet (10 mg total) by mouth daily. (Patient taking differently: Take 10 mg by mouth every evening.), Disp: 90 tablet, Rfl: 3   hydrochlorothiazide  (HYDRODIURIL ) 25 MG tablet, Take 1 tablet (25 mg total)  by mouth daily., Disp: 90 tablet, Rfl: 3   hydrOXYzine  (ATARAX ) 25 MG tablet, Take 1 tablet (25 mg total) by mouth 3 (three) times daily as needed., Disp: 90 tablet, Rfl: 0   ibuprofen  (ADVIL ) 200 MG tablet, Take 600 mg by mouth every 8 (eight) hours as needed (pain.)., Disp: , Rfl:    irbesartan  (AVAPRO ) 300 MG tablet, Take 1 tablet (300 mg total) by mouth daily., Disp: 90 tablet, Rfl: 3   metoprolol  succinate (TOPROL -XL) 50 MG 24 hr tablet, Take 1 tablet (50 mg total) by mouth daily. Take with or immediately following a meal., Disp: 90 tablet, Rfl: 3   OMEGA-3 FATTY ACIDS PO, Take 2,400 mg by mouth in the morning., Disp: , Rfl:    oxyCODONE  (OXY IR/ROXICODONE ) 5 MG immediate release tablet, Take 1 tablet (5 mg total) by mouth every 4 (four) hours as needed for severe pain (pain score 7-10)., Disp: 10 tablet, Rfl: 0   potassium chloride  (KLOR-CON ) 10 MEQ tablet, Take 1 tablet (10 mEq total) by mouth 2 (two) times daily., Disp: 180 tablet, Rfl: 3   traZODone  (DESYREL ) 50 MG tablet, Take 1 tablet (50 mg total) by mouth at bedtime as needed for sleep., Disp: 90 tablet, Rfl: 3   triamcinolone  cream (KENALOG ) 0.1 %, Apply 1 Application topically 2 (two) times daily., Disp: 30 g, Rfl: 0   amLODipine  (NORVASC ) 10 MG tablet, Take 10 mg by mouth daily. (Patient not taking: Reported on 08/18/2023), Disp: , Rfl:    cyclobenzaprine  (FLEXERIL ) 10 MG tablet,  Take 1 tablet (10 mg total) by mouth 2 (two) times daily as needed. (Patient not taking: Reported on 08/18/2023), Disp: 20 tablet, Rfl: 0   docusate sodium  (COLACE) 100 MG capsule, Take 1 capsule (100 mg total) by mouth 2 (two) times daily. To keep stools soft (Patient not taking: Reported on 08/18/2023), Disp: 30 capsule, Rfl: 0   gabapentin  (NEURONTIN ) 300 MG capsule, Take 1 capsule (300 mg total) by mouth at bedtime for 14 days. Take nightly for three days after surgery, and then as needed for pain for 14 days., Disp: 14 capsule, Rfl: 0      Objective:    BP 128/78   Pulse 61   Temp 97.9 F (36.6 C) (Oral)   Ht 5\' 7"  (1.702 m)   Wt 241 lb 9.6 oz (109.6 kg)   LMP 10/04/2022 (Approximate) Comment: has period off/on  SpO2 98%   BMI 37.84 kg/m   Wt Readings from Last 3 Encounters:  08/18/23 241 lb 9.6 oz (109.6 kg)  07/08/23 241 lb (109.3 kg)  07/02/23 241 lb (109.3 kg)    Physical Exam Vitals reviewed.  Constitutional:      General: She is not in acute distress.    Appearance: She is obese. She is not ill-appearing.  HENT:     Head: Normocephalic.     Right Ear: Tympanic membrane normal.     Left Ear: Tympanic membrane normal.     Nose: Nose normal.     Mouth/Throat:     Mouth: Mucous membranes are moist.     Dentition: Has dentures (upper).     Pharynx: Postnasal drip present.  Eyes:     Extraocular Movements: Extraocular movements intact.     Pupils: Pupils are equal, round, and reactive to light.  Cardiovascular:     Rate and Rhythm: Normal rate and regular rhythm.  Pulmonary:     Effort: Pulmonary effort is normal.     Breath sounds: Normal  breath sounds.  Abdominal:     General: Abdomen is flat. Bowel sounds are normal.     Palpations: Abdomen is soft.     Tenderness: There is no guarding or rebound.    Musculoskeletal:        General: Normal range of motion.     Cervical back: Normal range of motion.  Skin:    General: Skin is warm.     Capillary Refill:  Capillary refill takes less than 2 seconds.  Neurological:     General: No focal deficit present.     Mental Status: She is alert.  Psychiatric:        Mood and Affect: Mood normal.        Behavior: Behavior normal.        Thought Content: Thought content normal.        Judgment: Judgment normal.    Lab Results  Component Value Date   HGBA1C 6.1 08/18/2023   Lab Results  Component Value Date   CHOL 143 10/20/2022   HDL 47.40 10/20/2022   LDLCALC 84 10/20/2022   TRIG 61.0 10/20/2022   CHOLHDL 3 10/20/2022

## 2023-08-18 NOTE — Assessment & Plan Note (Addendum)
 Cxr and EKG ordered EKG NSR with stability from prior 05/2023.  Cardiology, reached out to see if cardiology would like to repeat clearance as just had clearance for recent surgery within last six months, pending response.  Lab work ordered today for u/a pt inr cbc cmp and A1c pending Echo reviewed.   Addendum 08/23/23 low risk per cardiology, received message that they will be faxing over clearance to surgeon  Reviewed labs, CXR and EKG. Pt cleared from a primary care perspective as well for surgery.

## 2023-08-18 NOTE — Assessment & Plan Note (Signed)
Tsh ordered pending results.

## 2023-08-19 ENCOUNTER — Encounter: Payer: Self-pay | Admitting: Family

## 2023-08-19 ENCOUNTER — Other Ambulatory Visit: Payer: Self-pay | Admitting: Family

## 2023-08-19 DIAGNOSIS — R82998 Other abnormal findings in urine: Secondary | ICD-10-CM

## 2023-08-19 DIAGNOSIS — R809 Proteinuria, unspecified: Secondary | ICD-10-CM

## 2023-08-20 NOTE — Telephone Encounter (Signed)
   Patient Name: Sandra Montes  DOB: [birth year] MRN: [id number]  Primary Cardiologist: None  Chart reviewed as part of pre-operative protocol coverage. Given past medical history and time since last visit, based on ACC/AHA guidelines, Sandra Montes is at acceptable risk for the planned procedure without further cardiovascular testing.   The patient was advised that if she develops new symptoms prior to surgery to contact our office to arrange for a follow-up visit, and she verbalized understanding.  I will route this recommendation to the requesting party via Epic fax function and remove from pre-op pool.  Please call with questions.  Francene Ing, Retha Cast, NP 08/20/2023, 7:46 AM

## 2023-08-23 NOTE — Telephone Encounter (Signed)
 Pt cleared for surgery.  Please fax over my office note with paperwork in my outbox

## 2023-08-23 NOTE — Telephone Encounter (Signed)
 Surgical clearance form and OV note have been faxed.

## 2023-08-31 ENCOUNTER — Ambulatory Visit
Admission: RE | Admit: 2023-08-31 | Discharge: 2023-08-31 | Disposition: A | Discharge status: Home / Self Care | Payer: Self-pay | Source: Ambulatory Visit | Attending: Family | Admitting: Family

## 2023-08-31 DIAGNOSIS — R809 Proteinuria, unspecified: Secondary | ICD-10-CM | POA: Diagnosis present

## 2023-09-02 ENCOUNTER — Encounter: Payer: Self-pay | Admitting: Family

## 2023-09-02 ENCOUNTER — Telehealth (INDEPENDENT_AMBULATORY_CARE_PROVIDER_SITE_OTHER): Payer: 59 | Admitting: Psychiatry

## 2023-09-02 ENCOUNTER — Encounter: Payer: Self-pay | Admitting: Psychiatry

## 2023-09-02 DIAGNOSIS — F1011 Alcohol abuse, in remission: Secondary | ICD-10-CM | POA: Diagnosis not present

## 2023-09-02 DIAGNOSIS — F1211 Cannabis abuse, in remission: Secondary | ICD-10-CM | POA: Diagnosis not present

## 2023-09-02 DIAGNOSIS — F4310 Post-traumatic stress disorder, unspecified: Secondary | ICD-10-CM

## 2023-09-02 DIAGNOSIS — F3342 Major depressive disorder, recurrent, in full remission: Secondary | ICD-10-CM

## 2023-09-02 MED ORDER — DULOXETINE HCL 20 MG PO CPEP: 20.0000 mg | ORAL_CAPSULE | Freq: Two times a day (BID) | ORAL | 1 refills | Status: DC

## 2023-09-02 MED ORDER — DIVALPROEX SODIUM ER 500 MG PO TB24: 500.0000 mg | ORAL_TABLET | Freq: Every day | ORAL | 1 refills | Status: DC

## 2023-09-02 MED ORDER — DIVALPROEX SODIUM ER 250 MG PO TB24: 250.0000 mg | ORAL_TABLET | Freq: Every day | ORAL | 1 refills | Status: DC

## 2023-09-02 NOTE — Progress Notes (Signed)
 Virtual Visit via Video Note  I connected with Sandra Montes on 09/02/23 at  4:00 PM EDT by a video enabled telemedicine application and verified that I am speaking with the correct person using two identifiers.  Location Provider Location : [facility] Patient Location : Home  Participants: Patient , Provider   I discussed the limitations of evaluation and management by telemedicine and the availability of in person appointments. The patient expressed understanding and agreed to proceed   I discussed the assessment and treatment plan with the patient. The patient was provided an opportunity to ask questions and all were answered. The patient agreed with the plan and demonstrated an understanding of the instructions.   The patient was advised to call back or seek an in-person evaluation if the symptoms worsen or if the condition fails to improve as anticipated.   BH MD OP Progress Note  09/03/2023 3:35 PM Tynequa Harison  MRN:  [id number]  Chief Complaint:  Chief Complaint  Patient presents with   Follow-up   Depression   Anxiety   Medication Refill   Discussed the use of AI scribe software for clinical note transcription with the patient, who gave verbal consent to proceed.  History of Present Illness Sandra Montes is a 53 year old Caucasian female, divorced, currently employed, lives in [place], has a history of MDD, PTSD, history of alcohol and cannabis abuse in remission, emphysema, hypertension, asthma, osteoarthritis was evaluated by telemedicine today presents for medication management and weight gain concerns.  She has been on Depakote  since August of last year, which coincides with a significant weight gain of five to eight pounds per month. She acknowledges that her psychotropic medications, particularly Depakote , may contribute to this weight gain. She also experienced hair loss as a side effect, which has since improved. She values the mood stabilization  provided by Depakote , noting a reduction in racing thoughts and circular thinking, which she has experienced her whole life.  She underwent surgery on July 08, 2023, for the removal of a large benign cyst of her ovaries. 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 This injury has limited her ability to exercise, contributing to her weight gain. She is working with a nutritionist from [employer] to address her weight concerns.  She reports sleeping excessively, going to bed around 9:15 to 9:30 PM and struggling to get out of bed by 7:30 AM. She attributes part of this to her current work-from-home situation, which allows her to start work without commuting. She takes her medications, including Depakote , at 7 PM, which she believes contributes to her sleepiness.  She is also on trazodone  for sleep which she uses only as needed.  She is currently compliant on the Cymbalta , denies side effects.  She had labs completed recently including hemoglobin A1c, CBC, TSH, CMP which were within normal limits.  She denies any other concerns today.     Visit Diagnosis:    ICD-10-CM   1. Recurrent major depressive disorder, in full remission (HCC)  F33.42 DULoxetine  (CYMBALTA ) 20 MG capsule    divalproex  (DEPAKOTE  ER) 500 MG 24 hr tablet    divalproex  (DEPAKOTE  ER) 250 MG 24 hr tablet    2. PTSD (post-traumatic stress disorder)  F43.10     3. History of alcohol abuse  F10.11     4. History of cannabis abuse  F12.11  Past Psychiatric History: I have reviewed past psychiatric history from progress note on 03/24/2023.  Past trials of medications like Pristiq, Wellbutrin, Paxil, Prozac  Past Medical History:  Past Medical History:  Diagnosis Date   Adnexal mass 05/17/2022   a.) CT A/P 05/18/2023: 17.0 x 16.3 x 12.8 cm --> favored to be a large benign fluid filled ovarian cyst -->  surgical resection recommended/pending   Anxiety    Aortic atherosclerosis (HCC)    Bartholin's gland abscess    CAD (coronary artery disease) 06/04/2023   a.) cCTA 06/04/2023: Ca2+ = 450 (99th %'ile) --> 27.6 LAD, 66.1 LCx, 356 RCA   Chronic left shoulder pain    Depression    Diastolic dysfunction 05/26/2023   a.) TTE 05/26/2023: EF 55-60%, no RWMAs, G1DD, triv AR/MR, mild AoC sclerosis without stenosis   DOE (dyspnea on exertion)    Emphysema lung (HCC)    History of 2019 novel coronavirus disease (COVID-19) 12/13/2020   History of alcohol abuse    History of allergy     History of cannabis abuse    History of suicidal ideation 09/14/2022   a.) "scared to do it"; increasing intrusive thoughts about jumping off a cliff because she is "tired of fighting"; (+) crying, anxiety, depression, inturrupted sleep, appetite changes related to relationship problems (broke up with BF) and what sounds like caregiver role strain (caregiver to very demanding mother); (+) polysubstance use/abuse   HLD (hyperlipidemia)    Hyperlipidemia    Hypertension    Insomnia    a.) uses trazodone  PRN   Irritable bowel syndrome with diarrhea    Nephrolithiasis    Pain in both hands    Polyarthralgia    Positive ANA (antinuclear antibody)    PTSD (post-traumatic stress disorder)    RBBB (right bundle branch block)    Sinus bradycardia    Urinary incontinence    Weight gain due to medication    a.) significant (40lb) weight gain with the initiation of divalproex     Past Surgical History:  Procedure Laterality Date   CESAREAN SECTION N/A 1993   CHOLECYSTECTOMY N/A 1996   COLONOSCOPY WITH PROPOFOL  N/A 11/18/2021   Procedure: COLONOSCOPY WITH PROPOFOL ;  Surgeon: Selena Daily, MD;  Location: [facility];  Service: Gastroenterology;  Laterality: N/A;   ROBOTIC ASSISTED BILATERAL SALPINGO OOPHERECTOMY Bilateral 07/08/2023   Procedure: XI ROBOTIC ASSISTED BILATERAL SALPINGO OOPHORECTOMY, REMOVAL OF  ADNEXAL MASS;  Surgeon: Prescilla Brod, MD;  Location: [facility] OR[facility];  Service: Gynecology;  Laterality: Bilateral;   TUBAL LIGATION Bilateral 1995    Family Psychiatric History: I have reviewed family psychiatric history from progress note on 03/24/2023.  Family History:  Family History  Problem Relation Age of Onset   Depression Mother    Alcohol abuse Mother    Heart attack Mother 19   Skin cancer Father    Alcohol abuse Father    Prostate cancer Father    Rheum arthritis Paternal Grandmother    Prostate cancer Paternal Grandfather    Skin cancer Paternal Grandfather    Asthma Daughter    Mental illness Daughter        borderline   Mitral valve prolapse Daughter        & enlarged aorta   Migraines Daughter    Alcohol abuse Cousin    Healthy Son    Healthy Son     Social History: I have reviewed social history from progress note on 03/24/2023. Social History   Socioeconomic History   Marital status:  Divorced    Spouse name: Not on file   Number of children: 3   Years of education: 85   Highest education level: Associate degree: occupational, Scientist, product/process development, or vocational program  Occupational History    Employer: [employer]    Comment: mortgage insurance  Tobacco Use   Smoking status: Former    Current packs/day: 0.00    Average packs/day: 1.7 packs/day for 57.0 years (95.5 ttl pk-yrs)    Types: Cigarettes    Start date: 2004    Quit date: 2024    Years since quitting: 1.3    Passive exposure: Current   Smokeless tobacco: Never   Tobacco comments:    Pt smokes two packs cigarettes for the past 20 yrs, wants 14 mg patch  Vaping Use   Vaping status: Never Used  Substance and Sexual Activity   Alcohol use: Not Currently    Comment: h/o alcoholism quit 2017   Drug use: Yes    Types: Marijuana    Comment: occ   Sexual activity: Not Currently    Partners: Male    Birth control/protection: None  Other Topics Concern   Not on file  Social History Narrative    Active smoker- lives in [place]; with mother- care giver; with quality/control- mortgage.    Social Drivers of Health   Financial Resource Strain: Medium Risk (05/14/2023)   Overall Financial Resource Strain (CARDIA)    Difficulty of Paying Living Expenses: Somewhat hard  Food Insecurity: No Food Insecurity (05/14/2023)   Hunger Vital Sign    Worried About Running Out of Food in the Last Year: Never true    Ran Out of Food in the Last Year: Never true  Transportation Needs: No Transportation Needs (05/14/2023)   PRAPARE - Administrator, Civil Service (Medical): No    Lack of Transportation (Non-Medical): No  Physical Activity: Unknown (05/14/2023)   Exercise Vital Sign    Days of Exercise per Week: 0 days    Minutes of Exercise per Session: Not on file  Stress: No Stress Concern Present (05/14/2023)   [employer] of Occupational Health - Occupational Stress Questionnaire    Feeling of Stress : Only a little  Social Connections: Socially Isolated (05/14/2023)   Social Connection and Isolation Panel [NHANES]    Frequency of Communication with Friends and Family: More than three times a week    Frequency of Social Gatherings with Friends and Family: Once a week    Attends Religious Services: Never    Database administrator or Organizations: No    Attends Engineer, structural: Not on file    Marital Status: Divorced    Allergies:  Allergies  Allergen Reactions   Buspar [Buspirone] Other (See Comments)    Optical migraines   Lisinopril Cough   Morphine Itching and Other (See Comments)    "tried to take her face off due to itching"   Norvasc  [Amlodipine ] Swelling   Sulfa Antibiotics Other (See Comments)    Passes out    Metabolic Disorder Labs: Lab Results  Component Value Date   HGBA1C 6.1 08/18/2023   MPG 111 07/04/2021   Lab Results  Component Value Date   PROLACTIN 4.3 01/13/2022   Lab Results  Component Value Date   CHOL 143  10/20/2022   TRIG 61.0 10/20/2022   HDL 47.40 10/20/2022   CHOLHDL 3 10/20/2022   VLDL 12.2 10/20/2022   LDLCALC 84 10/20/2022   LDLCALC 69 01/13/2022   Lab  Results  Component Value Date   TSH 4.19 08/18/2023   TSH 2.239 12/16/2022    Therapeutic Level Labs: No results found for: "LITHIUM" Lab Results  Component Value Date   VALPROATE 54.0 05/17/2023   VALPROATE 64.6 01/01/2023   No results found for: "CBMZ"  Current Medications: Current Outpatient Medications  Medication Sig Dispense Refill   lidocaine -prilocaine (EMLA) cream Apply topically.     acetaminophen  (TYLENOL ) 650 MG CR tablet Take 650 mg by mouth every 8 (eight) hours as needed for pain.     albuterol  (VENTOLIN  HFA) 108 (90 Base) MCG/ACT inhaler INHALE 2 PUFFS INTO THE LUNGS EVERY 4 HOURS AS NEEDED FOR WHEEZING OR SHORTNESS OF BREATH 8.5 g 0   amLODipine  (NORVASC ) 10 MG tablet Take 10 mg by mouth daily. (Patient not taking: Reported on 08/18/2023)     atorvastatin  (LIPITOR) 40 MG tablet Take 1 tablet (40 mg total) by mouth daily. (Patient taking differently: Take 40 mg by mouth every evening.) 90 tablet 3   B Complex-C (B-COMPLEX WITH VITAMIN C) tablet Take 1 tablet by mouth in the morning.     Calcium  Citrate-Vitamin D (CITRACAL PETITES/VITAMIN D PO) Take 1 tablet by mouth in the morning and at bedtime.     cyclobenzaprine  (FLEXERIL ) 10 MG tablet Take 1 tablet (10 mg total) by mouth 2 (two) times daily as needed. (Patient not taking: Reported on 08/18/2023) 20 tablet 0   divalproex  (DEPAKOTE  ER) 250 MG 24 hr tablet Take 1 tablet (250 mg total) by mouth daily with supper. Take along with 500 mg daily 90 tablet 1   divalproex  (DEPAKOTE  ER) 500 MG 24 hr tablet Take 1 tablet (500 mg total) by mouth at bedtime. Take along with 250 mg daily 90 tablet 1   docusate sodium  (COLACE) 100 MG capsule Take 1 capsule (100 mg total) by mouth 2 (two) times daily. To keep stools soft (Patient not taking: Reported on 08/18/2023) 30 capsule  0   DULoxetine  (CYMBALTA ) 20 MG capsule Take 1 capsule (20 mg total) by mouth 2 (two) times daily. 180 capsule 1   ezetimibe  (ZETIA ) 10 MG tablet Take 1 tablet (10 mg total) by mouth daily. (Patient taking differently: Take 10 mg by mouth every evening.) 90 tablet 3   gabapentin  (NEURONTIN ) 300 MG capsule Take 1 capsule (300 mg total) by mouth at bedtime for 14 days. Take nightly for three days after surgery, and then as needed for pain for 14 days. 14 capsule 0   hydrochlorothiazide  (HYDRODIURIL ) 25 MG tablet Take 1 tablet (25 mg total) by mouth daily. 90 tablet 3   hydrOXYzine  (ATARAX ) 25 MG tablet Take 1 tablet (25 mg total) by mouth 3 (three) times daily as needed. 90 tablet 0   ibuprofen  (ADVIL ) 200 MG tablet Take 600 mg by mouth every 8 (eight) hours as needed (pain.).     irbesartan  (AVAPRO ) 300 MG tablet Take 1 tablet (300 mg total) by mouth daily. 90 tablet 3   metoprolol  succinate (TOPROL -XL) 50 MG 24 hr tablet Take 1 tablet (50 mg total) by mouth daily. Take with or immediately following a meal. 90 tablet 3   OMEGA-3 FATTY ACIDS PO Take 2,400 mg by mouth in the morning.     oxyCODONE  (OXY IR/ROXICODONE ) 5 MG immediate release tablet Take 1 tablet (5 mg total) by mouth every 4 (four) hours as needed for severe pain (pain score 7-10). 10 tablet 0   potassium chloride  (KLOR-CON ) 10 MEQ tablet Take 1 tablet (10 mEq  total) by mouth 2 (two) times daily. 180 tablet 3   traZODone  (DESYREL ) 50 MG tablet Take 1 tablet (50 mg total) by mouth at bedtime as needed for sleep. 90 tablet 3   triamcinolone  cream (KENALOG ) 0.1 % Apply 1 Application topically 2 (two) times daily. 30 g 0   No current facility-administered medications for this visit.     Musculoskeletal: Strength & Muscle Tone:  UTA Gait & Station:  Seated Patient leans: N/A  Psychiatric Specialty Exam: Review of Systems  Psychiatric/Behavioral: Negative.      Last menstrual period 10/04/2022.There is no height or weight on file to  calculate BMI.  General Appearance: Casual  Eye Contact:  Fair  Speech:  Normal Rate  Volume:  Normal  Mood:  Euthymic  Affect:  Congruent  Thought Process:  Goal Directed and Descriptions of Associations: Intact  Orientation:  Full (Time, Place, and Person)  Thought Content: Logical   Suicidal Thoughts:  No  Homicidal Thoughts:  No  Memory:  Immediate;   Fair Recent;   Fair Remote;   Fair  Judgement:  Fair  Insight:  Fair  Psychomotor Activity:  Normal  Concentration:  Concentration: Fair and Attention Span: Fair  Recall:  [employer] of Knowledge: Fair  Language: Fair  Akathisia:  No  Handed:  Right  AIMS (if indicated): not done  Assets:  Desire for Improvement Housing Social Support Vocational/Educational  ADL's:  Intact  Cognition: WNL  Sleep:  Fair   Screenings: AUDIT    Flowsheet Row [facility]  Alcohol Use Disorder Identification Test Final Score (AUDIT) 0      GAD-7    Flowsheet Row Office Visit from 03/24/2023 in [facility] Office Visit from 03/18/2023 in [facility] [place] Office Visit from 02/16/2023 in [facility] [place] Office Visit from 02/11/2023 in [facility] Office Visit from 01/28/2023 in [facility]  Total GAD-7 Score 3 5 3 11 15       PHQ2-9    Flowsheet Row Office Visit from 08/18/2023 in [facility] Office Visit from 05/17/2023 in [facility] Office Visit from 03/24/2023 in [facility] Office Visit from 03/18/2023 in [facility] Office Visit from 02/16/2023 in [facility] [place]  PHQ-2 Total Score 0 0 2 2 1   PHQ-9 Total Score -- 2 10 6  4       Flowsheet Row Video Visit from 09/02/2023 in [facility] Admission (Discharged) from 07/08/2023 in [facility] Video Visit from 06/04/2023 in [facility]  C-SSRS RISK CATEGORY No Risk No Risk No Risk        Assessment and Plan:Gabriell Troutt is a 53 year old Caucasian female, divorced, has a history of depression, PTSD, history of comorbid substance use currently in remission was evaluated by telemedicine today.  Discussed assessment and plan as noted below.  Assessment & Plan Major Depressive Disorder, recurrent, in remission Currently in remission with stable mood and no depressive symptoms. No suicidal ideation or hallucinations. Reports excessive sleep likely due to Depakote . Recent hospitalization in 2024 for depression necessitates caution with medication changes. Values mood stabilization and reduction in racing thoughts provided by Depakote . -  Continue Depakote  extended release 750 mg daily (Depakote  level-54-therapeutic-05/17/2023) - Continue Duloxetine  20 mg twice daily - Consider tapering off psychotropic medications in the future since mood symptoms are stable. - Schedule follow-up appointment in three months.  PTSD-stable Well-managed with current medication regimen, effectively controlling racing thoughts and anxiety. - Continue Trazodone  50 mg at bedtime as needed - Continue Hydroxyzine  25 mg 3 times a day as needed - Continue therapy appointment with Mr. Christiane Cowing Tschupp  Labs reviewed and discussed with patient dated 08/18/2023-hemoglobin A1c-6.1 elevated, CBC-within normal limits, CMP-within normal limits, including AST/ALT, TSH and T4-within normal limits.  Continue follow-up with nutritionist as scheduled.  Follow-up in clinic in 2 to 3 months or sooner if needed.  Collaboration of Care: Collaboration of Care: Referral or follow-up with  counselor/therapist AEB patient and patient to continue CBT  Patient/Guardian was advised Release of Information must be obtained prior to any record release in order to collaborate their care with an outside provider. Patient/Guardian was advised if they have not already done so to contact the registration department to sign all necessary forms in order for us  to release information regarding their care.   Consent: Patient/Guardian gives verbal consent for treatment and assignment of benefits for services provided during this visit. Patient/Guardian expressed understanding and agreed to proceed.  This note was generated in part or whole with voice recognition software. Voice recognition is usually quite accurate but there are transcription errors that can and very often do occur. I apologize for any typographical errors that were not detected and corrected.     Litha Lamartina, MD 09/03/2023, 3:35 PM

## 2023-09-03 ENCOUNTER — Telehealth: Payer: Self-pay | Admitting: Orthopedic Surgery

## 2023-09-03 NOTE — Telephone Encounter (Signed)
 Dr. Ernesta Heading pt - pt lvm 09/03/23 at 1:31pm stating that she has been approved for surgery by her GP and she hasn't heard anything.  She would like to get her surgery scheduled.  [phone]

## 2023-09-06 ENCOUNTER — Ambulatory Visit: Payer: 59 | Admitting: Cardiovascular Disease

## 2023-09-09 NOTE — Telephone Encounter (Signed)
 Dr. Ernesta Heading pt - spoke w/the pt, she stated she called last week about clearance for her surgery and she has not heard back.

## 2023-09-10 NOTE — Telephone Encounter (Signed)
 Spoke w/ pt and she would like to schedule for 10/07/23. Has to coordinate with care and Thursdays work best.

## 2023-10-01 ENCOUNTER — Telehealth: Payer: Self-pay | Admitting: Orthopedic Surgery

## 2023-10-01 ENCOUNTER — Ambulatory Visit: Payer: Self-pay | Admitting: Orthopedic Surgery

## 2023-10-01 ENCOUNTER — Other Ambulatory Visit: Payer: Self-pay

## 2023-10-01 DIAGNOSIS — S83271D Complex tear of lateral meniscus, current injury, right knee, subsequent encounter: Secondary | ICD-10-CM

## 2023-10-01 DIAGNOSIS — M25361 Other instability, right knee: Secondary | ICD-10-CM

## 2023-10-01 NOTE — Telephone Encounter (Signed)
 Dr. Ernesta Heading pt - pt called back again and stated that she called pre-op and they do not have her scheduled for surgery.  She wants to know what is going on that she has already taken off work.

## 2023-10-01 NOTE — Telephone Encounter (Signed)
 Called and left pt VM stating we've put her on the schedule to proceed with surgery as planned.

## 2023-10-01 NOTE — Telephone Encounter (Signed)
 Dr. Ernesta Montes pt - spoke w/the pt, she stated she is supposed to be having knee surgery next Thursday and she hasn't heard anything from our office or pre-op.  She wants a call back [phone]

## 2023-10-04 NOTE — Patient Instructions (Signed)
 Sandra Montes  10/04/2023     @PREFPERIOPPHARMACY @   Your procedure is scheduled on  10/07/2023.   Report to [facility] at  0600  A.M.   Call this number if you have problems the morning of surgery:  [phone]  If you experience any cold or flu symptoms such as cough, fever, chills, shortness of breath, etc. between now and your scheduled surgery, please notify us  at the above number.   Remember:  Do not eat after midnight.   You may drink clear liquids until  0330 am on 10/07/2023.    Clear liquids allowed are:                    Water, Juice (No red color; non-citric and without pulp; diabetics please choose diet or no sugar options), Carbonated beverages (diabetics please choose diet or no sugar options), Clear Tea (No creamer, milk, or cream, including half & half and powdered creamer), Black Coffee Only (No creamer, milk or cream, including half & half and powdered creamer), and Clear Sports drink (No red color; diabetics please choose diet or no sugar options)           At 0330 am on 10/07/2023, drink 1-12 oz gatorade of 12 oz of water. You can have nothing else to drink after this.    Take these medicines the morning of surgery with A SIP OF WATER                    divalproex, duloxetine, hydroxyzine, metoprolol.    Do not wear jewelry, make-up or nail polish, including gel polish,  artificial nails, or any other type of covering on natural nails (fingers and  toes).  Do not wear lotions, powders, or perfumes, or deodorant.  Do not shave 48 hours prior to surgery.  Men may shave face and neck.  Do not bring valuables to the hospital.  [facility] is not responsible for any belongings or valuables.  Contacts, dentures or bridgework may not be worn into surgery.  Leave your suitcase in the car.  After surgery it may be brought to your room.  For patients admitted to the hospital, discharge time will be determined by your treatment team.  Patients  discharged the day of surgery will not be allowed to drive home and must have someone with them for 24 hours.    Special instructions:   DO NOT smoke tobacco or vape for 24 hours before your procedure.  Please read over the following fact sheets that you were given. Pain Booklet, Coughing and Deep Breathing, Surgical Site Infection Prevention, Anesthesia Post-op Instructions, and Care and Recovery After Surgery       Arthroscopic Knee Ligament Repair, Care After After your knee surgery, it's common to have soreness, swelling, or pain. You may also have some fluid coming from the cuts that were made on your knee (incisions). Follow these instructions at home: Medicines Take over-the-counter and prescription medicines only as told by your health care provider. Ask your provider if the medicine prescribed to you: Requires you to avoid driving or using machinery. Can cause constipation. You may need to take these actions to prevent or treat constipation: Drink enough fluid to keep your pee (urine) pale yellow. Take over-the-counter or prescription medicines. Eat foods that are high in fiber, such as beans, whole grains, and fresh fruits and vegetables. Limit foods that are high in fat and processed sugars,  such as fried or sweet foods. If you have a brace: Wear the brace as told by your provider. Remove it only as told by your provider. Check the skin around the brace every day. Tell your provider about any concerns. Loosen the brace if your toes tingle, become numb, or turn cold and blue. Keep it clean and dry. Ask your provider when it's safe to drive if you have a brace on your knee. Bathing Do not take baths, swim, or use a hot tub until your provider approves. Keep your bandage dry until your provider says that it can be removed. If the brace is not waterproof: Do not let it get wet. Cover it with a watertight covering when you take a bath or shower. Incision care  Follow  instructions from your provider about how to take care of your incisions. Make sure you: Wash your hands with soap and water for at least 20 seconds before and after you change your dressing. If soap and water are not available, use hand sanitizer. Change your dressing as told by your provider. Leave stitches, skin glue, or tape strips in place. These skin closures may need to stay in place for 2 weeks or longer. If tape strip edges start to loosen and curl up, you may trim the loose edges. Do not remove tape strips completely unless your provider tells you to do that. Check your incision areas every day for signs of infection. Check for: Redness. More swelling or pain. Blood or more fluid. Warmth. Pus or a bad smell. Managing pain, stiffness, and swelling  If told, put ice on the affected area. If you have a removable brace, remove it as told by your provider. Put ice in a plastic bag. Place a towel between your skin and the bag. Leave the ice on for 20 minutes, 2-3 times a day. If your skin turns bright red, remove the ice right away to prevent skin damage. The risk of damage is higher if you can't feel pain, heat, or cold. Move your toes often to reduce stiffness and swelling. Raise the injured area above the level of your heart while you are sitting or lying down. Activity Do not use your knee to walk until your provider says that you can. Use crutches or other devices as told by your provider. You will work with a physical therapist to do exercises. This will make your knee move better and get stronger. Your provider will tell you: When you may do exercises that move parts of your body. These are called motion exercises. When you may start to ride a stationary bike and other gentle exercises. When you may start to do harder exercises, such as jogging. You may have to avoid lifting. Ask your provider how much you can safely lift. Return to your normal activities as told by your  provider. Ask your provider what activities are safe for you. General instructions Do not use any products that contain nicotine or tobacco. These products include cigarettes, chewing tobacco, and vaping devices, such as e-cigarettes. If you need help quitting, ask your provider. Wear compression stockings as told by your provider. These stockings help to prevent blood clots and reduce swelling in your leg. Keep all follow-up visits. Your provider will check that your knee is healing well. Contact a health care provider if: You have signs of infection in the cuts that were made on your knee. Signs include swelling, redness, warmth, pus, or a bad smell. You have a fever  or chills. You have pain that does not get better with medicine. The cuts in your knee open up. Get help right away if: You have trouble breathing. You have chest pain. You have worse pain or swelling in your calf or at the back of your knee. Your knee, foot, ankle, or toes tingle or become numb. Your foot or toes look darker than normal or are cooler than normal. These symptoms may be an emergency. Get help right away. Call 911. Do not wait to see if the symptoms will go away. Do not drive yourself to the hospital. This information is not intended to replace advice given to you by your health care provider. Make sure you discuss any questions you have with your health care provider. Document Revised: 07/15/2022 Document Reviewed: 07/15/2022 Elsevier Patient Education  2024 [employer]General Anesthesia, Adult, Care After The following information offers guidance on how to care for yourself after your procedure. Your health care provider may also give you more specific instructions. If you have problems or questions, contact your health care provider. What can I expect after the procedure? After the procedure, it is common for people to: Have pain or discomfort at the IV site. Have nausea or vomiting. Have a sore throat  or hoarseness. Have trouble concentrating. Feel cold or chills. Feel weak, sleepy, or tired (fatigue). Have soreness and body aches. These can affect parts of the body that were not involved in surgery. Follow these instructions at home: For the time period you were told by your health care provider:  Rest. Do not participate in activities where you could fall or become injured. Do not drive or use machinery. Do not drink alcohol. Do not take sleeping pills or medicines that cause drowsiness. Do not make important decisions or sign legal documents. Do not take care of children on your own. General instructions Drink enough fluid to keep your urine pale yellow. If you have sleep apnea, surgery and certain medicines can increase your risk for breathing problems. Follow instructions from your health care provider about wearing your sleep device: Anytime you are sleeping, including during daytime naps. While taking prescription pain medicines, sleeping medicines, or medicines that make you drowsy. Return to your normal activities as told by your health care provider. Ask your health care provider what activities are safe for you. Take over-the-counter and prescription medicines only as told by your health care provider. Do not use any products that contain nicotine or tobacco. These products include cigarettes, chewing tobacco, and vaping devices, such as e-cigarettes. These can delay incision healing after surgery. If you need help quitting, ask your health care provider. Contact a health care provider if: You have nausea or vomiting that does not get better with medicine. You vomit every time you eat or drink. You have pain that does not get better with medicine. You cannot urinate or have bloody urine. You develop a skin rash. You have a fever. Get help right away if: You have trouble breathing. You have chest pain. You vomit blood. These symptoms may be an emergency. Get help right  away. Call 911. Do not wait to see if the symptoms will go away. Do not drive yourself to the hospital. Summary After the procedure, it is common to have a sore throat, hoarseness, nausea, vomiting, or to feel weak, sleepy, or fatigue. For the time period you were told by your health care provider, do not drive or use machinery. Get help right away if you have difficulty  breathing, have chest pain, or vomit blood. These symptoms may be an emergency. This information is not intended to replace advice given to you by your health care provider. Make sure you discuss any questions you have with your health care provider. Document Revised: 07/18/2021 Document Reviewed: 07/18/2021 Elsevier Patient Education  2024 [employer].How to Use an Incentive Spirometer An incentive spirometer is a tool that measures how well you are filling your lungs with each breath. Learning to take long, deep breaths using this tool can help you keep your lungs clear and active. This may help to reverse or lessen your chance of developing breathing (pulmonary) problems, especially infection. You may be asked to use a spirometer: After a surgery. If you have a lung problem or a history of smoking. After a long period of time when you have been unable to move or be active. If the spirometer includes an indicator to show the highest number that you have reached, your health care provider or respiratory therapist will help you set a goal. Keep a log of your progress as told by your health care provider. What are the risks? Breathing too quickly may cause dizziness or cause you to pass out. Take your time so you do not get dizzy or light-headed. If you are in pain, you may need to take pain medicine before doing incentive spirometry. It is harder to take a deep breath if you are having pain. How to use your incentive spirometer  Sit up on the edge of your bed or on a chair. Hold the incentive spirometer so that it is in an  upright position. Before you use the spirometer, breathe out normally. Place the mouthpiece in your mouth. Make sure your lips are closed tightly around it. Breathe in slowly and as deeply as you can through your mouth, causing the piston or the ball to rise toward the top of the chamber. Hold your breath for 3-5 seconds, or for as long as possible. If the spirometer includes a coach indicator, use this to guide you in breathing. Slow down your breathing if the indicator goes above the marked areas. Remove the mouthpiece from your mouth and breathe out normally. The piston or ball will return to the bottom of the chamber. Rest for a few seconds, then repeat the steps 10 or more times. Take your time and take a few normal breaths between deep breaths so that you do not get dizzy or light-headed. Do this every 1-2 hours when you are awake. If the spirometer includes a goal marker to show the highest number you have reached (best effort), use this as a goal to work toward during each repetition. After each set of 10 deep breaths, cough a few times. This will help to make sure that your lungs are clear. If you have an incision on your chest or abdomen from surgery, place a pillow or a rolled-up towel firmly against the incision when you cough. This can help to reduce pain while taking deep breaths and coughing. General tips When you are able to get out of bed: Walk around often. Continue to take deep breaths and cough in order to clear your lungs. Keep using the incentive spirometer until your health care provider says it is okay to stop using it. If you have been in the hospital, you may be told to keep using the spirometer at home. Contact a health care provider if: You are having difficulty using the spirometer. You have trouble using the  spirometer as often as instructed. Your pain medicine is not giving enough relief for you to use the spirometer as told. You have a fever. Get help right away  if: You develop shortness of breath. You develop a cough with bloody mucus from the lungs. You have fluid or blood coming from an incision site after you cough. Summary An incentive spirometer is a tool that can help you learn to take long, deep breaths to keep your lungs clear and active. You may be asked to use a spirometer after a surgery, if you have a lung problem or a history of smoking, or if you have been inactive for a long period of time. Use your incentive spirometer as instructed every 1-2 hours while you are awake. If you have an incision on your chest or abdomen, place a pillow or a rolled-up towel firmly against your incision when you cough. This will help to reduce pain. Get help right away if you have shortness of breath, you cough up bloody mucus, or blood comes from your incision when you cough. This information is not intended to replace advice given to you by your health care provider. Make sure you discuss any questions you have with your health care provider. Document Revised: 02/26/2023 Document Reviewed: 02/26/2023 Elsevier Patient Education  2024 [employer].

## 2023-10-05 ENCOUNTER — Encounter (HOSPITAL_COMMUNITY): Payer: Self-pay

## 2023-10-05 ENCOUNTER — Other Ambulatory Visit: Payer: Self-pay

## 2023-10-05 ENCOUNTER — Encounter (HOSPITAL_COMMUNITY)
Admission: RE | Admit: 2023-10-05 | Discharge: 2023-10-05 | Disposition: A | Discharge status: Home / Self Care | Source: Ambulatory Visit | Attending: Orthopedic Surgery | Admitting: Orthopedic Surgery

## 2023-10-06 ENCOUNTER — Other Ambulatory Visit: Payer: Self-pay | Admitting: Psychiatry

## 2023-10-06 DIAGNOSIS — F3342 Major depressive disorder, recurrent, in full remission: Secondary | ICD-10-CM

## 2023-10-07 ENCOUNTER — Ambulatory Visit (HOSPITAL_COMMUNITY)
Admission: RE | Admit: 2023-10-07 | Discharge: 2023-10-07 | Disposition: A | Discharge status: Home / Self Care | Attending: Orthopedic Surgery | Admitting: Orthopedic Surgery

## 2023-10-07 ENCOUNTER — Encounter (HOSPITAL_COMMUNITY)
Admission: RE | Disposition: A | Discharge status: Home / Self Care | Payer: Self-pay | Source: Home / Self Care | Attending: Orthopedic Surgery

## 2023-10-07 ENCOUNTER — Ambulatory Visit (HOSPITAL_COMMUNITY): Admitting: Anesthesiology

## 2023-10-07 ENCOUNTER — Encounter (HOSPITAL_COMMUNITY): Payer: Self-pay | Admitting: Orthopedic Surgery

## 2023-10-07 ENCOUNTER — Other Ambulatory Visit: Payer: Self-pay

## 2023-10-07 ENCOUNTER — Encounter: Payer: Self-pay | Admitting: Orthopedic Surgery

## 2023-10-07 DIAGNOSIS — F419 Anxiety disorder, unspecified: Secondary | ICD-10-CM | POA: Insufficient documentation

## 2023-10-07 DIAGNOSIS — F32A Depression, unspecified: Secondary | ICD-10-CM | POA: Diagnosis not present

## 2023-10-07 DIAGNOSIS — S83281A Other tear of lateral meniscus, current injury, right knee, initial encounter: Secondary | ICD-10-CM | POA: Diagnosis not present

## 2023-10-07 DIAGNOSIS — M23321 Other meniscus derangements, posterior horn of medial meniscus, right knee: Secondary | ICD-10-CM

## 2023-10-07 DIAGNOSIS — Z87891 Personal history of nicotine dependence: Secondary | ICD-10-CM | POA: Insufficient documentation

## 2023-10-07 DIAGNOSIS — I2510 Atherosclerotic heart disease of native coronary artery without angina pectoris: Secondary | ICD-10-CM | POA: Insufficient documentation

## 2023-10-07 DIAGNOSIS — M23241 Derangement of anterior horn of lateral meniscus due to old tear or injury, right knee: Secondary | ICD-10-CM | POA: Diagnosis not present

## 2023-10-07 DIAGNOSIS — X58XXXA Exposure to other specified factors, initial encounter: Secondary | ICD-10-CM | POA: Insufficient documentation

## 2023-10-07 DIAGNOSIS — S83241A Other tear of medial meniscus, current injury, right knee, initial encounter: Secondary | ICD-10-CM | POA: Insufficient documentation

## 2023-10-07 DIAGNOSIS — M6751 Plica syndrome, right knee: Secondary | ICD-10-CM

## 2023-10-07 DIAGNOSIS — S83231A Complex tear of medial meniscus, current injury, right knee, initial encounter: Secondary | ICD-10-CM | POA: Diagnosis not present

## 2023-10-07 DIAGNOSIS — I10 Essential (primary) hypertension: Secondary | ICD-10-CM | POA: Insufficient documentation

## 2023-10-07 DIAGNOSIS — J449 Chronic obstructive pulmonary disease, unspecified: Secondary | ICD-10-CM | POA: Insufficient documentation

## 2023-10-07 HISTORY — PX: KNEE ARTHROSCOPY WITH MEDIAL MENISECTOMY: SHX5651

## 2023-10-07 SURGERY — ARTHROSCOPY, KNEE, WITH MEDIAL MENISCECTOMY
Anesthesia: General | Site: Knee | Laterality: Right

## 2023-10-07 MED ORDER — SODIUM CHLORIDE 0.9 % IR SOLN
Status: DC | PRN
  Administered 2023-10-07 (×2): 3000 mL

## 2023-10-07 MED ORDER — PROPOFOL 10 MG/ML IV BOLUS
INTRAVENOUS | Status: DC | PRN
Start: 2023-10-07 — End: 2023-10-07
  Administered 2023-10-07: 20 mg via INTRAVENOUS
  Administered 2023-10-07: 100 mg via INTRAVENOUS
  Administered 2023-10-07: 200 mg via INTRAVENOUS

## 2023-10-07 MED ORDER — ASPIRIN 81 MG PO TBEC: 81.0000 mg | DELAYED_RELEASE_TABLET | Freq: Two times a day (BID) | ORAL | 0 refills | Status: AC

## 2023-10-07 MED ORDER — CHLORHEXIDINE GLUCONATE 0.12 % MT SOLN: 15.0000 mL | Freq: Once | OROMUCOSAL | Status: DC

## 2023-10-07 MED ORDER — DEXMEDETOMIDINE HCL IN NACL 80 MCG/20ML IV SOLN
INTRAVENOUS | Status: AC
  Filled 2023-10-07: qty 20

## 2023-10-07 MED ORDER — EPHEDRINE 5 MG/ML INJ
INTRAVENOUS | Status: AC
  Filled 2023-10-07: qty 5

## 2023-10-07 MED ORDER — ORAL CARE MOUTH RINSE: 15.0000 mL | Freq: Once | OROMUCOSAL | Status: DC

## 2023-10-07 MED ORDER — PHENYLEPHRINE 80 MCG/ML (10ML) SYRINGE FOR IV PUSH (FOR BLOOD PRESSURE SUPPORT)
PREFILLED_SYRINGE | INTRAVENOUS | Status: DC | PRN
Start: 2023-10-07 — End: 2023-10-07
  Administered 2023-10-07: 80 ug via INTRAVENOUS

## 2023-10-07 MED ORDER — FENTANYL CITRATE PF 50 MCG/ML IJ SOSY: 25.0000 ug | PREFILLED_SYRINGE | INTRAMUSCULAR | Status: DC | PRN

## 2023-10-07 MED ORDER — PHENYLEPHRINE 80 MCG/ML (10ML) SYRINGE FOR IV PUSH (FOR BLOOD PRESSURE SUPPORT)
PREFILLED_SYRINGE | INTRAVENOUS | Status: AC
Start: 2023-10-07 — End: ?
  Filled 2023-10-07: qty 10

## 2023-10-07 MED ORDER — LACTATED RINGERS IV SOLN: INTRAVENOUS | Status: DC | PRN

## 2023-10-07 MED ORDER — ONDANSETRON HCL 4 MG/2ML IJ SOLN
INTRAMUSCULAR | Status: DC | PRN
Start: 2023-10-07 — End: 2023-10-07
  Administered 2023-10-07: 4 mg via INTRAVENOUS

## 2023-10-07 MED ORDER — ONDANSETRON HCL 4 MG/2ML IJ SOLN
INTRAMUSCULAR | Status: AC
  Filled 2023-10-07: qty 2

## 2023-10-07 MED ORDER — HYDROCODONE-ACETAMINOPHEN 5-325 MG PO TABS: 1.0000 | ORAL_TABLET | Freq: Four times a day (QID) | ORAL | 0 refills | Status: AC | PRN

## 2023-10-07 MED ORDER — ONDANSETRON HCL 4 MG PO TABS: 4.0000 mg | ORAL_TABLET | Freq: Three times a day (TID) | ORAL | 0 refills | Status: AC | PRN

## 2023-10-07 MED ORDER — FENTANYL CITRATE (PF) 100 MCG/2ML IJ SOLN
INTRAMUSCULAR | Status: DC | PRN
  Administered 2023-10-07: 25 ug via INTRAVENOUS
  Administered 2023-10-07: 50 ug via INTRAVENOUS
  Administered 2023-10-07: 25 ug via INTRAVENOUS
  Administered 2023-10-07: 50 ug via INTRAVENOUS
  Administered 2023-10-07: 25 ug via INTRAVENOUS

## 2023-10-07 MED ORDER — OXYCODONE HCL 5 MG/5ML PO SOLN: 5.0000 mg | Freq: Once | ORAL | Status: DC | PRN

## 2023-10-07 MED ORDER — ONDANSETRON HCL 4 MG/2ML IJ SOLN: 4.0000 mg | Freq: Once | INTRAMUSCULAR | Status: DC | PRN

## 2023-10-07 MED ORDER — LIDOCAINE 2% (20 MG/ML) 5 ML SYRINGE
INTRAMUSCULAR | Status: DC | PRN
  Administered 2023-10-07: 100 mg via INTRAVENOUS

## 2023-10-07 MED ORDER — PROPOFOL 10 MG/ML IV BOLUS
INTRAVENOUS | Status: AC
  Filled 2023-10-07: qty 20

## 2023-10-07 MED ORDER — FENTANYL CITRATE (PF) 100 MCG/2ML IJ SOLN
INTRAMUSCULAR | Status: AC
Start: 2023-10-07 — End: ?
  Filled 2023-10-07: qty 2

## 2023-10-07 MED ORDER — CEFAZOLIN SODIUM-DEXTROSE 2-4 GM/100ML-% IV SOLN
2.0000 g | INTRAVENOUS | Status: AC
  Administered 2023-10-07: 2 g via INTRAVENOUS
  Filled 2023-10-07: qty 100

## 2023-10-07 MED ORDER — DEXMEDETOMIDINE HCL IN NACL 80 MCG/20ML IV SOLN
INTRAVENOUS | Status: DC | PRN
  Administered 2023-10-07: 10 ug via INTRAVENOUS

## 2023-10-07 MED ORDER — EPHEDRINE SULFATE-NACL 50-0.9 MG/10ML-% IV SOSY
PREFILLED_SYRINGE | INTRAVENOUS | Status: DC | PRN
  Administered 2023-10-07: 10 mg via INTRAVENOUS
  Administered 2023-10-07: 5 mg via INTRAVENOUS
  Administered 2023-10-07: 10 mg via INTRAVENOUS

## 2023-10-07 MED ORDER — DEXAMETHASONE SODIUM PHOSPHATE 10 MG/ML IJ SOLN
INTRAMUSCULAR | Status: AC
  Filled 2023-10-07: qty 1

## 2023-10-07 MED ORDER — LACTATED RINGERS IV SOLN: INTRAVENOUS | Status: DC

## 2023-10-07 MED ORDER — LIDOCAINE 2% (20 MG/ML) 5 ML SYRINGE
INTRAMUSCULAR | Status: AC
Start: 2023-10-07 — End: ?
  Filled 2023-10-07: qty 5

## 2023-10-07 MED ORDER — EPINEPHRINE PF 1 MG/ML IJ SOLN
INTRAMUSCULAR | Status: AC
  Filled 2023-10-07: qty 5

## 2023-10-07 MED ORDER — OXYCODONE HCL 5 MG PO TABS: 5.0000 mg | ORAL_TABLET | Freq: Once | ORAL | Status: DC | PRN

## 2023-10-07 MED ORDER — FENTANYL CITRATE (PF) 100 MCG/2ML IJ SOLN
INTRAMUSCULAR | Status: AC
  Filled 2023-10-07: qty 2

## 2023-10-07 MED ORDER — DEXAMETHASONE SODIUM PHOSPHATE 10 MG/ML IJ SOLN
INTRAMUSCULAR | Status: DC | PRN
  Administered 2023-10-07: 5 mg via INTRAVENOUS

## 2023-10-07 MED ORDER — BUPIVACAINE-EPINEPHRINE (PF) 0.5% -1:200000 IJ SOLN
INTRAMUSCULAR | Status: DC | PRN
  Administered 2023-10-07: 30 mL via PERINEURAL

## 2023-10-07 MED ORDER — SODIUM CHLORIDE 0.9 % IR SOLN
Status: DC | PRN
  Administered 2023-10-07: 1000 mL

## 2023-10-07 SURGICAL SUPPLY — 49 items
BAG HAMPER (MISCELLANEOUS) ×1 IMPLANT
BANDAGE ESMARK 6X9 LF (GAUZE/BANDAGES/DRESSINGS) ×1 IMPLANT
BLADE SURG SZ11 CARB STEEL (BLADE) ×1 IMPLANT
BNDG ELASTIC 6X5.8 VLCR NS LF (GAUZE/BANDAGES/DRESSINGS) ×2 IMPLANT
CHLORAPREP W/TINT 26 (MISCELLANEOUS) ×1 IMPLANT
CLOTH BEACON ORANGE TIMEOUT ST (SAFETY) ×1 IMPLANT
COOLER ICEMAN CLASSIC (MISCELLANEOUS) ×1 IMPLANT
COUNTER NDL MAGNETIC 40 RED (SET/KITS/TRAYS/PACK) ×1 IMPLANT
COUNTER NEEDLE MAGNETIC 40 RED (SET/KITS/TRAYS/PACK) ×1 IMPLANT
CUFF TRNQT CYL 34X4.125X (TOURNIQUET CUFF) ×1 IMPLANT
DECANTER SPIKE VIAL GLASS SM (MISCELLANEOUS) ×1 IMPLANT
DRAPE HALF SHEET 40X57 (DRAPES) ×1 IMPLANT
DRSG XEROFORM 1X8 (GAUZE/BANDAGES/DRESSINGS) ×1 IMPLANT
GAUZE 4X4 16PLY ~~LOC~~+RFID DBL (SPONGE) ×1 IMPLANT
GAUZE SPONGE 4X4 12PLY STRL (GAUZE/BANDAGES/DRESSINGS) ×1 IMPLANT
GAUZE XEROFORM 1X8 LF (GAUZE/BANDAGES/DRESSINGS) IMPLANT
GLOVE BIO SURGEON STRL SZ8 (GLOVE) ×3 IMPLANT
GLOVE BIOGEL PI IND STRL 7.0 (GLOVE) ×2 IMPLANT
GLOVE BIOGEL PI IND STRL 8 (GLOVE) ×1 IMPLANT
GOWN STRL REUS W/TWL LRG LVL3 (GOWN DISPOSABLE) ×1 IMPLANT
GOWN STRL REUS W/TWL XL LVL3 (GOWN DISPOSABLE) ×1 IMPLANT
KIT TURNOVER CYSTO (KITS) ×1 IMPLANT
MANIFOLD NEPTUNE II (INSTRUMENTS) ×1 IMPLANT
MARKER SKIN DUAL TIP RULER LAB (MISCELLANEOUS) ×1 IMPLANT
NDL HYPO 18GX1.5 BLUNT FILL (NEEDLE) ×1 IMPLANT
NDL HYPO 21X1.5 SAFETY (NEEDLE) ×1 IMPLANT
NDL SPNL 18GX3.5 QUINCKE PK (NEEDLE) ×1 IMPLANT
NEEDLE HYPO 18GX1.5 BLUNT FILL (NEEDLE) ×1 IMPLANT
NEEDLE HYPO 21X1.5 SAFETY (NEEDLE) ×1 IMPLANT
NEEDLE SPNL 18GX3.5 QUINCKE PK (NEEDLE) ×1 IMPLANT
NS IRRIG 1000ML POUR BTL (IV SOLUTION) ×1 IMPLANT
PACK ARTHRO LIMB DRAPE STRL (MISCELLANEOUS) ×1 IMPLANT
PACK ARTHROSCOPY WL (CUSTOM PROCEDURE TRAY) ×1 IMPLANT
PAD ABD 5X9 TENDERSORB (GAUZE/BANDAGES/DRESSINGS) ×1 IMPLANT
PAD ARMBOARD POSITIONER FOAM (MISCELLANEOUS) ×1 IMPLANT
PAD CAST 4YDX4 CTTN HI CHSV (CAST SUPPLIES) IMPLANT
PAD COLD SHLDR WRAP-ON (PAD) ×1 IMPLANT
PAD FOR LEG HOLDER (MISCELLANEOUS) ×1 IMPLANT
PADDING CAST COTTON 6X4 STRL (CAST SUPPLIES) ×1 IMPLANT
PORT APPOLLO RF 90DEGREE MULTI (SURGICAL WAND) IMPLANT
POSITIONER HEAD 8X9X4 ADT (SOFTGOODS) ×1 IMPLANT
SET ARTHROSCOPY INST (INSTRUMENTS) ×1 IMPLANT
SET BASIN LINEN APH (SET/KITS/TRAYS/PACK) ×1 IMPLANT
SOL .9 NS 3000ML IRR UROMATIC (IV SOLUTION) ×2 IMPLANT
SUT 3-0 BLK 1X30 PSL (SUTURE) ×1 IMPLANT
SYR 10ML LL (SYRINGE) ×1 IMPLANT
SYR 30ML LL (SYRINGE) ×3 IMPLANT
TUBE CONNECTING 12X1/4 (SUCTIONS) ×1 IMPLANT
TUBING IN/OUT FLOW W/MAIN PUMP (TUBING) ×1 IMPLANT

## 2023-10-07 NOTE — Op Note (Signed)
 Orthopaedic Surgery Operative Note (CSN: [id number])  Sandra Montes  1970/09/25 Date of Surgery: 10/07/2023   Diagnoses:  Right knee medial and lateral meniscus tears Medial plica  Procedure: Right knee arthroscopy, with partial medial and partial lateral meniscectomy Excision of plica over the medial femoral condyle, which has denuded the cartilage over the medial femoral condyle   Operative Finding Exam under anesthesia: Full range of motion.  Negative Lachman.  No increased laxity varus or valgus stress.  She can achieve full extension. Suprapatellar pouch: Synovitis, no loose bodies Patellofemoral Compartment: Grade 2/3 changes in both the patella, as well as the trochlea Medial Compartment: Over the medial aspect of the medial femoral condyle, the cartilage has been completely denuded, with an overlying thick plica.  Complex tear of the posterior horn extending to the posterior root, which remains intact. Lateral Compartment: Some softening of the lateral femoral condyle cartilage.  There are some fissures over the lateral femoral condyle.  A deeper fissure is noted within the weightbearing aspect of the lateral tibial plateau, with softened cartilage surrounding this. Intercondylar Notch: ACL is intact, but fibers are frayed  Successful completion of the planned procedure.  General debridement.  Excision of a medial plica overlying the medial femoral condyle.  Debridement of a posterior horn medial meniscus tear, removing approximately 25% of the meniscus in this area.  Approximately 10% of the posterior horn, adjacent to the posterior root was excised.  Degenerative fraying of the anterior horn of the lateral compartment.  Approximate 50% of the anterior horn was debrided.    Post-Op Diagnosis: Same Surgeons:Primary: Tonita Frater, MD Location: [facility] OR ROOM 4 Anesthesia: General with local anesthesia Antibiotics: Ancef 2 g Tourniquet time:  Total Tourniquet Time  Documented: Thigh (Right) - 36 minutes Total: Thigh (Right) - 36 minutes  Estimated Blood Loss: Minimal Complications: None Specimens: None  Implants: None  Indications for Surgery:   Sandra Montes is a 53 y.o. female with ongoing right knee pain.  Nonoperative measures were attempted, without improvement.  We obtained an MRI, which demonstrated meniscus tears of the medial lateral meniscus, as well as some degenerative changes.  Her pain has been recalcitrant to nonoperative measures, and we elected to proceed with surgery.  Benefits and risks of operative and nonoperative management were discussed prior to surgery with the patient and informed consent form was completed.  Specific risks including infection, need for additional surgery, bleeding, stiffness, persistent pain, progression of arthritis, blood clots and more severe complications associated with anesthesia.  She elected proceed.  Surgical consent was finalized.   Procedure:   The patient was identified properly. Informed consent was obtained and the surgical site was marked. The patient was taken up to suite where general anesthesia was induced. The patient was placed in the supine position with a post against the surgical leg and a nonsterile tourniquet applied. The surgical leg was then prepped and draped usual sterile fashion.  A standard surgical timeout was performed.  2 standard anterior portals were made and diagnostic arthroscopy performed. Please note the findings as noted above.  The knee demonstrated a lot of synovitis, diffusely.  Within the suprapatellar pouch, we debrided some bands of tissue, as well as some of the fat which appeared to be irritated.  Hemostasis was maintained.  Within the patellofemoral compartment, there was fraying of the cartilage, with fissures in both the undersurface of the patella, as well as the trochlea.  We proceeded to the medial compartment, where grade 4  changes were noted over the  most medial aspect the medial femoral condyle.  Overlying this area was a thick plica.  Using cautery, as well as a shaver, the plica was excised to prevent this abrasion.  Within the medial compartment, we noted a complex tear of the posterior horn.  This was debrided with a combination of arthroscopic basket and shaver.  Approximate 25% of posterior horn was removed.  This was debrided back to a stable rim.  The posterior horn was intact.  We then evaluated the lateral compartment.  There were fissures within the cartilage in both femoral condyle, as well as the plateau.  There was degenerative tearing of the anterior horn, which was gently debrided.  There was also a complex tear of the posterior horn adjacent to the root, which was debrided with combination of shaver and electrocautery.  There were no loose bodies.  Once again, synovitis was noted within the medial lateral gutters, as well as the suprapatellar pouch.  Minimal bleeding within the anterior fat pad.  Nothing else was needed within the knee.  Instruments in the camera were removed.  Incisions closed with 3-0 nylon.  Portals were injected with 0.5% Marcaine.  The patient was awoken from general anesthesia and taken to the PACU in stable condition without complication.    Post-operative plan:  The patient will be weightbearing as tolerated, range of motion as tolerated. The patient will be discharged from the PACU when she has recovered. DVT prophylaxis Aspirin 81 mg twice daily for 6 weeks.  Pain control with PRN pain medication preferring oral medicines.  Follow up plan will be scheduled in approximately 10-14 days for incision check and XR.

## 2023-10-07 NOTE — Anesthesia Procedure Notes (Signed)
 Procedure Name: LMA Insertion Date/Time: 10/07/2023 7:37 AM  Performed by: Verline Glow, CRNAPre-anesthesia Checklist: Patient identified, Patient being monitored, Emergency Drugs available, Timeout performed and Suction available Patient Re-evaluated:Patient Re-evaluated prior to induction Oxygen Delivery Method: Circle System Utilized Preoxygenation: Pre-oxygenation with 100% oxygen Induction Type: IV induction Ventilation: Mask ventilation without difficulty LMA: LMA inserted LMA Size: 4.0 Number of attempts: 1 Placement Confirmation: positive ETCO2 and breath sounds checked- equal and bilateral

## 2023-10-07 NOTE — Discharge Instructions (Addendum)
 Sandra Cornia A. Ernesta Heading, MD MS [facility] [address] [place],  [place]  [address] Phone: [phone] Fax:  [phone]    POST-OPERATIVE INSTRUCTIONS - Knee Arthroscopy  WOUND CARE - You may remove the Operative Dressing on Post-Op Day #3 (72hrs after surgery).  Alternatively if you would like you can leave dressing on until follow-up if within 7-8 days but keep it dry. - Leave steri-strips in place until they fall off on their own, usually 2 weeks postop. - An ACE wrap may be used to control swelling, do not wrap this too tight.  If the initial ACE wrap feels too tight you may loosen it. - There may be a small amount of fluid/bleeding leaking at the surgical site. This is normal; the knee is filled with fluid during the procedure and can leak for 24-48hrs after surgery. You may change/reinforce the bandage as needed.  - Use the Cryocuff or Ice as often as possible for the first 7 days, then as needed for pain relief. Always keep a towel, ACE wrap or other barrier between the cooling unit and your skin.  - You may shower on Post-Op Day #3. Gently pat the area dry. Do not soak the knee in water or submerge it. Do not go swimming in the pool or ocean until 4 weeks after surgery or when otherwise instructed.  Keep dry incisions as dry as possible.   BRACE/AMBULATION -  You can use your brace until your nerve block wears off.  At that time you can remove it. -            Unless otherwise specified, you will not need a brace after this procedure.    REGIONAL ANESTHESIA (NERVE BLOCKS) - The anesthesia team may have performed a nerve block for you if safe in the setting of your care.  This is a great tool used to minimize pain.  Typically the block may start wearing off overnight.  This can be a challenging period but please utilize your as needed pain medications to try and manage this period and know it will be a brief transition as the nerve block wears completely    POST-OP MEDICATIONS - Multimodal approach to pain control - In general your pain will be controlled with a combination of substances.  Prescriptions unless otherwise discussed are electronically sent to your pharmacy.  This is a carefully made plan we use to minimize narcotic use.    - Ibuprofen - Anti-inflammatory medication taken as needed - Acetaminophen - Non-narcotic pain medicine taken as needed - Hydrocodone - This is a strong narcotic, to be used only on an as needed basis for pain. - Aspirin 81mg  - This medicine is used to minimize the risk of blood clots after surgery. -  Zofran - take as needed for nausea  FOLLOW-UP   Please call the office to schedule a follow-up appointment for your incision check, 7-10 days post-operatively.   IF YOU HAVE ANY QUESTIONS, PLEASE FEEL FREE TO CALL OUR OFFICE.   HELPFUL INFORMATION  - If you had a block, it will wear off between 8-24 hrs postop typically.  This is period when your pain may go from nearly zero to the pain you would have had post-op without the block.  This is an abrupt transition but nothing dangerous is happening.  You may take an extra dose of narcotic when this happens.   Keep your leg elevated to decrease swelling, which will then in turn  decrease your pain. I would elevate the foot of your bed by putting a couple of couch pillows between your mattress and box spring. I would not keep pillow directly under your ankle.  - Do not sleep with a pillow behind your knee even if it is more comfortable as this may make it harder to get your knee fully straight long term.   There will be MORE swelling on days 1-3 than there is on the day of surgery.  This also is normal. The swelling will decrease with the anti-inflammatory medication, ice and keeping it elevated. The swelling will make it more difficult to bend your knee. As the swelling goes down your motion will become easier   You may develop swelling and bruising that  extends from your knee down to your calf and perhaps even to your foot over the next week. Do not be alarmed. This too is normal, and it is due to gravity   There may be some numbness adjacent to the incision site. This may last for 6-12 months or longer in some patients and is expected.   You may return to sedentary work/school in the next couple of days when you feel up to it. You will need to keep your leg elevated as much as possible    You should wean off your narcotic medicines as soon as you are able.  Most patients will be off or using minimal narcotics before their first postop appointment.    We suggest you use the pain medication the first night prior to going to bed, in order to ease any pain when the anesthesia wears off. You should avoid taking pain medications on an empty stomach as it will make you nauseous.   Do not drink alcoholic beverages or take illicit drugs when taking pain medications.   It is against the law to drive while taking narcotics. You cannot drive if your Right leg is in brace locked in extension.   Pain medication may make you constipated.  Below are a few solutions to try in this order:  o Decrease the amount of pain medication if you aren't having pain.  o Drink lots of decaffeinated fluids.  o Drink prune juice and/or each dried prunes   o If the first 3 don't work start with additional solutions  o Take Colace - an over-the-counter stool softener  o Take Senokot - an over-the-counter laxative  o Take Miralax - a stronger over-the-counter laxative    Narcotic pain medicine  Per [facility] clinic policy, our goal is ensure optimal postoperative pain control with a multimodal pain management strategy.   For all [facility] patients, our goal is to wean post-operative narcotic medications by 6 weeks post-operatively. If this is not possible due to utilization of pain medication prior to surgery, your [facility] doctor will support your acute  post-operative pain control for the first 6 weeks postoperatively, with a plan to transition you back to your primary pain team following that. Sandra Spain will work to ensure a Therapist, occupational.

## 2023-10-07 NOTE — Interval H&P Note (Signed)
 History and Physical Interval Note:  10/07/2023 7:06 AM  Sandra Montes  has presented today for surgery, with the diagnosis of Right knee medial and lateral meniscus tears.  The various methods of treatment have been discussed with the patient and family. After consideration of risks, benefits and other options for treatment, the patient has consented to  Procedure(s): ARTHROSCOPY, KNEE, WITH MEDIAL MENISCECTOMY (Right) as a surgical intervention.  The patient's history has been reviewed, patient examined, no change in status, stable for surgery.  I have reviewed the patient's chart and labs.  Questions were answered to the patient's satisfaction.    She has undergone a medical workup for clearance and had abdominal surgery since I last saw her.  She continues to have right knee pain.  MRI demonstrates meniscus tears in the medial and lateral compartments.  She has elected to proceed with arthroscopy.  All questions have been answered.   Tonita Frater

## 2023-10-07 NOTE — Transfer of Care (Signed)
 Immediate Anesthesia Transfer of Care Note  Patient: Sandra Montes  Procedure(s) Performed: ARTHROSCOPY, KNEE, WITH MEDIAL MENISCECTOMY (Right: Knee)  Patient Location: PACU  Anesthesia Type:General  Level of Consciousness: awake and patient cooperative  Airway & Oxygen Therapy: Patient Spontanous Breathing and Patient connected to nasal cannula oxygen  Post-op Assessment: Report given to RN and Post -op Vital signs reviewed and stable  Post vital signs: Reviewed and stable  Last Vitals:  Vitals Value Taken Time  BP 83/48 10/07/23 0845  Temp 36.4 C 10/07/23 0843  Pulse 70 10/07/2023  0848  Resp 9 10/07/23 0847  SpO2 96 10/07/2023  0849  Vitals shown include unfiled device data.  Last Pain:  Vitals:   10/07/23 0631  TempSrc: Oral  PainSc: 0-No pain         Complications: No notable events documented.

## 2023-10-07 NOTE — Anesthesia Postprocedure Evaluation (Signed)
 Anesthesia Post Note  Patient: Sandra Montes  Procedure(s) Performed: ARTHROSCOPY, KNEE, WITH MEDIAL MENISCECTOMY (Right: Knee)  Patient location during evaluation: Phase II Anesthesia Type: General Level of consciousness: awake Pain management: pain level controlled Vital Signs Assessment: post-procedure vital signs reviewed and stable Respiratory status: spontaneous breathing and respiratory function stable Cardiovascular status: blood pressure returned to baseline and stable Postop Assessment: no headache and no apparent nausea or vomiting Anesthetic complications: no Comments: Late entry   No notable events documented.   Last Vitals:  Vitals:   10/07/23 0915 10/07/23 0925  BP: 126/71 133/72  Pulse: 67 69  Resp: 14 16  Temp:  (!) 36.3 C  SpO2: 100% 100%    Last Pain:  Vitals:   10/07/23 0925  TempSrc: Axillary  PainSc: 2                  Coretha Dew

## 2023-10-07 NOTE — Brief Op Note (Signed)
 10/07/2023  8:40 AM  PATIENT:  Sandra Montes  53 y.o. female  PRE-OPERATIVE DIAGNOSIS:  Right knee medial and lateral meniscus tears  POST-OPERATIVE DIAGNOSIS:  Right knee medial and lateral meniscus tears, medial plica  PROCEDURE:  Procedure(s): ARTHROSCOPY, KNEE, WITH MEDIAL MENISCECTOMY (Right)  SURGEON:  Surgeons and Role:    Tonita Frater, MD - Primary  PHYSICIAN ASSISTANT:  N/A  ASSISTANTS: none   ANESTHESIA:   none  EBL:  5 mL   BLOOD ADMINISTERED:none  DRAINS: none   LOCAL MEDICATIONS USED:  MARCAINE     SPECIMEN:  No Specimen  DISPOSITION OF SPECIMEN:  N/A  COUNTS:  YES  TOURNIQUET:   Total Tourniquet Time Documented: Thigh (Right) - 36 minutes Total: Thigh (Right) - 36 minutes   DICTATION: .Note written in EPIC  PLAN OF CARE: Discharge to home after PACU  PATIENT DISPOSITION:  PACU - hemodynamically stable.   Delay start of Pharmacological VTE agent (>24hrs) due to surgical blood loss or risk of bleeding: yes

## 2023-10-07 NOTE — Anesthesia Preprocedure Evaluation (Signed)
 Anesthesia Evaluation  Patient identified by MRN, date of birth, ID band Patient awake    Reviewed: Allergy & Precautions, H&P , NPO status , Patient's Chart, lab work & pertinent test results, reviewed documented beta blocker date and time   Airway Mallampati: II  TM Distance: >3 FB Neck ROM: full    Dental no notable dental hx.    Pulmonary COPD, former smoker   Pulmonary exam normal breath sounds clear to auscultation       Cardiovascular Exercise Tolerance: Good hypertension, + CAD and + DOE  + dysrhythmias  Rhythm:regular Rate:Normal     Neuro/Psych  PSYCHIATRIC DISORDERS Anxiety Depression    negative neurological ROS     GI/Hepatic negative GI ROS, Neg liver ROS,,,  Endo/Other  negative endocrine ROS    Renal/GU Renal disease  negative genitourinary   Musculoskeletal   Abdominal   Peds  Hematology negative hematology ROS (+)   Anesthesia Other Findings   Reproductive/Obstetrics negative OB ROS                             Anesthesia Physical Anesthesia Plan  ASA: 2  Anesthesia Plan: General and General LMA   Post-op Pain Management:    Induction:   PONV Risk Score and Plan: Ondansetron  Airway Management Planned:   Additional Equipment:   Intra-op Plan:   Post-operative Plan:   Informed Consent: I have reviewed the patients History and Physical, chart, labs and discussed the procedure including the risks, benefits and alternatives for the proposed anesthesia with the patient or authorized representative who has indicated his/her understanding and acceptance.     Dental Advisory Given  Plan Discussed with: CRNA  Anesthesia Plan Comments:        Anesthesia Quick Evaluation

## 2023-10-07 NOTE — H&P (Signed)
 Below is the most recent clinic note for Sandra Montes; any pertinent information regarding their recent medical history will be updated on the day of surgery.    Return patient Visit  Assessment: Sandra Montes is a 53 y.o. female with the following: Right knee pain; medial and lateral meniscus tears  Plan: Sandra Montes has pain in her right knee.  MRI demonstrates tears of both the medial lateral meniscus.  She does have some underlying arthritis.  She is exhausted nonoperative management, including medications, injections, therapy without resolution of her symptoms.  She is interested in surgery.  Procedure was discussed in detail.  After answering all questions, she would like to proceed with right knee arthroscopy, with partial medial and lateral meniscectomy.  We will work to obtain medical clearance, and then schedule surgery.  She states understanding.  I provided her with some prednisone, as well as Flexeril to help with her pain.  Risks and benefits of the surgery, including, but not limited to infection, bleeding, persistent pain, need for further surgery, blood clots, worsening arthritis and more severe complications associated with anesthesia were discussed with the patient.  The patient has elected to proceed.    History of Present Illness: Sandra Montes is a 53 y.o. female who returns for evaluation of right knee pain.  She continues to have difficulty.  Therapy has made her pain worse.  Occasional buckling sensations.  She does note some swelling.  It is affecting her preferred activities.  She has obtained an MRI and is here to discuss the findings.   Review of Systems: No fevers or chills No numbness or tingling No chest pain No shortness of breath No bowel or bladder dysfunction No GI distress No headaches      Objective: BP 122/79   Pulse (!) 59   Temp 98.1 F (36.7 C) (Oral)   Resp 19   Ht 5\' 7"  (1.702 m)   Wt 106.6 kg   LMP  10/04/2022 (Approximate) Comment: has period off/on  SpO2 97%   BMI 36.81 kg/m   Physical Exam:  General: Alert and oriented. and No acute distress. Gait: Normal gait.  Evaluation the right knee demonstrates a mild effusion.  She has tenderness to palpation of both the medial and lateral joint lines.  She has pain with hyperflexion.  Negative Lachman.  Pain with varus and valgus stress, but without obvious laxity.  Toes are warm and well-perfused.   IMAGING: I personally reviewed images previously obtained in clinic  Right knee MRI  IMPRESSION: 1. Mild extrusion of the body of the medial meniscus, greatest anteriorly. There is a vertically oriented tear extending through the superior and likely inferior articular surfaces within the anterior segment of the body of the medial meniscus. 2. Mild-to-moderate extrusion of the anterior segment of the body of the lateral meniscus. There is globular increased proton density signal within the peripheral third of the meniscal triangle of the anterior horn of the lateral meniscus, suggesting an intrameniscal cyst that also mildly extends as a tear through the superior articular surface. 3. Moderate-to-large joint effusion with mild synovial thickening. Tiny Baker's cyst. 4. Moderate medial and patellofemoral compartment cartilage degenerative changes.        Tonita Frater, MD  10/07/2023 7:05 AM

## 2023-10-08 ENCOUNTER — Encounter (HOSPITAL_COMMUNITY): Payer: Self-pay | Admitting: Orthopedic Surgery

## 2023-10-19 NOTE — Progress Notes (Deleted)
 Office Visit Note  Patient: Sandra Montes             Date of Birth: 04-28-1971           MRN: [id number]             PCP: Corwin Antu, FNP Referring: Corwin Antu, FNP Visit Date: 11/02/2023 Occupation: @GUAROCC @  Subjective:    History of Present Illness: EVANELL REDLICH is a 53 y.o. female with history of osteoarthritis.   Right medial meniscectomy   Activities of Daily Living:  Patient reports morning stiffness for *** {minute/hour:19697}.   Patient {ACTIONS;DENIES/REPORTS:21021675::Denies} nocturnal pain.  Difficulty dressing/grooming: {ACTIONS;DENIES/REPORTS:21021675::Denies} Difficulty climbing stairs: {ACTIONS;DENIES/REPORTS:21021675::Denies} Difficulty getting out of chair: {ACTIONS;DENIES/REPORTS:21021675::Denies} Difficulty using hands for taps, buttons, cutlery, and/or writing: {ACTIONS;DENIES/REPORTS:21021675::Denies}  No Rheumatology ROS completed.   PMFS History:  Patient Active Problem List   Diagnosis Date Noted   Prediabetes 08/18/2023   History of tobacco abuse 08/18/2023   Proteinuria 06/08/2023   Tear of lateral collateral ligament of right knee 05/17/2023   Acute meniscal tear of knee, right, sequela 05/17/2023   Electrocardiogram showing T wave abnormalities 05/17/2023   DOE (dyspnea on exertion) 05/17/2023   Abnormal weight gain 05/17/2023   Pre-op evaluation 05/17/2023   PTSD (post-traumatic stress disorder) 03/24/2023   High risk medication use 03/24/2023   History of alcohol abuse 03/24/2023   History of cannabis abuse 03/24/2023   Cessation of tobacco use in previous 12 months 02/11/2023   History of suicidal ideation 01/28/2023   Sleep disorder 12/30/2022   MDD (major depressive disorder), recurrent episode, severe (HCC) 12/17/2022   Obsessional thoughts 10/20/2022   Family history of cardiac arrest 09/07/2022   Erythrocytosis 01/30/2022   Elevated hemoglobin (HCC) 01/13/2022   History of colon polyps  01/13/2022   Hot flushes, perimenopausal 01/13/2022   Anxiety state 01/13/2022   Irritable bowel syndrome with diarrhea 10/13/2021   Centrilobular emphysema (HCC) 07/04/2021   Non-seasonal allergic rhinitis due to pollen 07/04/2021   Dyslipidemia 12/29/2018   Essential hypertension 12/29/2018    Past Medical History:  Diagnosis Date   Adnexal mass 05/17/2022   a.) CT A/P 05/18/2023: 17.0 x 16.3 x 12.8 cm --> favored to be a large benign fluid filled ovarian cyst --> surgical resection recommended/pending   Anxiety    Aortic atherosclerosis (HCC)    Bartholin's gland abscess    CAD (coronary artery disease) 06/04/2023   a.) cCTA 06/04/2023: Ca2+ = 450 (99th %'ile) --> 27.6 LAD, 66.1 LCx, 356 RCA   Chronic left shoulder pain    Depression    Diastolic dysfunction 05/26/2023   a.) TTE 05/26/2023: EF 55-60%, no RWMAs, G1DD, triv AR/MR, mild AoC sclerosis without stenosis   DOE (dyspnea on exertion)    Emphysema lung (HCC)    History of 2019 novel coronavirus disease (COVID-19) 12/13/2020   History of alcohol abuse    History of allergy     History of cannabis abuse    History of suicidal ideation 09/14/2022   a.) scared to do it; increasing intrusive thoughts about jumping off a cliff because she is tired of fighting; (+) crying, anxiety, depression, inturrupted sleep, appetite changes related to relationship problems (broke up with BF) and what sounds like caregiver role strain (caregiver to very demanding mother); (+) polysubstance use/abuse   HLD (hyperlipidemia)    Hyperlipidemia    Hypertension    Insomnia    a.) uses trazodone  PRN   Irritable bowel syndrome with diarrhea    Nephrolithiasis  Pain in both hands    Polyarthralgia    Positive ANA (antinuclear antibody)    PTSD (post-traumatic stress disorder)    RBBB (right bundle branch block)    Sinus bradycardia    Urinary incontinence    Weight gain due to medication    a.) significant (40lb) weight gain with the  initiation of divalproex     Family History  Problem Relation Age of Onset   Depression Mother    Alcohol abuse Mother    Heart attack Mother 61   Skin cancer Father    Alcohol abuse Father    Prostate cancer Father    Rheum arthritis Paternal Grandmother    Prostate cancer Paternal Grandfather    Skin cancer Paternal Grandfather    Asthma Daughter    Mental illness Daughter        borderline   Mitral valve prolapse Daughter        & enlarged aorta   Migraines Daughter    Alcohol abuse Cousin    Healthy Son    Healthy Son    Past Surgical History:  Procedure Laterality Date   CESAREAN SECTION N/A 1993   CHOLECYSTECTOMY N/A 1996   COLONOSCOPY WITH PROPOFOL  N/A 11/18/2021   Procedure: COLONOSCOPY WITH PROPOFOL ;  Surgeon: Unk Corinn Skiff, MD;  Location: [facility];  Service: Gastroenterology;  Laterality: N/A;   KNEE ARTHROSCOPY WITH MEDIAL MENISECTOMY Right 10/07/2023   Procedure: ARTHROSCOPY, KNEE, WITH MEDIAL MENISCECTOMY;  Surgeon: Onesimo Oneil LABOR, MD;  Location: [facility];  Service: Orthopedics;  Laterality: Right;   ROBOTIC ASSISTED BILATERAL SALPINGO OOPHERECTOMY Bilateral 07/08/2023   Procedure: XI ROBOTIC ASSISTED BILATERAL SALPINGO OOPHORECTOMY, REMOVAL OF ADNEXAL MASS;  Surgeon: Verdon Keen, MD;  Location: [facility];  Service: Gynecology;  Laterality: Bilateral;   TUBAL LIGATION Bilateral 1995   Social History   Social History Narrative   ** Merged History Encounter **       Active smoker- lives in [place]; with mother- care giver; with quality/control- mortgage.    Immunization History  Administered Date(s) Administered   Tdap 10/17/2014     Objective: Vital Signs: LMP 10/04/2022 (Approximate) Comment: has period off/on   Physical Exam Vitals and nursing note reviewed.  Constitutional:      Appearance: She is well-developed.  HENT:     Head: Normocephalic and atraumatic.   Eyes:     Conjunctiva/sclera: Conjunctivae normal.     Cardiovascular:     Rate and Rhythm: Normal rate and regular rhythm.     Heart sounds: Normal heart sounds.  Pulmonary:     Effort: Pulmonary effort is normal.     Breath sounds: Normal breath sounds.  Abdominal:     General: Bowel sounds are normal.     Palpations: Abdomen is soft.   Musculoskeletal:     Cervical back: Normal range of motion.  Lymphadenopathy:     Cervical: No cervical adenopathy.   Skin:    General: Skin is warm and dry.     Capillary Refill: Capillary refill takes less than 2 seconds.   Neurological:     Mental Status: She is alert and oriented to person, place, and time.   Psychiatric:        Behavior: Behavior normal.      Musculoskeletal Exam: ***  CDAI Exam: CDAI Score: -- Patient Global: --; Provider Global: -- Swollen: --; Tender: -- Joint Exam 11/02/2023   No joint exam has been documented for this visit   There is currently no information  documented on the homunculus. Go to the Rheumatology activity and complete the homunculus joint exam.  Investigation: No additional findings.  Imaging: No results found.  Recent Labs: Lab Results  Component Value Date   WBC 7.3 08/18/2023   HGB 14.3 08/18/2023   PLT 175.0 08/18/2023   NA 140 08/18/2023   K 4.2 08/18/2023   CL 98 08/18/2023   CO2 34 (H) 08/18/2023   GLUCOSE 113 (H) 08/18/2023   BUN 14 08/18/2023   CREATININE 0.71 08/18/2023   BILITOT 0.5 08/18/2023   ALKPHOS 50 08/18/2023   AST 19 08/18/2023   ALT 17 08/18/2023   PROT 6.7 08/18/2023   ALBUMIN 4.3 08/18/2023   CALCIUM  9.2 08/18/2023    Speciality Comments: No specialty comments available.  Procedures:  No procedures performed Allergies: Buspar [buspirone], Lisinopril, Morphine, Norvasc [amlodipine], and Sulfa antibiotics   Assessment / Plan:     Visit Diagnoses: Primary osteoarthritis of both hands  Family history of rheumatoid arthritis-paternal grandmother  Chronic left shoulder pain  Other old tear of  meniscus of right knee, unspecified meniscus  Positive ANA (antinuclear antibody)  Raynaud's disease without gangrene  Other proteinuria  Essential hypertension  Dyslipidemia  History of colon polyps  Irritable bowel syndrome with diarrhea  Non-seasonal allergic rhinitis due to pollen  PTSD (post-traumatic stress disorder)  Anxiety and depression  Sleep disorder  History of suicidal ideation  History of alcohol abuse  History of cannabis abuse  Orders: No orders of the defined types were placed in this encounter.  No orders of the defined types were placed in this encounter.   Face-to-face time spent with patient was *** minutes. Greater than 50% of time was spent in counseling and coordination of care.  Follow-Up Instructions: No follow-ups on file.   Waddell CHRISTELLA Craze, PA-C  Note - This record has been created using Dragon software.  Chart creation errors have been sought, but may not always  have been located. Such creation errors do not reflect on  the standard of medical care.

## 2023-10-20 ENCOUNTER — Encounter: Payer: Self-pay | Admitting: Orthopedic Surgery

## 2023-10-20 ENCOUNTER — Ambulatory Visit (INDEPENDENT_AMBULATORY_CARE_PROVIDER_SITE_OTHER): Admitting: Orthopedic Surgery

## 2023-10-20 DIAGNOSIS — S83271D Complex tear of lateral meniscus, current injury, right knee, subsequent encounter: Secondary | ICD-10-CM

## 2023-10-20 DIAGNOSIS — S83241D Other tear of medial meniscus, current injury, right knee, subsequent encounter: Secondary | ICD-10-CM

## 2023-10-20 NOTE — Progress Notes (Signed)
 Orthopaedic Postop Note  Assessment: Sandra Montes is a 53 y.o. female s/p knee arthroscopy, with partial medial lateral meniscectomy and excision of a medial plica  DOS: 10/07/2023  Plan: Sutures removed, Steri-Strips placed Procedure reviewed She has good range of motion.  She is interested in physical therapy, referral provided Medicines as needed Ice machine as needed Continue to work on motion at home Follow-up in 1 month.   Follow-up: Return in about 4 weeks (around 11/17/2023). XR at next visit: None  Subjective:  Chief Complaint  Patient presents with   Routine Post Op    R knee DOS 10/07/23    History of Present Illness: Sandra Montes is a 53 y.o. female who presents following the above stated procedure.  Surgery was approximate 2 weeks ago.  She is doing well.  She continues to use the ice machine.  She is ambulating without assistance.  She has been working on her range of motion.  Her knee feels better.  She states she has less pain in the medial knee.  Review of Systems: No fevers or chills No numbness or tingling No Chest Pain No shortness of breath   Objective: LMP 10/04/2022 (Approximate) Comment: has period off/on  Physical Exam:  Alert and oriented.  No acute distress  Mild right-sided antalgic gait  Surgical incisions are healing.  No surrounding erythema or drainage.  Range of motion from 3-95 degrees.  No point tenderness.  Mild diffuse swelling.    IMAGING: I personally ordered and reviewed the following images:   No new imaging obtained today.   Tonita Frater, MD 10/20/2023 9:44 AM

## 2023-10-22 ENCOUNTER — Encounter: Payer: Self-pay | Admitting: Family

## 2023-10-22 DIAGNOSIS — Z1231 Encounter for screening mammogram for malignant neoplasm of breast: Secondary | ICD-10-CM

## 2023-10-27 NOTE — Addendum Note (Signed)
 Addended by: CORWIN ANTU on: 10/27/2023 05:32 PM   Modules accepted: Orders

## 2023-11-02 ENCOUNTER — Ambulatory Visit: Payer: 59 | Admitting: Physician Assistant

## 2023-11-02 DIAGNOSIS — G479 Sleep disorder, unspecified: Secondary | ICD-10-CM

## 2023-11-02 DIAGNOSIS — E785 Hyperlipidemia, unspecified: Secondary | ICD-10-CM

## 2023-11-02 DIAGNOSIS — J301 Allergic rhinitis due to pollen: Secondary | ICD-10-CM

## 2023-11-02 DIAGNOSIS — R808 Other proteinuria: Secondary | ICD-10-CM

## 2023-11-02 DIAGNOSIS — Z860100 Personal history of colon polyps, unspecified: Secondary | ICD-10-CM

## 2023-11-02 DIAGNOSIS — M23206 Derangement of unspecified meniscus due to old tear or injury, right knee: Secondary | ICD-10-CM

## 2023-11-02 DIAGNOSIS — Z8659 Personal history of other mental and behavioral disorders: Secondary | ICD-10-CM

## 2023-11-02 DIAGNOSIS — F4310 Post-traumatic stress disorder, unspecified: Secondary | ICD-10-CM

## 2023-11-02 DIAGNOSIS — F1211 Cannabis abuse, in remission: Secondary | ICD-10-CM

## 2023-11-02 DIAGNOSIS — I10 Essential (primary) hypertension: Secondary | ICD-10-CM

## 2023-11-02 DIAGNOSIS — Z8261 Family history of arthritis: Secondary | ICD-10-CM

## 2023-11-02 DIAGNOSIS — F1011 Alcohol abuse, in remission: Secondary | ICD-10-CM

## 2023-11-02 DIAGNOSIS — K580 Irritable bowel syndrome with diarrhea: Secondary | ICD-10-CM

## 2023-11-02 DIAGNOSIS — R768 Other specified abnormal immunological findings in serum: Secondary | ICD-10-CM

## 2023-11-02 DIAGNOSIS — M19041 Primary osteoarthritis, right hand: Secondary | ICD-10-CM

## 2023-11-02 DIAGNOSIS — I7300 Raynaud's syndrome without gangrene: Secondary | ICD-10-CM

## 2023-11-02 DIAGNOSIS — G8929 Other chronic pain: Secondary | ICD-10-CM

## 2023-11-02 DIAGNOSIS — F32A Depression, unspecified: Secondary | ICD-10-CM

## 2023-11-03 NOTE — Progress Notes (Unsigned)
 Office Visit Note  Patient: Sandra Montes             Date of Birth: [birth year]           MRN: [id number]             PCP: Corwin Antu, FNP Referring: Corwin Antu, FNP Visit Date: 11/17/2023 Occupation: @GUAROCC @  Subjective:  Pain in multiple joints   History of Present Illness: Sandra Montes is a 53 y.o. female with history of osteoarthritis and positive ANA.   Patient presents today with ongoing pain involving multiple joints.  She continues to have ongoing discomfort in the left shoulder, both hands, and the right knee joint.  Patient underwent a right medial menisectomy on 10/07/23 by Dr. Onesimo and had a postop visit today.  Patient continues to have ongoing pain and swelling.  She will be starting physical therapy on 11/26/2023.  She has been taking ibuprofen  as needed for symptomatic relief.  She has not noticed any joint swelling in her hands but does have some soreness and stiffness bilaterally. Patient states her primary concern remains the weight gain she has noticed in September 2024.  Patient states that prior to the weight gain she was started on Depakote  and cymbalta .  Patient states that earlier this spring she was also had an ovarian cyst requiring bilateral ovary removal. She requested to have updated lab work today due to these changes since her last office visit.   Activities of Daily Living:  Patient reports morning stiffness for 30 minutes.   Patient Denies nocturnal pain.  Difficulty dressing/grooming: Reports Difficulty climbing stairs: Reports Difficulty getting out of chair: Reports Difficulty using hands for taps, buttons, cutlery, and/or writing: Denies  Review of Systems  Constitutional:  Negative for fatigue.  HENT:  Positive for mouth dryness. Negative for mouth sores.   Eyes:  Positive for dryness.  Respiratory:  Positive for shortness of breath.   Cardiovascular:  Positive for chest pain and palpitations.  Gastrointestinal:  Negative  for blood in stool, constipation and diarrhea.  Endocrine: Negative for increased urination.  Genitourinary:  Negative for involuntary urination.  Musculoskeletal:  Positive for joint pain, joint pain, joint swelling, myalgias, muscle weakness, morning stiffness, muscle tenderness and myalgias. Negative for gait problem.  Skin:  Positive for sensitivity to sunlight. Negative for color change, rash and hair loss.  Allergic/Immunologic: Negative for susceptible to infections.  Neurological:  Negative for dizziness and headaches.  Hematological:  Negative for swollen glands.  Psychiatric/Behavioral:  Positive for depressed mood. Negative for sleep disturbance. The patient is nervous/anxious.     PMFS History:  Patient Active Problem List   Diagnosis Date Noted   Prediabetes 08/18/2023   History of tobacco abuse 08/18/2023   Proteinuria 06/08/2023   Tear of lateral collateral ligament of right knee 05/17/2023   Acute meniscal tear of knee, right, sequela 05/17/2023   Electrocardiogram showing T wave abnormalities 05/17/2023   DOE (dyspnea on exertion) 05/17/2023   Abnormal weight gain 05/17/2023   Pre-op evaluation 05/17/2023   PTSD (post-traumatic stress disorder) 03/24/2023   High risk medication use 03/24/2023   History of alcohol abuse 03/24/2023   History of cannabis abuse 03/24/2023   Cessation of tobacco use in previous 12 months 02/11/2023   History of suicidal ideation 01/28/2023   Sleep disorder 12/30/2022   MDD (major depressive disorder), recurrent episode, severe (HCC) 12/17/2022   Obsessional thoughts 10/20/2022   Family history of cardiac arrest 09/07/2022   Erythrocytosis 01/30/2022  Elevated hemoglobin (HCC) 01/13/2022   History of colon polyps 01/13/2022   Hot flushes, perimenopausal 01/13/2022   Anxiety state 01/13/2022   Irritable bowel syndrome with diarrhea 10/13/2021   Centrilobular emphysema (HCC) 07/04/2021   Non-seasonal allergic rhinitis due to pollen  07/04/2021   Dyslipidemia 12/29/2018   Essential hypertension 12/29/2018    Past Medical History:  Diagnosis Date   Adnexal mass 05/17/2022   a.) CT A/P 05/18/2023: 17.0 x 16.3 x 12.8 cm --> favored to be a large benign fluid filled ovarian cyst --> surgical resection recommended/pending   Anxiety    Aortic atherosclerosis (HCC)    Bartholin's gland abscess    CAD (coronary artery disease) 06/04/2023   a.) cCTA 06/04/2023: Ca2+ = 450 (99th %'ile) --> 27.6 LAD, 66.1 LCx, 356 RCA   Chronic left shoulder pain    Depression    Diastolic dysfunction 05/26/2023   a.) TTE 05/26/2023: EF 55-60%, no RWMAs, G1DD, triv AR/MR, mild AoC sclerosis without stenosis   DOE (dyspnea on exertion)    Emphysema lung (HCC)    History of 2019 novel coronavirus disease (COVID-19) 12/13/2020   History of alcohol abuse    History of allergy     History of cannabis abuse    History of suicidal ideation 09/14/2022   a.) scared to do it; increasing intrusive thoughts about jumping off a cliff because she is tired of fighting; (+) crying, anxiety, depression, inturrupted sleep, appetite changes related to relationship problems (broke up with BF) and what sounds like caregiver role strain (caregiver to very demanding mother); (+) polysubstance use/abuse   HLD (hyperlipidemia)    Hyperlipidemia    Hypertension    Insomnia    a.) uses trazodone  PRN   Irritable bowel syndrome with diarrhea    Nephrolithiasis    Pain in both hands    Polyarthralgia    Positive ANA (antinuclear antibody)    PTSD (post-traumatic stress disorder)    RBBB (right bundle branch block)    Sinus bradycardia    Urinary incontinence    Weight gain due to medication    a.) significant (40lb) weight gain with the initiation of divalproex     Family History  Problem Relation Age of Onset   Depression Mother    Alcohol abuse Mother    Heart attack Mother 83   Skin cancer Father    Alcohol abuse Father    Prostate cancer Father     Rheum arthritis Paternal Grandmother    Prostate cancer Paternal Grandfather    Skin cancer Paternal Grandfather    Asthma Daughter    Mental illness Daughter        borderline   Mitral valve prolapse Daughter        & enlarged aorta   Migraines Daughter    Alcohol abuse Cousin    Healthy Son    Healthy Son    Past Surgical History:  Procedure Laterality Date   ABDOMINAL HYSTERECTOMY  07/08/2023   CESAREAN SECTION N/A 1993   CHOLECYSTECTOMY N/A 1996   COLONOSCOPY WITH PROPOFOL  N/A 11/18/2021   Procedure: COLONOSCOPY WITH PROPOFOL ;  Surgeon: Unk Corinn Skiff, MD;  Location: [facility];  Service: Gastroenterology;  Laterality: N/A;   KNEE ARTHROSCOPY WITH MEDIAL MENISECTOMY Right 10/07/2023   Procedure: ARTHROSCOPY, KNEE, WITH MEDIAL MENISCECTOMY;  Surgeon: Onesimo Oneil LABOR, MD;  Location: [facility];  Service: Orthopedics;  Laterality: Right;   ROBOTIC ASSISTED BILATERAL SALPINGO OOPHERECTOMY Bilateral 07/08/2023   Procedure: XI ROBOTIC ASSISTED BILATERAL SALPINGO OOPHORECTOMY, REMOVAL OF  ADNEXAL MASS;  Surgeon: Verdon Keen, MD;  Location: [facility];  Service: Gynecology;  Laterality: Bilateral;   TUBAL LIGATION Bilateral 1995   Social History   Social History Narrative   ** Merged History Encounter **       Active smoker- lives in [place]; with mother- care giver; with quality/control- mortgage.    Immunization History  Administered Date(s) Administered   Tdap 10/17/2014     Objective: Vital Signs: BP 113/76 (BP Location: Left Arm, Patient Position: Sitting, Cuff Size: Normal)   Pulse 60   Resp 16   Ht 5' 7 (1.702 m)   Wt 253 lb 12.8 oz (115.1 kg)   LMP 10/04/2022 (Approximate) Comment: has period off/on  BMI 39.75 kg/m    Physical Exam Vitals and nursing note reviewed.  Constitutional:      Appearance: She is well-developed.  HENT:     Head: Normocephalic and atraumatic.  Eyes:     Conjunctiva/sclera: Conjunctivae normal.  Cardiovascular:     Rate  and Rhythm: Normal rate and regular rhythm.     Heart sounds: Normal heart sounds.  Pulmonary:     Effort: Pulmonary effort is normal.     Breath sounds: Normal breath sounds.  Abdominal:     General: Bowel sounds are normal.     Palpations: Abdomen is soft.  Musculoskeletal:     Cervical back: Normal range of motion.  Lymphadenopathy:     Cervical: No cervical adenopathy.  Skin:    General: Skin is warm and dry.     Capillary Refill: Capillary refill takes less than 2 seconds.  Neurological:     Mental Status: She is alert and oriented to person, place, and time.  Psychiatric:        Behavior: Behavior normal.      Musculoskeletal Exam: C-spine has discomfort with ROM.  Discomfort with ROM of the left shoulder with crepitus.  Right shoulder has good ROM.  Elbow joints, wrist joints, Mcps, PIPs, and DIPs good ROM with no synovitis.  Complete fist formation bilaterally.  Hip joints have good ROM with no groin pain.  Swelling and warmth of the right knee.  Left knee has no warmth or effusion.  Ankle joints have good ROM with no tenderness or joint swelling.   CDAI Exam: CDAI Score: -- Patient Global: --; Provider Global: -- Swollen: --; Tender: -- Joint Exam 11/17/2023   No joint exam has been documented for this visit   There is currently no information documented on the homunculus. Go to the Rheumatology activity and complete the homunculus joint exam.  Investigation: No additional findings.  Imaging: No results found.  Recent Labs: Lab Results  Component Value Date   WBC 7.3 08/18/2023   HGB 14.3 08/18/2023   PLT 175.0 08/18/2023   NA 140 08/18/2023   K 4.2 08/18/2023   CL 98 08/18/2023   CO2 34 (H) 08/18/2023   GLUCOSE 113 (H) 08/18/2023   BUN 14 08/18/2023   CREATININE 0.71 08/18/2023   BILITOT 0.5 08/18/2023   ALKPHOS 50 08/18/2023   AST 19 08/18/2023   ALT 17 08/18/2023   PROT 6.7 08/18/2023   ALBUMIN 4.3 08/18/2023   CALCIUM  9.2 08/18/2023     Speciality Comments: No specialty comments available.  Procedures:  No procedures performed Allergies: Buspar [buspirone], Lisinopril, Morphine, Norvasc [amlodipine], and Sulfa antibiotics    Assessment / Plan:     Visit Diagnoses: Chronic left shoulder pain: X-rays of the left shoulder were consistent with early  acromioclavicular degenerative changes.  She continues to experience intermittent pain and stiffness involving the left shoulder.  Discussed the list of natural anti-inflammatories which she can start taking.  Patient plans on reaching out to her cardiologist to make sure she can take turmeric.  Primary osteoarthritis of both hands - Clinical and radiographic findings were suggestive of osteoarthritis.  Patient continues to experience pain and stiffness involving both hands.  She has tenderness over the right third MCP joint and the left fourth MCP joint.  No synovitis was noted.  Discussed the option of scheduling an ultrasound to assess for synovitis but she would like to hold off at this time.  Plan to check sed rate today. Discussed the list of natural anti-inflammatories which she can try taking.  S/P arthroscopic surgery of right knee: History of right knee pain since 2021.  History of large effusion requiring aspiration.  Followed at [facility].  MRI showed meniscal tear. Right medial meniscectomy, 10/07/23 performed by Dr. Onesimo.  Plan to proceed with PT on 11/26/23.   Positive ANA (antinuclear antibody) - ANA low titer positive 1: 320 nucleolar, ENA negative.  Complements normal.no clinical features of systemic lupus at this time. She continues to experience arthralgias and joint stiffness.  Offered to schedule an ultrasound to assess for inflammation. Plan to obtain the following lab work today for further evaluation.    - Plan: TSH, T4, free, Anti-Smith antibody, Sjogrens syndrome-A extractable nuclear antibody, Sjogrens syndrome-B extractable nuclear antibody, Anti-DNA  antibody, double-stranded, Protein / creatinine ratio, urine, C3 and C4, Sedimentation rate, Comprehensive metabolic panel with GFR, CBC with Differential/Platelet, ANA  Raynaud's disease without gangrene: SCL 70 negative.  Capillary refill 2 to 3 seconds.  No signs of sclerodactyly noted.  Weight gain -Since September 2024.  Started Depakote  and Cymbalta  prior to noticing significant weight gain.  Plan to check the following lab work today.  Plan: TSH, T4, free  Other proteinuria: Urine protein creatinine ratio updated today.  Other medical conditions are listed as follows:  Essential hypertension: Blood pressure was 113/76 today in the office.  Dyslipidemia  History of colon polyps  Irritable bowel syndrome with diarrhea  Non-seasonal allergic rhinitis due to pollen  PTSD (post-traumatic stress disorder)  Anxiety and depression  Sleep disorder  History of alcohol abuse  History of cannabis abuse  History of suicidal ideation  Former smoker: She quit smoking in June 2024.     Orders: Orders Placed This Encounter  Procedures   TSH   T4, free   Anti-Smith antibody   Sjogrens syndrome-A extractable nuclear antibody   Sjogrens syndrome-B extractable nuclear antibody   Anti-DNA antibody, double-stranded   Protein / creatinine ratio, urine   C3 and C4   Sedimentation rate   Comprehensive metabolic panel with GFR   CBC with Differential/Platelet   ANA   No orders of the defined types were placed in this encounter.    Follow-Up Instructions: Return in about 6 months (around 05/19/2024) for +ANA , Osteoarthritis.   Waddell CHRISTELLA Craze, PA-C  Note - This record has been created using Dragon software.  Chart creation errors have been sought, but may not always  have been located. Such creation errors do not reflect on  the standard of medical care.

## 2023-11-17 ENCOUNTER — Ambulatory Visit: Admitting: Orthopedic Surgery

## 2023-11-17 ENCOUNTER — Encounter: Payer: Self-pay | Admitting: Physician Assistant

## 2023-11-17 ENCOUNTER — Encounter: Payer: Self-pay | Admitting: Orthopedic Surgery

## 2023-11-17 ENCOUNTER — Ambulatory Visit: Payer: Self-pay | Attending: Physician Assistant | Admitting: Physician Assistant

## 2023-11-17 ENCOUNTER — Other Ambulatory Visit: Payer: Self-pay | Admitting: Acute Care

## 2023-11-17 VITALS — BP 113/76 | HR 60 | Resp 16 | Ht 67.0 in | Wt 253.8 lb

## 2023-11-17 DIAGNOSIS — F32A Depression, unspecified: Secondary | ICD-10-CM

## 2023-11-17 DIAGNOSIS — R768 Other specified abnormal immunological findings in serum: Secondary | ICD-10-CM | POA: Diagnosis not present

## 2023-11-17 DIAGNOSIS — G8929 Other chronic pain: Secondary | ICD-10-CM

## 2023-11-17 DIAGNOSIS — R635 Abnormal weight gain: Secondary | ICD-10-CM

## 2023-11-17 DIAGNOSIS — M19042 Primary osteoarthritis, left hand: Secondary | ICD-10-CM

## 2023-11-17 DIAGNOSIS — M23206 Derangement of unspecified meniscus due to old tear or injury, right knee: Secondary | ICD-10-CM

## 2023-11-17 DIAGNOSIS — F1211 Cannabis abuse, in remission: Secondary | ICD-10-CM

## 2023-11-17 DIAGNOSIS — R808 Other proteinuria: Secondary | ICD-10-CM

## 2023-11-17 DIAGNOSIS — F17200 Nicotine dependence, unspecified, uncomplicated: Secondary | ICD-10-CM

## 2023-11-17 DIAGNOSIS — M25512 Pain in left shoulder: Secondary | ICD-10-CM | POA: Diagnosis not present

## 2023-11-17 DIAGNOSIS — J301 Allergic rhinitis due to pollen: Secondary | ICD-10-CM

## 2023-11-17 DIAGNOSIS — Z87891 Personal history of nicotine dependence: Secondary | ICD-10-CM

## 2023-11-17 DIAGNOSIS — G479 Sleep disorder, unspecified: Secondary | ICD-10-CM

## 2023-11-17 DIAGNOSIS — Z8659 Personal history of other mental and behavioral disorders: Secondary | ICD-10-CM

## 2023-11-17 DIAGNOSIS — F4310 Post-traumatic stress disorder, unspecified: Secondary | ICD-10-CM

## 2023-11-17 DIAGNOSIS — M19041 Primary osteoarthritis, right hand: Secondary | ICD-10-CM

## 2023-11-17 DIAGNOSIS — E785 Hyperlipidemia, unspecified: Secondary | ICD-10-CM

## 2023-11-17 DIAGNOSIS — Z9889 Other specified postprocedural states: Secondary | ICD-10-CM

## 2023-11-17 DIAGNOSIS — K580 Irritable bowel syndrome with diarrhea: Secondary | ICD-10-CM

## 2023-11-17 DIAGNOSIS — F419 Anxiety disorder, unspecified: Secondary | ICD-10-CM

## 2023-11-17 DIAGNOSIS — Z860100 Personal history of colon polyps, unspecified: Secondary | ICD-10-CM

## 2023-11-17 DIAGNOSIS — F1011 Alcohol abuse, in remission: Secondary | ICD-10-CM

## 2023-11-17 DIAGNOSIS — S83271D Complex tear of lateral meniscus, current injury, right knee, subsequent encounter: Secondary | ICD-10-CM

## 2023-11-17 DIAGNOSIS — I7300 Raynaud's syndrome without gangrene: Secondary | ICD-10-CM

## 2023-11-17 DIAGNOSIS — I10 Essential (primary) hypertension: Secondary | ICD-10-CM

## 2023-11-17 NOTE — Progress Notes (Signed)
 Orthopaedic Postop Note  Assessment: Sandra Montes is a 53 y.o. female s/p knee arthroscopy, with partial medial lateral meniscectomy and excision of a medial plica  DOS: 10/07/2023  Plan: Continues to have some pain and swelling in the right knee.  Unsure what is happening, but referral to physical therapy was not completed.  We reviewed the system, and a referral was sent.  We have sent a referral again.  I have asked Sandra Montes to contact the clinic if she has not heard from anybody within the next 1-2 weeks.  She states understanding.  Otherwise, continue to work on range of motion and strength at home.  I have provided exercises for her to initiate.  She states understanding.  She will follow-up in 6 weeks.   Follow-up: Return in about 6 weeks (around 12/29/2023). XR at next visit: None  Subjective:  Chief Complaint  Patient presents with   Routine Post Op    R KA DOS: 10/07/2023. Having a lot of stiffness and swelling.     History of Present Illness: Sandra Montes is a 53 y.o. female who presents following the above stated procedure.  Surgery was approximate 6 weeks ago.  She continues to have swelling and stiffness of the right knee.  She is having some pain.  She is continuing with her regular activities.  She has not heard from physical therapy.  She has been trying to do some exercises on her own.  She is not taking medicines on a consistent basis.  Review of Systems: No fevers or chills No numbness or tingling No Chest Pain No shortness of breath   Objective: LMP 10/04/2022 (Approximate) Comment: has period off/on  Physical Exam:  Alert and oriented.  No acute distress  Mild right-sided antalgic gait  Surgical incisions are healed.  No surrounding erythema or drainage.  Range of motion from 0-110 degrees.  Tenderness palpation over the medial joint line.  Mild diffuse swelling.   IMAGING: I personally ordered and reviewed the following  images:   No new imaging obtained today.   Oneil DELENA Horde, MD 11/17/2023 8:49 AM

## 2023-11-17 NOTE — Patient Instructions (Signed)

## 2023-11-18 ENCOUNTER — Ambulatory Visit: Payer: Self-pay | Admitting: Physician Assistant

## 2023-11-18 NOTE — Progress Notes (Signed)
 Urine protein creatinine ratio remains slightly elevated.  Please clarify if she has seen nephrology? Glucose is 113.  Rest of CMP stable.  CBC WNL ESR WNL TSH WNL Free T4 WNL

## 2023-11-19 LAB — COMPREHENSIVE METABOLIC PANEL WITH GFR
AG Ratio: 1.4 (calc) (ref 1.0–2.5)
ALT: 16 U/L (ref 6–29)
AST: 18 U/L (ref 10–35)
Albumin: 4.1 g/dL (ref 3.6–5.1)
Alkaline phosphatase (APISO): 52 U/L (ref 37–153)
BUN: 15 mg/dL (ref 7–25)
CO2: 32 mmol/L (ref 20–32)
Calcium: 9.2 mg/dL (ref 8.6–10.4)
Chloride: 97 mmol/L — ABNORMAL LOW (ref 98–110)
Creat: 0.5 mg/dL (ref 0.50–1.03)
Globulin: 2.9 g/dL (ref 1.9–3.7)
Glucose, Bld: 113 mg/dL — ABNORMAL HIGH (ref 65–99)
Potassium: 3.5 mmol/L (ref 3.5–5.3)
Sodium: 140 mmol/L (ref 135–146)
Total Bilirubin: 0.4 mg/dL (ref 0.2–1.2)
Total Protein: 7 g/dL (ref 6.1–8.1)
eGFR: 112 mL/min/1.73m2 (ref >=60)

## 2023-11-19 LAB — SEDIMENTATION RATE: Sed Rate: 9 mm/h (ref 0–30)

## 2023-11-19 LAB — CBC WITH DIFFERENTIAL/PLATELET
Absolute Lymphocytes: 2736 {cells}/uL (ref 850–3900)
Absolute Monocytes: 730 {cells}/uL (ref 200–950)
Basophils Absolute: 61 {cells}/uL (ref 0–200)
Basophils Relative: 0.8 %
Eosinophils Absolute: 372 {cells}/uL (ref 15–500)
Eosinophils Relative: 4.9 %
HCT: 41.5 % (ref 35.0–45.0)
Hemoglobin: 13.9 g/dL (ref 11.7–15.5)
MCH: 30.2 pg (ref 27.0–33.0)
MCHC: 33.5 g/dL (ref 32.0–36.0)
MCV: 90.2 fL (ref 80.0–100.0)
MPV: 8 fL (ref 7.5–12.5)
Monocytes Relative: 9.6 %
Neutro Abs: 3701 {cells}/uL (ref 1500–7800)
Neutrophils Relative %: 48.7 %
Platelets: 151 Thousand/uL (ref 140–400)
RBC: 4.6 Million/uL (ref 3.80–5.10)
RDW: 12.7 % (ref 11.0–15.0)
Total Lymphocyte: 36 %
WBC: 7.6 Thousand/uL (ref 3.8–10.8)

## 2023-11-19 LAB — PROTEIN / CREATININE RATIO, URINE
Creatinine, Urine: 26 mg/dL (ref 20–275)
Protein/Creat Ratio: 269 mg/g{creat} — ABNORMAL HIGH (ref 24–184)
Protein/Creatinine Ratio: 0.269 mg/mg{creat} — ABNORMAL HIGH (ref 0.024–0.184)
Total Protein, Urine: 7 mg/dL (ref 5–24)

## 2023-11-19 LAB — TSH: TSH: 2.52 m[IU]/L

## 2023-11-19 LAB — SJOGRENS SYNDROME-B EXTRACTABLE NUCLEAR ANTIBODY: SSB (La) (ENA) Antibody, IgG: <1 AI

## 2023-11-19 LAB — ANTI-SMITH ANTIBODY: ENA SM Ab Ser-aCnc: <1 AI

## 2023-11-19 LAB — ANTI-DNA ANTIBODY, DOUBLE-STRANDED: ds DNA Ab: 1 [IU]/mL

## 2023-11-19 LAB — ANTI-NUCLEAR AB-TITER (ANA TITER)
ANA TITER: 1:320 {titer} — ABNORMAL HIGH
ANA Titer 1: 1:40 {titer} — ABNORMAL HIGH

## 2023-11-19 LAB — C3 AND C4
C3 Complement: 193 mg/dL (ref 83–193)
C4 Complement: 35 mg/dL (ref 15–57)

## 2023-11-19 LAB — T4, FREE: Free T4: 1.1 ng/dL (ref 0.8–1.8)

## 2023-11-19 LAB — SJOGRENS SYNDROME-A EXTRACTABLE NUCLEAR ANTIBODY: SSA (Ro) (ENA) Antibody, IgG: <1 AI

## 2023-11-19 LAB — ANA: Anti Nuclear Antibody (ANA): POSITIVE — AB

## 2023-11-19 NOTE — Telephone Encounter (Signed)
 LMOM for patient to call the office.

## 2023-11-19 NOTE — Progress Notes (Signed)
 Smith antibody negative. dsDNA negative

## 2023-11-22 NOTE — Progress Notes (Signed)
 ANA remains positive.   Ro and La antibodies negative.  Complements WNL.

## 2023-12-02 ENCOUNTER — Ambulatory Visit (INDEPENDENT_AMBULATORY_CARE_PROVIDER_SITE_OTHER): Admitting: Psychiatry

## 2023-12-02 ENCOUNTER — Encounter: Payer: Self-pay | Admitting: Psychiatry

## 2023-12-02 VITALS — BP 128/84 | HR 64 | Temp 98.3°F | Ht 67.0 in | Wt 253.8 lb

## 2023-12-02 DIAGNOSIS — F4310 Post-traumatic stress disorder, unspecified: Secondary | ICD-10-CM | POA: Diagnosis not present

## 2023-12-02 DIAGNOSIS — F3342 Major depressive disorder, recurrent, in full remission: Secondary | ICD-10-CM

## 2023-12-02 DIAGNOSIS — F1211 Cannabis abuse, in remission: Secondary | ICD-10-CM | POA: Diagnosis not present

## 2023-12-02 DIAGNOSIS — F1011 Alcohol abuse, in remission: Secondary | ICD-10-CM

## 2023-12-02 MED ORDER — HYDROXYZINE HCL 25 MG PO TABS: 25.0000 mg | ORAL_TABLET | Freq: Three times a day (TID) | ORAL | 0 refills | Status: AC | PRN

## 2023-12-02 MED ORDER — DIVALPROEX SODIUM ER 250 MG PO TB24: 250.0000 mg | ORAL_TABLET | ORAL | Status: DC

## 2023-12-02 NOTE — Progress Notes (Unsigned)
 [facility] MD OP Progress Note  12/02/2023 10:52 AM Sandra Montes  MRN:  [id number]  Chief Complaint:  Chief Complaint  Patient presents with   Follow-up   Depression   Anxiety   Medication Refill   Medication Problem   Discussed the use of AI scribe software for clinical note transcription with the patient, who gave verbal consent to proceed.  History of Present Illness Sandra Montes is a 53 year old Caucasian female, divorced, currently employed, lives in [place], has a history of MDD, PTSD, history of alcohol and cannabis abuse in remission, emphysema, hypertension, asthma, osteoarthritis was evaluated in office today for a follow-up appointment.  She describes her mood as stable and reports feeling mentally well. She notes the absence of racing thoughts and states she can fall asleep without replaying her entire life in her head. She expresses satisfaction with her current mental state and emphasizes the importance of maintaining this stability. She denies any thoughts about hurting herself. She continues to attend weekly therapy sessions and expresses commitment to ongoing therapy.  Her current medications include Depakote  250 mg and 500 mg in the evening, duloxetine  20 mg twice daily, trazodone , and hydroxyzine  as needed for anxiety. She reports significant weight gain since starting these medications, noting an increase of over 50 pounds in the past year. She identifies multiple contributing factors to the weight gain, including medication side effects, reduced mobility following knee surgery, menopause after ovarian removal, and smoking cessation. She expresses concern about the impact of weight gain on her physical health and daily functioning, and she notes associated increases in blood sugar and the development of stage 1 chronic kidney disease. She describes efforts to manage her weight through nutrition counseling and support from her significant other, and she expresses a  strong desire to improve her physical health now that her mental health is stable.  She reports quitting smoking after 39 years of tobacco use.  She lives with her significant other. She participates in a nutritionist-based program ([employer]). She plans to obtain a motorcycle license next month and will use a three-wheeler.     Visit Diagnosis:    ICD-10-CM   1. Recurrent major depressive disorder, in full remission (HCC)  F33.42 hydrOXYzine  (ATARAX ) 25 MG tablet    divalproex  (DEPAKOTE  ER) 250 MG 24 hr tablet    2. PTSD (post-traumatic stress disorder)  F43.10 hydrOXYzine  (ATARAX ) 25 MG tablet    3. History of alcohol abuse  F10.11     4. History of cannabis abuse  F12.11       Past Psychiatric History: I have reviewed past psychiatric history from progress note on 03/24/2023.  Past trials of medications like Pristiq, Wellbutrin, Paxil, Prozac.  Past Medical History:  Past Medical History:  Diagnosis Date   Adnexal mass 05/17/2022   a.) CT A/P 05/18/2023: 17.0 x 16.3 x 12.8 cm --> favored to be a large benign fluid filled ovarian cyst --> surgical resection recommended/pending   Anxiety    Aortic atherosclerosis (HCC)    Bartholin's gland abscess    CAD (coronary artery disease) 06/04/2023   a.) cCTA 06/04/2023: Ca2+ = 450 (99th %'ile) --> 27.6 LAD, 66.1 LCx, 356 RCA   Chronic left shoulder pain    Depression    Diastolic dysfunction 05/26/2023   a.) TTE 05/26/2023: EF 55-60%, no RWMAs, G1DD, triv AR/MR, mild AoC sclerosis without stenosis   DOE (dyspnea on exertion)    Emphysema lung (HCC)    History of 2019 novel coronavirus disease (  COVID-19) 12/13/2020   History of alcohol abuse    History of allergy     History of cannabis abuse    History of suicidal ideation 09/14/2022   a.) scared to do it; increasing intrusive thoughts about jumping off a cliff because she is tired of fighting; (+) crying, anxiety, depression, inturrupted sleep, appetite changes related to  relationship problems (broke up with BF) and what sounds like caregiver role strain (caregiver to very demanding mother); (+) polysubstance use/abuse   HLD (hyperlipidemia)    Hyperlipidemia    Hypertension    Insomnia    a.) uses trazodone  PRN   Irritable bowel syndrome with diarrhea    Nephrolithiasis    Pain in both hands    Polyarthralgia    Positive ANA (antinuclear antibody)    PTSD (post-traumatic stress disorder)    RBBB (right bundle branch block)    Sinus bradycardia    Urinary incontinence    Weight gain due to medication    a.) significant (40lb) weight gain with the initiation of divalproex     Past Surgical History:  Procedure Laterality Date   ABDOMINAL HYSTERECTOMY  07/08/2023   CESAREAN SECTION N/A 1993   CHOLECYSTECTOMY N/A 1996   COLONOSCOPY WITH PROPOFOL  N/A 11/18/2021   Procedure: COLONOSCOPY WITH PROPOFOL ;  Surgeon: Unk Corinn Skiff, MD;  Location: [facility];  Service: Gastroenterology;  Laterality: N/A;   KNEE ARTHROSCOPY WITH MEDIAL MENISECTOMY Right 10/07/2023   Procedure: ARTHROSCOPY, KNEE, WITH MEDIAL MENISCECTOMY;  Surgeon: Onesimo Oneil LABOR, MD;  Location: [facility];  Service: Orthopedics;  Laterality: Right;   ROBOTIC ASSISTED BILATERAL SALPINGO OOPHERECTOMY Bilateral 07/08/2023   Procedure: XI ROBOTIC ASSISTED BILATERAL SALPINGO OOPHORECTOMY, REMOVAL OF ADNEXAL MASS;  Surgeon: Verdon Keen, MD;  Location: [facility];  Service: Gynecology;  Laterality: Bilateral;   TUBAL LIGATION Bilateral 1995    Family Psychiatric History: I have reviewed family psychiatric history from progress note on 03/24/2023.  Family History:  Family History  Problem Relation Age of Onset   Depression Mother    Alcohol abuse Mother    Heart attack Mother 91   Skin cancer Father    Alcohol abuse Father    Prostate cancer Father    Rheum arthritis Paternal Grandmother    Prostate cancer Paternal Grandfather    Skin cancer Paternal Grandfather    Asthma Daughter     Mental illness Daughter        borderline   Mitral valve prolapse Daughter        & enlarged aorta   Migraines Daughter    Alcohol abuse Cousin    Healthy Son    Healthy Son     Social History: I have reviewed social history from progress note on 03/24/2023. Social History   Socioeconomic History   Marital status: Divorced    Spouse name: Not on file   Number of children: 3   Years of education: 12   Highest education level: Associate degree: occupational, Scientist, product/process development, or vocational program  Occupational History    Employer: [employer]    Comment: mortgage insurance  Tobacco Use   Smoking status: Former    Current packs/day: 0.00    Average packs/day: 1.7 packs/day for 57.0 years (95.5 ttl pk-yrs)    Types: Cigarettes    Start date: 2004    Quit date: 2024    Years since quitting: 1.5    Passive exposure: Current   Smokeless tobacco: Never   Tobacco comments:    Pt smokes two packs cigarettes  for the past 20 yrs, wants 14 mg patch  Vaping Use   Vaping status: Never Used  Substance and Sexual Activity   Alcohol use: Not Currently    Comment: h/o alcoholism quit 2017   Drug use: Yes    Types: Marijuana    Comment: occ   Sexual activity: Not Currently    Partners: Male    Birth control/protection: None  Other Topics Concern   Not on file  Social History Narrative   ** Merged History Encounter **       Active smoker- lives in [place]; with mother- care giver; with quality/control- mortgage.    Social Drivers of Health   Financial Resource Strain: Medium Risk (05/14/2023)   Overall Financial Resource Strain (CARDIA)    Difficulty of Paying Living Expenses: Somewhat hard  Food Insecurity: No Food Insecurity (05/14/2023)   Hunger Vital Sign    Worried About Running Out of Food in the Last Year: Never true    Ran Out of Food in the Last Year: Never true  Transportation Needs: No Transportation Needs (05/14/2023)   PRAPARE - Scientist, research (physical sciences) (Medical): No    Lack of Transportation (Non-Medical): No  Physical Activity: Unknown (05/14/2023)   Exercise Vital Sign    Days of Exercise per Week: 0 days    Minutes of Exercise per Session: Not on file  Stress: No Stress Concern Present (05/14/2023)   [employer] of Occupational Health - Occupational Stress Questionnaire    Feeling of Stress : Only a little  Social Connections: Socially Isolated (05/14/2023)   Social Connection and Isolation Panel    Frequency of Communication with Friends and Family: More than three times a week    Frequency of Social Gatherings with Friends and Family: Once a week    Attends Religious Services: Never    Database administrator or Organizations: No    Attends Engineer, structural: Not on file    Marital Status: Divorced    Allergies:  Allergies  Allergen Reactions   Buspar [Buspirone] Other (See Comments)    Optical migraines   Lisinopril Cough   Morphine Itching and Other (See Comments)    tried to take her face off due to itching   Norvasc [Amlodipine] Swelling   Sulfa Antibiotics Other (See Comments)    Passes out    Metabolic Disorder Labs: Lab Results  Component Value Date   HGBA1C 6.1 08/18/2023   MPG 111 07/04/2021   Lab Results  Component Value Date   PROLACTIN 4.3 01/13/2022   Lab Results  Component Value Date   CHOL 143 10/20/2022   TRIG 61.0 10/20/2022   HDL 47.40 10/20/2022   CHOLHDL 3 10/20/2022   VLDL 12.2 10/20/2022   LDLCALC 84 10/20/2022   LDLCALC 69 01/13/2022   Lab Results  Component Value Date   TSH 2.52 11/17/2023   TSH 4.19 08/18/2023    Therapeutic Level Labs: No results found for: LITHIUM Lab Results  Component Value Date   VALPROATE 54.0 05/17/2023   VALPROATE 64.6 01/01/2023   No results found for: CBMZ  Current Medications: Current Outpatient Medications  Medication Sig Dispense Refill   acetaminophen  (TYLENOL ) 650 MG CR tablet Take 650 mg by mouth  every 8 (eight) hours as needed for pain.     albuterol  (VENTOLIN  HFA) 108 (90 Base) MCG/ACT inhaler INHALE 2 PUFFS INTO THE LUNGS EVERY 4 HOURS AS NEEDED FOR WHEEZING OR SHORTNESS OF BREATH 8.5 g  0   atorvastatin  (LIPITOR) 40 MG tablet Take 1 tablet (40 mg total) by mouth daily. 90 tablet 3   B Complex-C (B-COMPLEX WITH VITAMIN C) tablet Take 1 tablet by mouth in the morning.     Calcium  Citrate-Vitamin D (CITRACAL PETITES/VITAMIN D PO) Take 1 tablet by mouth in the morning and at bedtime.     cetirizine (ZYRTEC) 10 MG tablet Take 10 mg by mouth daily.     DULoxetine  (CYMBALTA ) 20 MG capsule Take 1 capsule (20 mg total) by mouth 2 (two) times daily. 180 capsule 1   ezetimibe  (ZETIA ) 10 MG tablet Take 1 tablet (10 mg total) by mouth daily. 90 tablet 3   hydrochlorothiazide  (HYDRODIURIL ) 25 MG tablet Take 1 tablet (25 mg total) by mouth daily. 90 tablet 3   irbesartan  (AVAPRO ) 300 MG tablet Take 1 tablet (300 mg total) by mouth daily. 90 tablet 3   metoprolol  succinate (TOPROL -XL) 50 MG 24 hr tablet Take 1 tablet (50 mg total) by mouth daily. Take with or immediately following a meal. 90 tablet 3   Omega-3 1400 MG CAPS Take 2,800 mg by mouth daily.     potassium chloride  (KLOR-CON ) 10 MEQ tablet Take 1 tablet (10 mEq total) by mouth 2 (two) times daily. 180 tablet 3   traZODone  (DESYREL ) 50 MG tablet Take 1 tablet (50 mg total) by mouth at bedtime as needed for sleep. 90 tablet 3   triamcinolone  cream (KENALOG) 0.1 % Apply 1 Application topically 2 (two) times daily. 30 g 0   divalproex  (DEPAKOTE  ER) 250 MG 24 hr tablet Take 1 tablet (250 mg total) by mouth as directed. Take daily for 2 weeks and take every other day for 3 doses and stop.     hydrOXYzine  (ATARAX ) 25 MG tablet Take 1 tablet (25 mg total) by mouth 3 (three) times daily as needed. 90 tablet 0   No current facility-administered medications for this visit.     Musculoskeletal: Strength & Muscle Tone: within normal limits Gait &  Station: normal Patient leans: N/A  Psychiatric Specialty Exam: Review of Systems  Psychiatric/Behavioral: Negative.      Blood pressure 128/84, pulse 64, temperature 98.3 F (36.8 C), temperature source Temporal, height 5' 7 (1.702 m), weight 253 lb 12.8 oz (115.1 kg), last menstrual period 10/04/2022, SpO2 93%.Body mass index is 39.75 kg/m.  General Appearance: Fairly Groomed  Eye Contact:  Fair  Speech:  Clear and Coherent  Volume:  Normal  Mood:  Euthymic  Affect:  Congruent  Thought Process:  Goal Directed and Descriptions of Associations: Intact  Orientation:  Full (Time, Place, and Person)  Thought Content: Logical   Suicidal Thoughts:  No  Homicidal Thoughts:  No  Memory:  Immediate;   Fair Recent;   Fair Remote;   Fair  Judgement:  Fair  Insight:  Fair  Psychomotor Activity:  Normal  Concentration:  Concentration: Fair and Attention Span: Fair  Recall:  [employer] of Knowledge: Fair  Language: Fair  Akathisia:  No  Handed:  Right  AIMS (if indicated): not done  Assets:  Communication Skills Desire for Improvement Housing Intimacy Leisure Time Social Support Talents/Skills  ADL's:  Intact  Cognition: WNL  Sleep:  Fair   Screenings: AUDIT    Flowsheet Row [facility]  Alcohol Use Disorder Identification Test Final Score (AUDIT) 0   GAD-7    Flowsheet Row Office Visit from 12/02/2023 in [facility]  [facility] Office Visit from 03/24/2023 in [facility] Office Visit from 03/18/2023 in [facility] Office Visit from 02/16/2023 in [facility] Office Visit from 02/11/2023 in [facility]  Total GAD-7 Score 2 3 5 3 11    PHQ2-9    Flowsheet Row Office Visit from 12/02/2023 in [facility] Office Visit from 08/18/2023 in [facility] Office Visit from 05/17/2023 in [facility] Office Visit from 03/24/2023 in [facility] Office Visit from 03/18/2023 in [facility]  PHQ-2 Total Score 1 0 0 2 2  PHQ-9 Total Score -- -- 2 10 6    Flowsheet Row Office Visit from 12/02/2023 in [facility] Admission (Discharged) from 10/07/2023 in [facility] Video Visit from 09/02/2023 in [facility]  C-SSRS RISK CATEGORY No Risk No Risk No Risk     Assessment and Plan: Sandra Montes is a 53 year old Caucasian female, divorced, has a history of depression, PTSD, history of comorbid substance use, currently in remission was evaluated in office today for a follow-up appointment.  Major depression in remission Currently reports mood symptoms are stable.  However concerned about weight gain side effects of Depakote  and is interested in coming off of it. Reduce Depakote  to 250 mg daily for 2 weeks and take it every other day for 3 doses and stop. Patient to monitor for any worsening mood symptoms Continue Duloxetine  20 mg daily  PTSD-stable Well managed on current medication regimen.  Motivated to continue to stay in therapy Continue Trazodone  50 mg at bedtime as needed Continue Hydroxyzine  25 mg 3 times a day as needed Continue therapy appointment with Mr.AJ Tschupp.  Follow-up Follow-up in clinic in 1 month or sooner if needed.   Collaboration of Care: Collaboration of Care: Referral or follow-up with counselor/therapist AEB  encouraged to continue psychotherapy sessions.  Patient/Guardian was advised Release of Information must be obtained prior to any record release in order to collaborate their care with an outside provider.  Patient/Guardian was advised if they have not already done so to contact the registration department to sign all necessary forms in order for us  to release information regarding their care.   Consent: Patient/Guardian gives verbal consent for treatment and assignment of benefits for services provided during this visit. Patient/Guardian expressed understanding and agreed to proceed.  This note was generated in part or whole with voice recognition software. Voice recognition is usually quite accurate but there are transcription errors that can and very often do occur. I apologize for any typographical errors that were not detected and corrected.     Zyaira Vejar, MD 12/03/2023, 7:40 AM

## 2023-12-03 ENCOUNTER — Ambulatory Visit: Payer: Self-pay | Attending: Cardiology | Admitting: Cardiology

## 2023-12-03 ENCOUNTER — Encounter: Payer: Self-pay | Admitting: Cardiology

## 2023-12-03 VITALS — BP 131/83 | HR 63 | Resp 16 | Ht 67.0 in | Wt 256.0 lb

## 2023-12-03 DIAGNOSIS — Z8249 Family history of ischemic heart disease and other diseases of the circulatory system: Secondary | ICD-10-CM

## 2023-12-03 DIAGNOSIS — I2584 Coronary atherosclerosis due to calcified coronary lesion: Secondary | ICD-10-CM

## 2023-12-03 DIAGNOSIS — E782 Mixed hyperlipidemia: Secondary | ICD-10-CM

## 2023-12-03 DIAGNOSIS — I2510 Atherosclerotic heart disease of native coronary artery without angina pectoris: Secondary | ICD-10-CM | POA: Diagnosis not present

## 2023-12-03 MED ORDER — EZETIMIBE 10 MG PO TABS: 10.0000 mg | ORAL_TABLET | Freq: Every day | ORAL | 3 refills | Status: AC

## 2023-12-03 MED ORDER — ATORVASTATIN CALCIUM 40 MG PO TABS
40.0000 mg | ORAL_TABLET | Freq: Every day | ORAL | 3 refills | Status: AC
Start: 2023-12-03 — End: ?

## 2023-12-03 NOTE — Patient Instructions (Addendum)
 Medication Instructions:  Refill for Ezetimibe  (Zetia ) and Atorvastatin  (Lipitor) have been sent to your pharmacy.  *If you need a refill on your cardiac medications before your next appointment, please call your pharmacy*  Lab Work: To be completed today: FASTING lipid panel and CMP  If you have labs (blood work) drawn today and your tests are completely normal, you will receive your results only by: MyChart Message (if you have MyChart) OR A paper copy in the mail If you have any lab test that is abnormal or we need to change your treatment, we will call you to review the results.  Testing/Procedures: Your physician has requested that you have en exercise stress myoview prior to your 1 year follow-up with Dr. Michele in 2026. For further information please visit [email or web address] Please follow instruction sheet, as given.   Follow-Up: At [facility], you and your health needs are our priority.  As part of our continuing mission to provide you with exceptional heart care, our providers are all part of one team.  This team includes your primary Cardiologist (physician) and Advanced Practice Providers or APPs (Physician Assistants and Nurse Practitioners) who all work together to provide you with the care you need, when you need it.  Your next appointment:   1 year(s)  Provider:   Madonna Michele, DO    We recommend signing up for the patient portal called MyChart.  Sign up information is provided on this After Visit Summary.  MyChart is used to connect with patients for Virtual Visits (Telemedicine).  Patients are able to view lab/test results, encounter notes, upcoming appointments, etc.  Non-urgent messages can be sent to your provider as well.   To learn more about what you can do with MyChart, go to [email or web address].   Other Instructions Exercise Myoview (Stress Test) Instructions  Please arrive 15 minutes prior to your appointment time for registration and  insurance purposes.   The test will take approximately 3 to 4 hours to complete; you may bring reading material.  If someone comes with you to your appointment, they will need to remain in the main lobby due to limited space in the testing area. **If you are pregnant or breastfeeding, please notify the nuclear lab prior to your appointment**   How to prepare for your Myocardial Perfusion Test: Do not eat or drink 3 hours prior to your test, except you may have water. Do not consume products containing caffeine (regular or decaffeinated) 12 hours prior to your test. (ex: coffee, chocolate, sodas, tea). Do bring a list of your current medications with you.  If not listed below, you may take your medications as normal. Hold Metoprolol  Succinate (Toprol -XL) the morning of the test Do wear comfortable clothes (no dresses or overalls) and walking shoes, tennis shoes preferred (No heels or open toe shoes are allowed). Do NOT wear cologne, perfume, aftershave, or lotions (deodorant is allowed). If these instructions are not followed, your test will have to be rescheduled.   Please report to [address] [place], [place] [address] for your test.  If you have questions or concerns about your appointment, you can call the Nuclear Lab at [phone].   If you cannot keep your appointment, please provide 24 hours notification to the Nuclear Lab, to avoid a possible $50 charge to your account.

## 2023-12-03 NOTE — Progress Notes (Signed)
 Cardiology Office Note:  .   Date:  12/03/2023  ID:  Sandra Montes, DOB Aug 07, 1970, MRN [id number] PCP:  Corwin Antu, FNP  Former Cardiology Providers: DR. ALEENE PASSE [facility] Providers Cardiologist:  Madonna Large, DO , [facility] (established care 12/03/23) Electrophysiologist:  None  Click to update primary MD,subspecialty MD or APP then REFRESH:1}    Chief Complaint  Patient presents with   Nonspecific abnormal electrocardiogram   Follow-up    History of Present Illness: .   Sandra Montes is a 53 y.o. Caucasian female whose past medical history and cardiovascular risk factors includes: Severe CAC, hypertension, former smoker (quit June 2024), mild COPD, family history of CAD, obesity due to excess calories.  Formally under the care of Dr. ALEENE PASSE who last saw Sandra Montes back in January 2025. I am seeing her for the first time to re-establishing care.   Shortly after establishing care with Dr. PASSE patient had an echocardiogram and coronary calcium  score.  She was noted to have severe CAC I was recommended to uptitrate lipid-lowering agents.  She is currently on Lipitor and Zetia  and tolerating both medications well.  Overall functional capacity remains relatively stable.  She denies any anginal chest pain or heart failure symptoms.  Since last office visit she had a GYN procedure as well as knee scope both went well without any cardiovascular complications.  Review of Systems: .   Review of Systems  Cardiovascular:  Positive for dyspnea on exertion (chronic and stable). Negative for chest pain, claudication, irregular heartbeat, leg swelling, near-syncope, orthopnea, palpitations, paroxysmal nocturnal dyspnea and syncope.  Respiratory:  Negative for shortness of breath.   Hematologic/Lymphatic: Negative for bleeding problem.    Studies Reviewed:   EKG: EKG Interpretation Date/Time:  Friday December 03 2023 08:45:40 EDT Ventricular Rate:   60 PR Interval:  176 QRS Duration:  108 QT Interval:  418 QTC Calculation: 418 R Axis:   30  Text Interpretation: Normal sinus rhythm Normal ECG When compared with ECG of 26-Jan-2010 12:11, Nonspecific T wave abnormality no longer evident in Anterior leads Confirmed by Large Madonna [phone]) on 12/03/2023 8:55:41 AM  Echocardiogram: 05/26/2023  1. Left ventricular ejection fraction, by estimation, is 55 to 60%. The left ventricle has normal function. The left ventricle has no regional  wall motion abnormalities. Left ventricular diastolic parameters are consistent with Grade I diastolic dysfunction (impaired relaxation). The average left ventricular global longitudinal strain is -20.7 %. The global longitudinal strain is normal.   2. Right ventricular systolic function is normal. The right ventricular size is normal.   3. The mitral valve is normal in structure. Trivial mitral valve regurgitation. No evidence of mitral stenosis.   4. The aortic valve is tricuspid. There is mild calcification of the aortic valve. Aortic valve regurgitation is trivial. Aortic valve sclerosis/calcification is present, without any evidence of aortic stenosis.   5. The inferior vena cava is normal in size with greater than 50% respiratory variability, suggesting right atrial pressure of 3 mmHg.   Coronary calcium  score: January 2025 1. Coronary calcium  score of 450. This was 99th percentile for age and sex matched control.   2. CAC >300 in LAD, LCx, RCA. CAC-DRS A3/N3.   3. Recommend aspirin  and statin if no contraindication.   4. Recommend cardiology consultation.   5. Continue heart healthy lifestyle and risk factor modification.  6.  Radiology over read: No significant findings  RADIOLOGY: N/A  Risk Assessment/Calculations:   NA  Labs:       Latest Ref Rng & Units 11/17/2023    2:17 PM 08/18/2023    9:07 AM 07/08/2023    6:38 AM  CBC  WBC 3.8 - 10.8 Thousand/uL 7.6  7.3  6.9   Hemoglobin 11.7  - 15.5 g/dL 86.0  85.6  86.3   Hematocrit 35.0 - 45.0 % 41.5  41.0  39.5   Platelets 140 - 400 Thousand/uL 151  175.0  151        Latest Ref Rng & Units 11/17/2023    2:17 PM 08/18/2023    9:07 AM 06/14/2023    8:04 AM  BMP  Glucose 65 - 99 mg/dL 886  886    BUN 7 - 25 mg/dL 15  14    Creatinine 9.49 - 1.03 mg/dL 9.49  9.28  9.37   BUN/Creat Ratio 6 - 22 (calc) SEE NOTE:     Sodium 135 - 146 mmol/L 140  140    Potassium 3.5 - 5.3 mmol/L 3.5  4.2    Chloride 98 - 110 mmol/L 97  98    CO2 20 - 32 mmol/L 32  34    Calcium  8.6 - 10.4 mg/dL 9.2  9.2        Latest Ref Rng & Units 11/17/2023    2:17 PM 08/18/2023    9:07 AM 06/14/2023    8:04 AM  CMP  Glucose 65 - 99 mg/dL 886  886    BUN 7 - 25 mg/dL 15  14    Creatinine 9.49 - 1.03 mg/dL 9.49  9.28  9.37   Sodium 135 - 146 mmol/L 140  140    Potassium 3.5 - 5.3 mmol/L 3.5  4.2    Chloride 98 - 110 mmol/L 97  98    CO2 20 - 32 mmol/L 32  34    Calcium  8.6 - 10.4 mg/dL 9.2  9.2    Total Protein 6.1 - 8.1 g/dL 7.0  6.7    Total Bilirubin 0.2 - 1.2 mg/dL 0.4  0.5    Alkaline Phos 39 - 117 U/L  50    AST 10 - 35 U/L 18  19    ALT 6 - 29 U/L 16  17      Lab Results  Component Value Date   CHOL 143 10/20/2022   HDL 47.40 10/20/2022   LDLCALC 84 10/20/2022   TRIG 61.0 10/20/2022   CHOLHDL 3 10/20/2022   Recent Labs    06/08/23 0749  LIPOA 49.9   No components found for: NTPROBNP No results for input(s): PROBNP in the last 8760 hours. Recent Labs    12/16/22 1536 08/18/23 0907 11/17/23 1417  TSH 2.239 4.19 2.52    Physical Exam:    Today's Vitals   12/03/23 0842  BP: 131/83  Pulse: 63  Resp: 16  SpO2: 96%  Weight: 256 lb (116.1 kg)  Height: 5' 7 (1.702 m)   Body mass index is 40.1 kg/m. Wt Readings from Last 3 Encounters:  12/03/23 256 lb (116.1 kg)  11/17/23 253 lb 12.8 oz (115.1 kg)  10/07/23 235 lb (106.6 kg)    Physical Exam  Constitutional: No distress.  hemodynamically stable  Neck: No JVD  present.  Cardiovascular: Normal rate, regular rhythm, S1 normal and S2 normal. Exam reveals no gallop, no S3 and no S4.  No murmur heard. Pulmonary/Chest: Effort normal and breath sounds normal. No stridor. She has no wheezes. She has no rales.  Musculoskeletal:        General: No edema.     Cervical back: Neck supple.  Skin: Skin is warm.     Impression & Recommendation(s):  Impression:   ICD-10-CM   1. Coronary atherosclerosis due to calcified coronary lesion  I25.10 EKG 12-Lead   I25.84 MYOCARDIAL PERFUSION IMAGING    Lipid panel    Comprehensive metabolic panel with GFR    Cardiac Stress Test: Informed Consent Details: Physician/Practitioner Attestation; Transcribe to consent form and obtain patient signature    Comprehensive metabolic panel with GFR    Lipid panel    2. Family history of coronary artery disease  Z82.49 Cardiac Stress Test: Informed Consent Details: Physician/Practitioner Attestation; Transcribe to consent form and obtain patient signature    3. Mixed hyperlipidemia  E78.2 Lipid panel    Comprehensive metabolic panel with GFR    atorvastatin  (LIPITOR) 40 MG tablet    ezetimibe  (ZETIA ) 10 MG tablet    Comprehensive metabolic panel with GFR    Lipid panel       Recommendation(s):  Coronary atherosclerosis due to calcified coronary lesion Family history of coronary artery disease Total CAC 450, 99 percentile Denies anginal chest pain or heart failure symptoms. Continue aspirin  81 mg p.o. daily Continue lipid-lowering agents: Zetia  10 mg p.o. daily and atorvastatin  40 mg p.o. nightly Prior echocardiogram results reviewed. Recommend an exercise nuclear stress test to evaluate for functional capacity, exercise-induced arrhythmia/ischemia, and perfusion given the fact that she has severe CAC, family history of coronary artery disease, multiple cardiovascular risk factors.  Will plan prior to next office visit sooner if needed  Mixed hyperlipidemia Currently  on atorvastatin  40 mg p.o. daily and Zetia  10 mg p.o. daily Recheck fasting lipids and CMP She denies myalgia or other side effects. Cardiology is following peripherally.   Orders Placed:  Orders Placed This Encounter  Procedures   Lipid panel    Standing Status:   Future    Number of Occurrences:   1    Expected Date:   12/03/2023    Expiration Date:   12/02/2024   Comprehensive metabolic panel with GFR    Standing Status:   Future    Number of Occurrences:   1    Expected Date:   12/03/2023    Expiration Date:   12/02/2024   Cardiac Stress Test: Informed Consent Details: Physician/Practitioner Attestation; Transcribe to consent form and obtain patient signature    Physician/Practitioner attestation of informed consent for procedure/surgical case:   I, the physician/practitioner, attest that I have discussed with the patient the benefits, risks, side effects, alternatives, likelihood of achieving goals and potential problems during recovery for the procedure that I have provided informed consent.    Procedure:   Exercise nuclear stress test    Indication/Reason:   Coronary Atherosclerosis   MYOCARDIAL PERFUSION IMAGING    Standing Status:   Future    Expected Date:   10/02/2024    Patient weight in lbs:   256    Where [facility] this be performed[facility]    Type of stress:   Exercise   EKG 12-Lead     Final Medication List:    Meds ordered this encounter  Medications   atorvastatin  (LIPITOR) 40 MG tablet    Sig: Take 1 tablet (40 mg total) by mouth daily.    Dispense:  90 tablet    Refill:  3   ezetimibe  (ZETIA ) 10 MG tablet  Sig: Take 1 tablet (10 mg total) by mouth daily.    Dispense:  90 tablet    Refill:  3    Medications Discontinued During This Encounter  Medication Reason   atorvastatin  (LIPITOR) 40 MG tablet Reorder   ezetimibe  (ZETIA ) 10 MG tablet Reorder     Current Outpatient Medications:    acetaminophen  (TYLENOL ) 650 MG CR tablet, Take 650 mg by  mouth every 8 (eight) hours as needed for pain., Disp: , Rfl:    albuterol  (VENTOLIN  HFA) 108 (90 Base) MCG/ACT inhaler, INHALE 2 PUFFS INTO THE LUNGS EVERY 4 HOURS AS NEEDED FOR WHEEZING OR SHORTNESS OF BREATH, Disp: 8.5 g, Rfl: 0   aspirin  EC 81 MG tablet, Take 81 mg by mouth daily. Swallow whole., Disp: , Rfl:    B Complex-C (B-COMPLEX WITH VITAMIN C) tablet, Take 1 tablet by mouth in the morning., Disp: , Rfl:    Calcium  Citrate-Vitamin D (CITRACAL PETITES/VITAMIN D PO), Take 1 tablet by mouth in the morning and at bedtime., Disp: , Rfl:    cetirizine (ZYRTEC) 10 MG tablet, Take 10 mg by mouth daily., Disp: , Rfl:    divalproex  (DEPAKOTE  ER) 250 MG 24 hr tablet, Take 1 tablet (250 mg total) by mouth as directed. Take daily for 2 weeks and take every other day for 3 doses and stop., Disp: , Rfl:    DULoxetine  (CYMBALTA ) 20 MG capsule, Take 1 capsule (20 mg total) by mouth 2 (two) times daily., Disp: 180 capsule, Rfl: 1   hydrochlorothiazide  (HYDRODIURIL ) 25 MG tablet, Take 1 tablet (25 mg total) by mouth daily., Disp: 90 tablet, Rfl: 3   hydrOXYzine  (ATARAX ) 25 MG tablet, Take 1 tablet (25 mg total) by mouth 3 (three) times daily as needed., Disp: 90 tablet, Rfl: 0   irbesartan  (AVAPRO ) 300 MG tablet, Take 1 tablet (300 mg total) by mouth daily., Disp: 90 tablet, Rfl: 3   metoprolol  succinate (TOPROL -XL) 50 MG 24 hr tablet, Take 1 tablet (50 mg total) by mouth daily. Take with or immediately following a meal., Disp: 90 tablet, Rfl: 3   Omega-3 1400 MG CAPS, Take 2,800 mg by mouth daily., Disp: , Rfl:    potassium chloride  (KLOR-CON ) 10 MEQ tablet, Take 1 tablet (10 mEq total) by mouth 2 (two) times daily., Disp: 180 tablet, Rfl: 3   traZODone  (DESYREL ) 50 MG tablet, Take 1 tablet (50 mg total) by mouth at bedtime as needed for sleep., Disp: 90 tablet, Rfl: 3   triamcinolone  cream (KENALOG) 0.1 %, Apply 1 Application topically 2 (two) times daily., Disp: 30 g, Rfl: 0   atorvastatin  (LIPITOR) 40 MG  tablet, Take 1 tablet (40 mg total) by mouth daily., Disp: 90 tablet, Rfl: 3   ezetimibe  (ZETIA ) 10 MG tablet, Take 1 tablet (10 mg total) by mouth daily., Disp: 90 tablet, Rfl: 3  Consent:   NA  Disposition:   1 year follow-up sooner if needed  Her questions and concerns were addressed to her satisfaction. She voices understanding of the recommendations provided during this encounter.    Signed, Madonna Michele HAS, [facility]  A Division of [facility] [address] [place], [place] [address]  [place], [place] [address] 12/03/2023 6:46 PM

## 2023-12-04 LAB — COMPREHENSIVE METABOLIC PANEL WITH GFR
ALT: 16 IU/L (ref 0–32)
AST: 18 IU/L (ref 0–40)
Albumin: 4 g/dL (ref 3.8–4.9)
Alkaline Phosphatase: 68 IU/L (ref 44–121)
BUN/Creatinine Ratio: 17 (ref 9–23)
BUN: 12 mg/dL (ref 6–24)
Bilirubin Total: 0.3 mg/dL (ref 0.0–1.2)
CO2: 26 mmol/L (ref 20–29)
Calcium: 9.3 mg/dL (ref 8.7–10.2)
Chloride: 96 mmol/L (ref 96–106)
Creatinine, Ser: 0.69 mg/dL (ref 0.57–1.00)
Globulin, Total: 2.7 g/dL (ref 1.5–4.5)
Glucose: 115 mg/dL — ABNORMAL HIGH (ref 70–99)
Potassium: 4 mmol/L (ref 3.5–5.2)
Sodium: 138 mmol/L (ref 134–144)
Total Protein: 6.7 g/dL (ref 6.0–8.5)
eGFR: 104 mL/min/1.73 (ref >59)

## 2023-12-04 LAB — LIPID PANEL
Chol/HDL Ratio: 2.5 ratio (ref 0.0–4.4)
Cholesterol, Total: 138 mg/dL (ref 100–199)
HDL: 55 mg/dL (ref >39)
LDL Chol Calc (NIH): 66 mg/dL (ref 0–99)
Triglycerides: 89 mg/dL (ref 0–149)
VLDL Cholesterol Cal: 17 mg/dL (ref 5–40)

## 2023-12-06 ENCOUNTER — Ambulatory Visit: Payer: Self-pay

## 2023-12-07 ENCOUNTER — Ambulatory Visit
Admission: RE | Admit: 2023-12-07 | Discharge: 2023-12-07 | Disposition: A | Discharge status: Home / Self Care | Source: Ambulatory Visit | Attending: Acute Care | Admitting: Acute Care

## 2023-12-07 DIAGNOSIS — F17200 Nicotine dependence, unspecified, uncomplicated: Secondary | ICD-10-CM | POA: Insufficient documentation

## 2023-12-07 DIAGNOSIS — Z87891 Personal history of nicotine dependence: Secondary | ICD-10-CM | POA: Insufficient documentation

## 2023-12-08 ENCOUNTER — Encounter: Payer: Self-pay | Admitting: Orthopedic Surgery

## 2023-12-14 ENCOUNTER — Ambulatory Visit: Payer: 59 | Admitting: Dermatology

## 2023-12-14 DIAGNOSIS — L719 Rosacea, unspecified: Secondary | ICD-10-CM

## 2023-12-14 DIAGNOSIS — Z1283 Encounter for screening for malignant neoplasm of skin: Secondary | ICD-10-CM | POA: Diagnosis not present

## 2023-12-14 DIAGNOSIS — L578 Other skin changes due to chronic exposure to nonionizing radiation: Secondary | ICD-10-CM | POA: Diagnosis not present

## 2023-12-14 DIAGNOSIS — I781 Nevus, non-neoplastic: Secondary | ICD-10-CM

## 2023-12-14 DIAGNOSIS — W908XXA Exposure to other nonionizing radiation, initial encounter: Secondary | ICD-10-CM

## 2023-12-14 DIAGNOSIS — L814 Other melanin hyperpigmentation: Secondary | ICD-10-CM | POA: Diagnosis not present

## 2023-12-14 DIAGNOSIS — L603 Nail dystrophy: Secondary | ICD-10-CM

## 2023-12-14 DIAGNOSIS — D1801 Hemangioma of skin and subcutaneous tissue: Secondary | ICD-10-CM

## 2023-12-14 DIAGNOSIS — L821 Other seborrheic keratosis: Secondary | ICD-10-CM

## 2023-12-14 DIAGNOSIS — M67441 Ganglion, right hand: Secondary | ICD-10-CM

## 2023-12-14 MED ORDER — METRONIDAZOLE 0.75 % EX CREA: TOPICAL_CREAM | CUTANEOUS | 5 refills | Status: DC

## 2023-12-14 NOTE — Progress Notes (Signed)
 Follow-Up Visit   Subjective  Sandra Montes is a 53 y.o. female who presents for the following: Skin Cancer Screening and Full Body Skin Exam. No hx of skin cancer. Positive family hx of skin cancer. Patient has one are of concern on middle finger of R hand; redness around the nail w/rough bumps on the nail itself. Patient reports taking off artificial nails about 8 weeks ago. Patient was given triamcinolone  cream (KENALOG) 0.1 % for nail problem by her PCP that did not clear up issue. Pt has rosacea and uses topical metrocream  to face daily.  The patient presents for Total-Body Skin Exam (TBSE) for skin cancer screening and mole check. The patient has spots, moles and lesions to be evaluated, some may be new or changing and the patient may have concern these could be cancer.    The following portions of the chart were reviewed this encounter and updated as appropriate: medications, allergies, medical history  Review of Systems:  No other skin or systemic complaints except as noted in HPI or Assessment and Plan.  Objective  Well appearing patient in no apparent distress; mood and affect are within normal limits.  A full examination was performed including scalp, head, eyes, ears, nose, lips, neck, chest, axillae, abdomen, back, buttocks, bilateral upper extremities, bilateral lower extremities, hands, feet, fingers, toes, fingernails, and toenails. All findings within normal limits unless otherwise noted below.   Relevant physical exam findings are noted in the Assessment and Plan.    Assessment & Plan   SKIN CANCER SCREENING PERFORMED TODAY.  ACTINIC DAMAGE - Chronic condition, secondary to cumulative UV/sun exposure - diffuse scaly erythematous macules with underlying dyspigmentation - Recommend daily broad spectrum sunscreen SPF 30+ to sun-exposed areas, reapply every 2 hours as needed.  - Staying in the shade or wearing long sleeves, sun glasses (UVA+UVB protection) and  wide brim hats (4-inch brim around the entire circumference of the hat) are also recommended for sun protection.  - Call for new or changing lesions.  LENTIGINES, SEBORRHEIC KERATOSES, HEMANGIOMAS - Benign normal skin lesions  - Brown macules x2 on lower lip (Lentigo's)- rec lip balm with sunscreen - Waxy tan macules/papules on legs c/w SKs - Benign-appearing - Call for any changes  MELANOCYTIC NEVI  - Tan-brown and/or pink-flesh-colored symmetric macules and papules including L posterior shoulder - 2.24mm medium brown macule at left neck (nevus vs lentigo)  Benign-appearing. Stable compared to previous visit. Observation.  Call clinic for new or changing moles.  Recommend daily use of broad spectrum spf 30+ sunscreen to sun-exposed areas.    TELANGIECTASIA Exam: dilated blood vessels on chest   Treatment Plan: Benign appearing on exam Call for changes   HEMANGIOMA Exam: red and purple papules, including left chest Discussed benign nature. Recommend observation. Call for changes.   Sebaceous Hyperplasia - Small yellow papules with a central dell of the nose - Benign-appearing - Observe. Call for changes.   ROSACEA Exam Nose and malar checks erythema with telangiectasias    Chronic and persistent condition with duration or expected duration over one year. Condition is improving with treatment but not currently at goal.     Rosacea is a chronic progressive skin condition usually affecting the face of adults, causing redness and/or acne bumps. It is treatable but not curable. It sometimes affects the eyes (ocular rosacea) as well. It may respond to topical and/or systemic medication and can flare with stress, sun exposure, alcohol, exercise, topical steroids (including hydrocortisone/cortisone 10) and some  foods.  Daily application of broad spectrum spf 30+ sunscreen to face is recommended to reduce flares.   Patient denies grittiness of the eyes   Treatment Plan continue  metronidazole  0.75% cream Apply to nose and cheeks 1-2 times a day dsp 45g 1 yr Rf.    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 Vertical depressed groove at the right third nail plate. Cuticle absent. Joint enlargement.   A digital mucous cyst also known as a myxoid cyst or pseudocyst is a ganglion cyst arising from the distal interphalangeal (DIP) joint of the finger or thumb (or, less commonly, toe). The cysts are believed to form from degeneration of connective tissue and are associated with osteoarthritic joints or injury. Although the exact etiology is unknown, it is likely that a small tear forms in a joint capsule or tendon sheath, allowing extravasation of synovial fluid into the adjacent tissue. When the fluid reacts with local tissue, it becomes more gelatinous and a cyst wall forms. With any treatment, there is a high rate of recurrence.   Treatment options include: - Puncture / Incision & Drainage (I&D) - Intralesional steroid injection - Intralesional Sclerosant injection (Asclera/ Polidocanol) - Intralesional steroid + sclerosant - Corticosteroid tape - Cryosurgery - Laser (CO2) - Infrared photocoagulation - Excision / Surgery  Treatment Plan: Discussed referral to hand surgeon- patient declined Discussed nail dystrophy likely to stay Continue Triamcinolone  cream given by PCP 1-2 times daily as needed for itching.  Do not push down cuticle.      Return in about 1 year (around 12/13/2024) for TBSE.  I, Emerick Ege, CMA am acting as scribe for Rexene Rattler, MD.    Documentation: I have reviewed the above documentation for accuracy and completeness, and I agree with the above.  Rexene Rattler, MD

## 2023-12-14 NOTE — Patient Instructions (Addendum)
 Continue Triamcinolone  cream given by PCP 1-2 times daily as needed for itching on finger.  Do not push back cuticle.   Melanoma ABCDEs  Melanoma is the most dangerous type of skin cancer, and is the leading cause of death from skin disease.  You are more likely to develop melanoma if you: Have light-colored skin, light-colored eyes, or red or blond hair Spend a lot of time in the sun Tan regularly, either outdoors or in a tanning bed Have had blistering sunburns, especially during childhood Have a close family member who has had a melanoma Have atypical moles or large birthmarks  Early detection of melanoma is key since treatment is typically straightforward and cure rates are extremely high if we catch it early.   The first sign of melanoma is often a change in a mole or a new dark spot.  The ABCDE system is a way of remembering the signs of melanoma.  A for asymmetry:  The two halves do not match. B for border:  The edges of the growth are irregular. C for color:  A mixture of colors are present instead of an even brown color. D for diameter:  Melanomas are usually (but not always) greater than 6mm - the size of a pencil eraser. E for evolution:  The spot keeps changing in size, shape, and color.  Please check your skin once per month between visits. You can use a small mirror in front and a large mirror behind you to keep an eye on the back side or your body.   If you see any new or changing lesions before your next follow-up, please call to schedule a visit.  Please continue daily skin protection including broad spectrum sunscreen SPF 30+ to sun-exposed areas, reapplying every 2 hours as needed when you're outdoors.    Recommend daily broad spectrum sunscreen SPF 30+ to sun-exposed areas, reapply every 2 hours as needed. Call for new or changing lesions.  Staying in the shade or wearing long sleeves, sun glasses (UVA+UVB protection) and wide brim hats (4-inch brim around the entire  circumference of the hat) are also recommended for sun protection.    Due to recent changes in healthcare laws, you may see results of your pathology and/or laboratory studies on MyChart before the doctors have had a chance to review them. We understand that in some cases there may be results that are confusing or concerning to you. Please understand that not all results are received at the same time and often the doctors may need to interpret multiple results in order to provide you with the best plan of care or course of treatment. Therefore, we ask that you please give us  2 business days to thoroughly review all your results before contacting the office for clarification. Should we see a critical lab result, you will be contacted sooner.   If You Need Anything After Your Visit  If you have any questions or concerns for your doctor, please call our main line at [phone] and press option 4 to reach your doctor's medical assistant. If no one answers, please leave a voicemail as directed and we will return your call as soon as possible. Messages left after 4 pm will be answered the following business day.   You may also send us  a message via MyChart. We typically respond to MyChart messages within 1-2 business days.  For prescription refills, please ask your pharmacy to contact our office. Our fax number is [phone].  If you have  an urgent issue when the clinic is closed that cannot wait until the next business day, you can page your doctor at the number below.    Please note that while we do our best to be available for urgent issues outside of office hours, we are not available 24/7.   If you have an urgent issue and are unable to reach us , you may choose to seek medical care at your doctor's office, retail clinic, urgent care center, or emergency room.  If you have a medical emergency, please immediately call 911 or go to the emergency department.  Pager Numbers  - Dr. Hester:  [phone]  - Dr. Jackquline: [phone]  - Dr. Claudene: [phone]   In the event of inclement weather, please call our main line at [phone] for an update on the status of any delays or closures.  Dermatology Medication Tips: Please keep the boxes that topical medications come in in order to help keep track of the instructions about where and how to use these. Pharmacies typically print the medication instructions only on the boxes and not directly on the medication tubes.   If your medication is too expensive, please contact our office at [phone] option 4 or send us  a message through MyChart.   We are unable to tell what your co-pay for medications will be in advance as this is different depending on your insurance coverage. However, we may be able to find a substitute medication at lower cost or fill out paperwork to get insurance to cover a needed medication.   If a prior authorization is required to get your medication covered by your insurance company, please allow us  1-2 business days to complete this process.  Drug prices often vary depending on where the prescription is filled and some pharmacies may offer cheaper prices.  The website www.goodrx.com contains coupons for medications through different pharmacies. The prices here do not account for what the cost may be with help from insurance (it may be cheaper with your insurance), but the website can give you the price if you did not use any insurance.  - You can print the associated coupon and take it with your prescription to the pharmacy.  - You may also stop by our office during regular business hours and pick up a GoodRx coupon card.  - If you need your prescription sent electronically to a different pharmacy, notify our office through [facility] or by phone at [phone] option 4.     Si Usted Necesita Algo Despus de Su Visita  Tambin puede enviarnos un mensaje a travs de Clinical cytogeneticist. Por lo general  respondemos a los mensajes de MyChart en el transcurso de 1 a 2 das hbiles.  Para renovar recetas, por favor pida a su farmacia que se ponga en contacto con nuestra oficina. Randi lakes de fax es [place] [phone].  Si tiene un asunto urgente cuando la clnica est cerrada y que no puede esperar hasta el siguiente da hbil, puede llamar/localizar a su doctor(a) al nmero que aparece a continuacin.   Por favor, tenga en cuenta que aunque hacemos todo lo posible para estar disponibles para asuntos urgentes fuera del horario de [place], no estamos disponibles las 24 horas del da, los 7 [address]  [address] de la [place].   Si tiene un problema urgente y no puede comunicarse con nosotros, puede optar por buscar atencin mdica  en el consultorio de su doctor(a), en una clnica privada, en un centro de atencin urgente o en una sala  de emergencias.  Si tiene Engineer, drilling, por favor llame inmediatamente al 911 o vaya a la sala de emergencias.  Nmeros de bper  - Dr. Hester: [phone]  - Dra. Jackquline: [phone]  - Dr. Claudene: [phone]   En caso de inclemencias del tiempo, por favor llame a landry capes principal al [phone] para una actualizacin sobre el [place] de cualquier retraso o cierre.  Consejos para la medicacin en dermatologa: Por favor, guarde las cajas en las que vienen los medicamentos de uso tpico para ayudarle a seguir las instrucciones sobre dnde y cmo usarlos. Las farmacias generalmente imprimen las instrucciones del medicamento slo en las cajas y no directamente en los tubos del [place].   Si su medicamento es muy caro, por favor, pngase en contacto con landry rieger llamando al [phone] y presione la opcin 4 o envenos un mensaje a travs de Clinical cytogeneticist.   No podemos decirle cul ser su copago por los medicamentos por adelantado ya que esto es diferente dependiendo de la cobertura de su seguro. Sin embargo, es posible que podamos encontrar un  medicamento sustituto a Audiological scientist un formulario para que el seguro cubra el medicamento que se considera necesario.   Si se requiere una autorizacin previa para que su compaa de seguros malta su medicamento, por favor permtanos de 1 a 2 das hbiles para completar este proceso.  Los precios de los medicamentos varan con frecuencia dependiendo del Environmental consultant de dnde se surte la receta y alguna farmacias pueden ofrecer precios ms baratos.  El sitio web www.goodrx.com tiene cupones para medicamentos de Health and safety inspector. Los precios aqu no tienen en cuenta lo que podra costar con la ayuda del seguro (puede ser ms barato con su seguro), pero el sitio web puede darle el precio si no utiliz Tourist information centre manager.  - Puede imprimir el cupn correspondiente y llevarlo con su receta a la farmacia.  - Tambin puede pasar por nuestra oficina durante el horario de atencin regular y Education officer, museum una tarjeta de cupones de GoodRx.  - Si necesita que su receta se enve electrnicamente a una farmacia diferente, informe a nuestra oficina a travs de MyChart de [place] o por telfono llamando al [phone] y presione la opcin 4.

## 2023-12-15 ENCOUNTER — Ambulatory Visit: Admitting: Psychiatry

## 2023-12-21 ENCOUNTER — Other Ambulatory Visit: Payer: Self-pay

## 2023-12-21 DIAGNOSIS — F17210 Nicotine dependence, cigarettes, uncomplicated: Secondary | ICD-10-CM

## 2023-12-21 DIAGNOSIS — Z87891 Personal history of nicotine dependence: Secondary | ICD-10-CM

## 2023-12-21 DIAGNOSIS — Z122 Encounter for screening for malignant neoplasm of respiratory organs: Secondary | ICD-10-CM

## 2023-12-28 ENCOUNTER — Encounter: Admitting: Orthopedic Surgery

## 2023-12-31 ENCOUNTER — Encounter: Payer: Self-pay | Admitting: Psychiatry

## 2023-12-31 ENCOUNTER — Telehealth: Admitting: Psychiatry

## 2023-12-31 DIAGNOSIS — F331 Major depressive disorder, recurrent, moderate: Secondary | ICD-10-CM | POA: Diagnosis not present

## 2023-12-31 DIAGNOSIS — F4310 Post-traumatic stress disorder, unspecified: Secondary | ICD-10-CM

## 2023-12-31 MED ORDER — GABAPENTIN 300 MG PO CAPS
300.0000 mg | ORAL_CAPSULE | Freq: Every day | ORAL | 0 refills | Status: DC
Start: 2023-12-31 — End: 2024-01-28

## 2023-12-31 NOTE — Progress Notes (Signed)
 Virtual Visit via Video Note  I connected with Sandra Montes on 12/31/23 at  9:30 AM EDT by a video enabled telemedicine application and verified that I am speaking with the correct person using two identifiers.  Location Provider Location : [facility] Patient Location : Home  Participants: Patient , Provider   I discussed the limitations of evaluation and management by telemedicine and the availability of in person appointments. The patient expressed understanding and agreed to proceed.  I discussed the assessment and treatment plan with the patient. The patient was provided an opportunity to ask questions and all were answered. The patient agreed with the plan and demonstrated an understanding of the instructions.   The patient was advised to call back or seek an in-person evaluation if the symptoms worsen or if the condition fails to improve as anticipated.  [facility] MD OP Progress Note  12/31/2023 1:43 PM RYEN HEITMEYER  MRN:  [id number]  Chief Complaint:  Chief Complaint  Patient presents with   Medication Refill   Follow-up   Anxiety   Depression   Irritability   Discussed the use of AI scribe software for clinical note transcription with the patient, who gave verbal consent to proceed.  History of Present Illness Sandra Montes is a 53 year old Caucasian female, divorced, currently employed, lives in [place], has a history of MDD, PTSD, history of alcohol and cannabis abuse in remission, emphysema, hypertension, asthma, osteoarthritis was evaluated by telemedicine today.  Since her last visit, she has experienced increased anger and feeling more down, which she reports relates in part to ongoing and worsening knee pain. She notes a decline in her mood and states that people around her have also noticed this change. She expresses a need for a mood stabilizer. She continues to take Cymbalta  and reports that it helps with pain. She also continues to take trazodone   nightly for sleep, after she previously was able to go without it, and states that trazodone  is effective for sleep. Although she describes a decreased appetite, she reports eating regular meals and participating in a nutrition-based program. She continues to see her therapist weekly and describes therapy as going well. Over the past month, she has taken hydroxyzine  more frequently. She denies any thoughts about hurting herself.She reports no recent PTSD-related symptoms.  She previously trialed Depakote , which she discontinued due to weight gain. After she had abdominal surgery in March, she used gabapentin  short-term and reports tolerating it well.  Agreeable to trial of gabapentin  to address her mood symptoms.  She engages in physical therapy for knee pain. She expresses a desire to continue working and remain physically active.  She denies any other concerns today.    Visit Diagnosis:    ICD-10-CM   1. Moderate episode of recurrent major depressive disorder (HCC)  F33.1 gabapentin  (NEURONTIN ) 300 MG capsule   Likely with mixed features    2. PTSD (post-traumatic stress disorder)  F43.10 gabapentin  (NEURONTIN ) 300 MG capsule      Past Psychiatric History: I have reviewed past psychiatric history from progress note on 03/24/2023.  Past trials of medications like Pristiq, Wellbutrin, Depakote -weight gain, Paxil, Prozac.  Past Medical History:  Past Medical History:  Diagnosis Date   Adnexal mass 05/17/2022   a.) CT A/P 05/18/2023: 17.0 x 16.3 x 12.8 cm --> favored to be a large benign fluid filled ovarian cyst --> surgical resection recommended/pending   Anxiety    Aortic atherosclerosis (HCC)    Bartholin's gland abscess  CAD (coronary artery disease) 06/04/2023   a.) cCTA 06/04/2023: Ca2+ = 450 (99th %'ile) --> 27.6 LAD, 66.1 LCx, 356 RCA   Chronic left shoulder pain    Depression    Diastolic dysfunction 05/26/2023   a.) TTE 05/26/2023: EF 55-60%, no RWMAs, G1DD, triv AR/MR,  mild AoC sclerosis without stenosis   DOE (dyspnea on exertion)    Emphysema lung (HCC)    History of 2019 novel coronavirus disease (COVID-19) 12/13/2020   History of alcohol abuse    History of allergy     History of cannabis abuse    History of suicidal ideation 09/14/2022   a.) scared to do it; increasing intrusive thoughts about jumping off a cliff because she is tired of fighting; (+) crying, anxiety, depression, inturrupted sleep, appetite changes related to relationship problems (broke up with BF) and what sounds like caregiver role strain (caregiver to very demanding mother); (+) polysubstance use/abuse   HLD (hyperlipidemia)    Hyperlipidemia    Hypertension    Insomnia    a.) uses trazodone  PRN   Irritable bowel syndrome with diarrhea    Nephrolithiasis    Pain in both hands    Polyarthralgia    Positive ANA (antinuclear antibody)    PTSD (post-traumatic stress disorder)    RBBB (right bundle branch block)    Sinus bradycardia    Urinary incontinence    Weight gain due to medication    a.) significant (40lb) weight gain with the initiation of divalproex     Past Surgical History:  Procedure Laterality Date   ABDOMINAL HYSTERECTOMY  07/08/2023   CESAREAN SECTION N/A 1993   CHOLECYSTECTOMY N/A 1996   COLONOSCOPY WITH PROPOFOL  N/A 11/18/2021   Procedure: COLONOSCOPY WITH PROPOFOL ;  Surgeon: Unk Corinn Skiff, MD;  Location: [facility];  Service: Gastroenterology;  Laterality: N/A;   KNEE ARTHROSCOPY WITH MEDIAL MENISECTOMY Right 10/07/2023   Procedure: ARTHROSCOPY, KNEE, WITH MEDIAL MENISCECTOMY;  Surgeon: Onesimo Oneil LABOR, MD;  Location: [facility];  Service: Orthopedics;  Laterality: Right;   ROBOTIC ASSISTED BILATERAL SALPINGO OOPHERECTOMY Bilateral 07/08/2023   Procedure: XI ROBOTIC ASSISTED BILATERAL SALPINGO OOPHORECTOMY, REMOVAL OF ADNEXAL MASS;  Surgeon: Verdon Keen, MD;  Location: [facility];  Service: Gynecology;  Laterality: Bilateral;   TUBAL LIGATION  Bilateral 1995    Family Psychiatric History: I have reviewed family psychiatric history from progress note on 03/24/2023.  Family History:  Family History  Problem Relation Age of Onset   Depression Mother    Alcohol abuse Mother    Heart attack Mother 27   Skin cancer Father    Alcohol abuse Father    Prostate cancer Father    Rheum arthritis Paternal Grandmother    Prostate cancer Paternal Grandfather    Skin cancer Paternal Grandfather    Asthma Daughter    Mental illness Daughter        borderline   Mitral valve prolapse Daughter        & enlarged aorta   Migraines Daughter    Alcohol abuse Cousin    Healthy Son    Healthy Son     Social History: I have reviewed social history from progress note on 03/24/2023. Social History   Socioeconomic History   Marital status: Divorced    Spouse name: Not on file   Number of children: 3   Years of education: 12   Highest education level: Associate degree: occupational, Scientist, product/process development, or vocational program  Occupational History    Employer: [employer]    Comment:  mortgage insurance  Tobacco Use   Smoking status: Former    Current packs/day: 0.00    Average packs/day: 1.7 packs/day for 57.0 years (95.5 ttl pk-yrs)    Types: Cigarettes    Start date: 2004    Quit date: 2024    Years since quitting: 1.6    Passive exposure: Current   Smokeless tobacco: Never   Tobacco comments:    Pt smokes two packs cigarettes for the past 20 yrs, wants 14 mg patch  Vaping Use   Vaping status: Never Used  Substance and Sexual Activity   Alcohol use: Not Currently    Comment: h/o alcoholism quit 2017   Drug use: Yes    Types: Marijuana    Comment: occ   Sexual activity: Not Currently    Partners: Male    Birth control/protection: None  Other Topics Concern   Not on file  Social History Narrative   ** Merged History Encounter **       Active smoker- lives in [place]; with mother- care giver; with quality/control-  mortgage.    Social Drivers of Health   Financial Resource Strain: Medium Risk (05/14/2023)   Overall Financial Resource Strain (CARDIA)    Difficulty of Paying Living Expenses: Somewhat hard  Food Insecurity: No Food Insecurity (05/14/2023)   Hunger Vital Sign    Worried About Running Out of Food in the Last Year: Never true    Ran Out of Food in the Last Year: Never true  Transportation Needs: No Transportation Needs (05/14/2023)   PRAPARE - Administrator, Civil Service (Medical): No    Lack of Transportation (Non-Medical): No  Physical Activity: Unknown (05/14/2023)   Exercise Vital Sign    Days of Exercise per Week: 0 days    Minutes of Exercise per Session: Not on file  Stress: No Stress Concern Present (05/14/2023)   [employer] of Occupational Health - Occupational Stress Questionnaire    Feeling of Stress : Only a little  Social Connections: Socially Isolated (05/14/2023)   Social Connection and Isolation Panel    Frequency of Communication with Friends and Family: More than three times a week    Frequency of Social Gatherings with Friends and Family: Once a week    Attends Religious Services: Never    Database administrator or Organizations: No    Attends Engineer, structural: Not on file    Marital Status: Divorced    Allergies:  Allergies  Allergen Reactions   Buspar [Buspirone] Other (See Comments)    Optical migraines   Lisinopril Cough   Morphine Itching and Other (See Comments)    tried to take her face off due to itching   Norvasc [Amlodipine] Swelling   Sulfa Antibiotics Other (See Comments)    Passes out    Metabolic Disorder Labs: Lab Results  Component Value Date   HGBA1C 6.1 08/18/2023   MPG 111 07/04/2021   Lab Results  Component Value Date   PROLACTIN 4.3 01/13/2022   Lab Results  Component Value Date   CHOL 138 12/03/2023   TRIG 89 12/03/2023   HDL 55 12/03/2023   CHOLHDL 2.5 12/03/2023   VLDL 87.7 10/20/2022    LDLCALC 66 12/03/2023   LDLCALC 84 10/20/2022   Lab Results  Component Value Date   TSH 2.52 11/17/2023   TSH 4.19 08/18/2023    Therapeutic Level Labs: No results found for: LITHIUM Lab Results  Component Value Date   VALPROATE 54.0  05/17/2023   VALPROATE 64.6 01/01/2023   No results found for: CBMZ  Current Medications: Current Outpatient Medications  Medication Sig Dispense Refill   gabapentin  (NEURONTIN ) 300 MG capsule Take 1 capsule (300 mg total) by mouth at bedtime. 90 capsule 0   acetaminophen  (TYLENOL ) 650 MG CR tablet Take 650 mg by mouth every 8 (eight) hours as needed for pain.     albuterol  (VENTOLIN  HFA) 108 (90 Base) MCG/ACT inhaler INHALE 2 PUFFS INTO THE LUNGS EVERY 4 HOURS AS NEEDED FOR WHEEZING OR SHORTNESS OF BREATH 8.5 g 0   aspirin  EC 81 MG tablet Take 81 mg by mouth daily. Swallow whole.     atorvastatin  (LIPITOR) 40 MG tablet Take 1 tablet (40 mg total) by mouth daily. 90 tablet 3   B Complex-C (B-COMPLEX WITH VITAMIN C) tablet Take 1 tablet by mouth in the morning.     Calcium  Citrate-Vitamin D (CITRACAL PETITES/VITAMIN D PO) Take 1 tablet by mouth in the morning and at bedtime.     cetirizine (ZYRTEC) 10 MG tablet Take 10 mg by mouth daily.     DULoxetine  (CYMBALTA ) 20 MG capsule Take 1 capsule (20 mg total) by mouth 2 (two) times daily. 180 capsule 1   ezetimibe  (ZETIA ) 10 MG tablet Take 1 tablet (10 mg total) by mouth daily. 90 tablet 3   hydrochlorothiazide  (HYDRODIURIL ) 25 MG tablet Take 1 tablet (25 mg total) by mouth daily. 90 tablet 3   hydrOXYzine  (ATARAX ) 25 MG tablet Take 1 tablet (25 mg total) by mouth 3 (three) times daily as needed. 90 tablet 0   irbesartan  (AVAPRO ) 300 MG tablet Take 1 tablet (300 mg total) by mouth daily. 90 tablet 3   metoprolol  succinate (TOPROL -XL) 50 MG 24 hr tablet Take 1 tablet (50 mg total) by mouth daily. Take with or immediately following a meal. 90 tablet 3   metroNIDAZOLE  (METROCREAM ) 0.75 % cream Apply to  nose and cheeks 1-2 times daily as needed. 45 g 5   Omega-3 1400 MG CAPS Take 2,800 mg by mouth daily.     potassium chloride  (KLOR-CON ) 10 MEQ tablet Take 1 tablet (10 mEq total) by mouth 2 (two) times daily. 180 tablet 3   traZODone  (DESYREL ) 50 MG tablet Take 1 tablet (50 mg total) by mouth at bedtime as needed for sleep. 90 tablet 3   triamcinolone  cream (KENALOG) 0.1 % Apply 1 Application topically 2 (two) times daily. (Patient not taking: Reported on 12/14/2023) 30 g 0   No current facility-administered medications for this visit.     Musculoskeletal: Strength & Muscle Tone: UTA Gait & Station: Seated Patient leans: N/A  Psychiatric Specialty Exam: Review of Systems  Psychiatric/Behavioral:  Positive for dysphoric mood. The patient is nervous/anxious.        Irritable    Last menstrual period 10/04/2022.There is no height or weight on file to calculate BMI.  General Appearance: Casual  Eye Contact:  Fair  Speech:  Clear and Coherent  Volume:  Normal  Mood:  Anxious, Depressed, and Irritable  Affect:  Appropriate  Thought Process:  Goal Directed and Descriptions of Associations: Intact  Orientation:  Full (Time, Place, and Person)  Thought Content: Logical   Suicidal Thoughts:  No  Homicidal Thoughts:  No  Memory:  Immediate;   Fair Recent;   Fair Remote;   Fair  Judgement:  Fair  Insight:  Fair  Psychomotor Activity:  Normal  Concentration:  Concentration: Fair and Attention Span: Fair  Recall:  Fair  [employer] of Knowledge: Fair  Language: Fair  Akathisia:  No  Handed:  Right  AIMS (if indicated): not done  Assets:  Communication Skills Desire for Improvement Housing Social Support  ADL's:  Intact  Cognition: WNL  Sleep:  improving   Screenings: AUDIT    Flowsheet Row [facility]  Alcohol Use Disorder Identification Test Final Score (AUDIT) 0   GAD-7    Flowsheet Row Office Visit  from 12/02/2023 in [facility] Office Visit from 03/24/2023 in [facility] Office Visit from 03/18/2023 in [facility] [place] Office Visit from 02/16/2023 in [facility] [place] Office Visit from 02/11/2023 in [facility]  Total GAD-7 Score 2 3 5 3 11    PHQ2-9    Flowsheet Row Office Visit from 12/02/2023 in [facility] Office Visit from 08[facility]18/2023 in [facility] Office Visit from 05/17/2023 in [facility] Office Visit from 03/24/2023 in [facility] Visit from 03/18/2023 in [facility]  PHQ-2 Total Score 1 0 0 2 2  PHQ-9 Total Score -- -- 2 10 6    Flowsheet Row Video Visit from 12/31/2023 in [facility] Office Visit from 12/02/2023 in [facility] Admission (Discharged) from 10/07/2023 in [place] [facility]  C-SSRS RISK CATEGORY No Risk No Risk No Risk     Assessment and Plan: Sandra Montes is a 53 year old Caucasian female, divorced, has a history of depression, PTSD, history of comorbid substance use in remission, was evaluated by telemedicine today.  Discussed assessment and plan as noted below.  Major depression likely mixed features-unstable Currently reports worsening mood symptoms complicated by comorbid pain.  Was doing well on the Depakote  however Depakote  had to be discontinued due to weight gain side effects.  Agreeable to trial of gabapentin . Start Gabapentin  300 mg at bedtime Continue Duloxetine  20 mg twice daily with plan to increase it in the future.  PTSD-stable Currently well managed on current medication regimen Continue  Trazodone  50 mg at bedtime as needed Continue Hydroxyzine  25 mg 3 times a day as needed Continue therapy appointment with Mr. YASMIN Tschupp.  Follow-up Follow-up in clinic in 4 weeks or sooner if needed.   Collaboration of Care: Collaboration of Care: Referral or follow-up with counselor/therapist AEB encouraged to continue psychotherapy sessions  Patient/Guardian was advised Release of Information must be obtained prior to any record release in order to collaborate their care with an outside provider. Patient/Guardian was advised if they have not already done so to contact the registration department to sign all necessary forms in order for us  to release information regarding their care.   Consent: Patient/Guardian gives verbal consent for treatment and assignment of benefits for services provided during this visit. Patient/Guardian expressed understanding and agreed to proceed.  This note was generated in part or whole with voice recognition software. Voice recognition is usually quite accurate but there are transcription errors that can and very often do occur. I apologize for any typographical errors that were not detected and corrected.     Cyrilla Durkin, MD 12/31/2023, 1:43 PM

## 2023-12-31 NOTE — Patient Instructions (Signed)
 Gabapentin  Capsules or Tablets What is this medication? GABAPENTIN  (GAB a PEN tin) treats nerve pain. It may also be used to prevent and control seizures in people with epilepsy. It works by calming overactive nerves in your body. This medicine may be used for other purposes; ask your health care provider or pharmacist if you have questions. COMMON BRAND NAME(S): Active-PAC with Gabapentin , Gabarone , Gralise , Neurontin  What should I tell my care team before I take this medication? They need to know if you have any of these conditions: Have or have had depression Kidney disease Lung or breathing disease, such as asthma or COPD Substance use disorder Suicidal thoughts, plans, or attempt An unusual or allergic reaction to gabapentin , other medications, foods, dyes, or preservatives Pregnant or trying to get pregnant Breastfeeding How should I use this medication? Take this medication by mouth with a glass of water. Follow the directions on the prescription label. You can take it with or without food. If it upsets your stomach, take it with food. Take your medication at regular intervals. Do not take it more often than directed. Do not stop taking except on your care team's advice. If you are directed to break the 600 or 800 mg tablets in half as part of your dose, the extra half tablet should be used for the next dose. If you have not used the extra half tablet within 28 days, it should be thrown away. A special MedGuide will be given to you by the pharmacist with each prescription and refill. Be sure to read this information carefully each time. Talk to your care team about the use of this medication in children. While this medication may be prescribed for children as young as 3 years for selected conditions, precautions do apply. Overdosage: If you think you have taken too much of this medicine contact a poison control center or emergency room at once. NOTE: This medicine is only for you. Do not  share this medicine with others. What if I miss a dose? If you miss a dose, take it as soon as you can. If it is almost time for your next dose, take only that dose. Do not take double or extra doses. What may interact with this medication? Alcohol Antihistamines for allergy , cough, and cold Certain medications for anxiety or sleep Certain medications for depression like amitriptyline, fluoxetine, sertraline Certain medications for seizures like phenobarbital, primidone Certain medications for stomach problems General anesthetics like halothane, isoflurane, methoxyflurane, propofol  Local anesthetics like lidocaine , pramoxine, tetracaine Medications that relax muscles for surgery Opioid medications for pain Phenothiazines like chlorpromazine, mesoridazine, prochlorperazine, thioridazine This list may not describe all possible interactions. Give your health care provider a list of all the medicines, herbs, non-prescription drugs, or dietary supplements you use. Also tell them if you smoke, drink alcohol, or use illegal drugs. Some items may interact with your medicine. What should I watch for while using this medication? Visit your care team for regular checks on your progress. Tell your care team if your symptoms do not start to get better or if they get worse. Do not suddenly stop taking this medication. You may develop a severe reaction. Your care team will tell you how much medication to take. If your care team wants you to stop the medication, the dose may be slowly lowered over time to avoid any side effects. This medication may affect your coordination, reaction time, or judgment. Do not drive or operate machinery until you know how this medication affects you. Sit  up or stand slowly to reduce the risk of dizzy or fainting spells. Drinking alcohol with this medication can increase the risk of these side effects. Taking this medication with other CNS depressants can make you too sleepy. This  can make it hard to breathe and stay awake. In some cases, it can cause coma and death. CNS depressants include opioids, benzodiazepines, muscle relaxants, medications for sleep, and alcohol. Talk to your care team about all the medications, vitamins, and supplements you take. They can tell you what is safe to take together. Call emergency services right away if you have slow or shallow breathing, feel dizzy or confused, or have trouble staying awake. This medication can cause new or worsening depression and increase the risk of suicidal thoughts and actions in a small number of people. This can happen while you are taking this medication or after stopping it. Talk to your care team right away if you have changes in mood or behavior or thoughts of self-harm or suicide. They can help you. This medication may cause serious skin reactions. They can happen weeks to months after starting the medication. Talk to your care team right away if you have fevers or flu-like symptoms with a rash. The rash may be red or purple and then turn into blisters or peeling of the skin. Or you might notice a red rash with swelling of the face, lips, or lymph nodes in your neck or under your arms. If you take this medication for seizures, wear a medical ID bracelet or chain. Carry a card that describes your condition. List the medications and doses you take on the card. Talk to your care team if you may be pregnant. There are benefits and risks to taking medications during pregnancy. Your care team can help you find the option that works for you. What side effects may I notice from receiving this medication? Side effects that you should report to your care team as soon as possible: Allergic reactions or angioedema--skin rash, itching or hives, swelling of the face, eyes, lips, tongue, arms, or legs, trouble swallowing or breathing Rash, fever, and swollen lymph nodes Thoughts of suicide or self-harm, worsening mood, feelings of  depression Trouble breathing Unusual changes in mood or behavior in children after use such as trouble concentrating, hostility, or restlessness Side effects that usually do not require medical attention (report these to your care team if they continue or are bothersome): Dizziness Drowsiness Nausea Swelling of the ankles, hands, or feet Vomiting This list may not describe all possible side effects. Call your doctor for medical advice about side effects. You may report side effects to FDA at 1-800-FDA-1088. Where should I keep my medication? Keep out of reach of children and pets. Store at room temperature between 15 and 30 degrees C (59 and 86 degrees F). Get rid of any unused medication after the expiration date. This medication may cause accidental overdose and death if taken by other adults, children, or pets. To get rid of medications that are no longer needed or have expired: Take the medication to a medication take-back program. Check with your pharmacy or law enforcement to find a location. If you cannot return the medication, check the label or package insert to see if the medication should be thrown out in the garbage or flushed down the toilet. If you are not sure, ask your care team. If it is safe to put it in the trash, empty the medication out of the container. Mix the medication  with cat litter, dirt, coffee grounds, or other unwanted substance. Seal the mixture in a bag or container. Put it in the trash. NOTE: This sheet is a summary. It may not cover all possible information. If you have questions about this medicine, talk to your doctor, pharmacist, or health care provider.  2025 Elsevier/Gold Standard (2023-09-06 00:00:00)

## 2024-01-11 ENCOUNTER — Ambulatory Visit: Admitting: Orthopedic Surgery

## 2024-01-13 ENCOUNTER — Telehealth: Payer: Self-pay | Admitting: *Deleted

## 2024-01-13 NOTE — Telephone Encounter (Signed)
   Pre-operative Risk Assessment    Patient Name: Sandra Montes  DOB: 1971-03-07 MRN: [id number]   Date of last office visit: 12/03/23 DR. TOLIA Date of next office visit: NONE   Request for Surgical Clearance    Procedure:  RIGHT TOTAL KNEE ARTHROPLASTY  Date of Surgery:  Clearance 03/06/24                                Surgeon:  DR. ABERMAN Surgeon's Group or Practice Name:  [facility] Phone number:  [phone] Fax number:  [phone]   Type of Clearance Requested:   - Medical  - Pharmacy:  Hold Aspirin      Type of Anesthesia:  Not Indicated (GENERAL?)   Additional requests/questions:    Bonney Niels Jest   01/13/2024, 11:04 AM

## 2024-01-13 NOTE — Telephone Encounter (Signed)
 Yes, please have her MPI done before providing risk stratification  Sandra Large, DO, FACC

## 2024-01-18 ENCOUNTER — Telehealth: Payer: Self-pay | Admitting: Family

## 2024-01-18 NOTE — Telephone Encounter (Signed)
 Surgical clearance form has been received for TKA. Per Ginger pt needs an office visit to complete form.

## 2024-01-18 NOTE — Telephone Encounter (Signed)
 lvm for pt to call office to schedule appt.

## 2024-01-18 NOTE — Telephone Encounter (Signed)
 Please help arrange the stress test sooner due to upcoming surgery.   [employer], DO, FACC

## 2024-01-20 NOTE — Telephone Encounter (Signed)
 lvm for pt to call office to schedule appt.

## 2024-01-25 ENCOUNTER — Ambulatory Visit (INDEPENDENT_AMBULATORY_CARE_PROVIDER_SITE_OTHER): Payer: Self-pay | Admitting: Family

## 2024-01-25 ENCOUNTER — Ambulatory Visit: Payer: Self-pay | Admitting: Family

## 2024-01-25 ENCOUNTER — Encounter: Payer: Self-pay | Admitting: Family

## 2024-01-25 VITALS — BP 124/86 | HR 86 | Temp 98.1°F | Ht 67.0 in | Wt 254.6 lb

## 2024-01-25 DIAGNOSIS — E785 Hyperlipidemia, unspecified: Secondary | ICD-10-CM

## 2024-01-25 DIAGNOSIS — R7303 Prediabetes: Secondary | ICD-10-CM

## 2024-01-25 DIAGNOSIS — E119 Type 2 diabetes mellitus without complications: Secondary | ICD-10-CM

## 2024-01-25 DIAGNOSIS — Z01818 Encounter for other preprocedural examination: Secondary | ICD-10-CM | POA: Diagnosis not present

## 2024-01-25 DIAGNOSIS — J432 Centrilobular emphysema: Secondary | ICD-10-CM

## 2024-01-25 DIAGNOSIS — I10 Essential (primary) hypertension: Secondary | ICD-10-CM | POA: Diagnosis not present

## 2024-01-25 DIAGNOSIS — R82998 Other abnormal findings in urine: Secondary | ICD-10-CM

## 2024-01-25 LAB — CBC
HCT: 39.5 % (ref 36.0–46.0)
Hemoglobin: 13.8 g/dL (ref 12.0–15.0)
MCHC: 34.9 g/dL (ref 30.0–36.0)
MCV: 86 fl (ref 78.0–100.0)
Platelets: 241 K/uL (ref 150.0–400.0)
RBC: 4.59 Mil/uL (ref 3.87–5.11)
RDW: 13.6 % (ref 11.5–15.5)
WBC: 7.3 K/uL (ref 4.0–10.5)

## 2024-01-25 LAB — COMPREHENSIVE METABOLIC PANEL WITH GFR
ALT: 29 U/L (ref 0–35)
AST: 27 U/L (ref 0–37)
Albumin: 4.2 g/dL (ref 3.5–5.2)
Alkaline Phosphatase: 62 U/L (ref 39–117)
BUN: 12 mg/dL (ref 6–23)
CO2: 35 meq/L — ABNORMAL HIGH (ref 19–32)
Calcium: 9.3 mg/dL (ref 8.4–10.5)
Chloride: 97 meq/L (ref 96–112)
Creatinine, Ser: 0.7 mg/dL (ref 0.40–1.20)
GFR: 98.5 mL/min (ref >60.00)
Glucose, Bld: 107 mg/dL — ABNORMAL HIGH (ref 70–99)
Potassium: 3.7 meq/L (ref 3.5–5.1)
Sodium: 138 meq/L (ref 135–145)
Total Bilirubin: 0.5 mg/dL (ref 0.2–1.2)
Total Protein: 7.1 g/dL (ref 6.0–8.3)

## 2024-01-25 LAB — PROTIME-INR
INR: 1 ratio (ref 0.8–1.0)
Prothrombin Time: 10.8 s (ref 9.6–13.1)

## 2024-01-25 LAB — HEMOGLOBIN A1C: Hgb A1c MFr Bld: 6.8 % — ABNORMAL HIGH (ref 4.6–6.5)

## 2024-01-25 MED ORDER — MOUNJARO 2.5 MG/0.5ML ~~LOC~~ SOAJ: 2.5000 mg | SUBCUTANEOUS | 1 refills | Status: DC

## 2024-01-25 NOTE — Telephone Encounter (Signed)
 Sandra Montes, can you help me with this?   Procedure date 03/06/2024

## 2024-01-25 NOTE — Progress Notes (Addendum)
 Assessment & Plan:   Assessment and Plan Assessment & Plan Left knee pain due to meniscus and ACL tear, scheduled for total knee arthroplasty Persistent left knee pain following a meniscus tear and ACL tear, not addressed during previous surgery. No improvement after 12 weeks of physical therapy. Scheduled for total knee arthroplasty on November 3rd. Surgery is intermediate risk due to orthopedic nature. - Ensure cardiac clearance prior to surgery -reviewed labs, pt cleared as an moderate risk pt with intermediate risk surgery from a primary care perspective. Pending cardiac clearance  - Monitor updates from cardiology regarding pre-operative stress testing - Ensure FMLA is in place for post-operative recovery -pending labs   Obesity with weight gain after Depakote  use Weight gain attributed to previous Depakote  use, menopause, and unspecified autoimmune disorder. Strong desire to lose weight but unsuccessful. Potential metabolic shift due to Depakote  discussed. - Consider weight management strategies post-surgery - Add A1c to lab work to assess for diabetes -The beneficiary does not have any FDA labeled contraindications to the requested agent including pregnancy, lactation, h/o medullary thyroid  cancer or multiple endocrine neoplasia type II.  -could consider sleep study as well for excessive daytime fatigue   Prediabetes Concerned about potential development of diabetes, especially with recent weight gain. A1c test planned to assess current status. - Order A1c to assess for diabetes  Chronic kidney disease stage 1 Kidney function stable one month ago. Under annual follow-up with [facility]. - Continue annual follow-up with nephrology - Order CMP to check kidney function  Hypertension Well-controlled on irbesartan  and metoprolol . - Continue current antihypertensive regimen  Hyperlipidemia On Zetia . Recent CT chest showed aortic atherosclerosis, indicating arterial  plaque. - Continue Zetia  for cholesterol management  Aortic atherosclerosis Aortic atherosclerosis noted on recent CT chest. On cholesterol medication. - Continue cholesterol management with Zetia   Mild aortic valve calcification and trivial aortic insufficiency Mild aortic valve calcification and trivial aortic insufficiency noted on echocardiogram. Being monitored. - Continue monitoring cardiac status -pending cardiology clearance, per notes needs stress test prior   Trivial mitral regurgitation Trivial mitral regurgitation noted on echocardiogram. Monitored as part of cardiac care. - Continue monitoring cardiac status  Chronic obstructive pulmonary disease (COPD) No breathing issues since quitting smoking. Recent CT chest showed benign findings. - Continue current management - No need for additional pulmonary testing at this time  Depression History of depression. Currently in therapy sessions. Still experiencing exhaustion and irritability due to pain and exhaustion. - Continue current therapy sessions  Autoimmune disorder, unspecified Under evaluation. No specific diagnosis made yet. - Continue monitoring and evaluation for autoimmune disorder  Recording duration: 17 minutes   Wt Readings from Last 3 Encounters:  01/25/24 254 lb 9.6 oz (115.5 kg)  12/03/23 256 lb (116.1 kg)  11/17/23 253 lb 12.8 oz (115.1 kg)   Temp Readings from Last 3 Encounters:  01/25/24 98.1 F (36.7 C) (Temporal)  10/07/23 (!) 97.3 F (36.3 C) (Axillary)  08/18/23 97.9 F (36.6 C) (Oral)   BP Readings from Last 3 Encounters:  01/25/24 124/86  12/03/23 131/83  11/17/23 113/76   Pulse Readings from Last 3 Encounters:  01/25/24 86  12/03/23 63  11/17/23 60   Height: 5' 7 (170.2 cm)     I have independently evaluated patient.  Sandra Montes is a 53 y.o. female who is average>4 risk for a intermediate risk surgery.  There are not modifiable risk factors (smoking,  etc). Sandra Montes's RCRI/NSQIP calculation for MACE is: 3 points Risk  of MACE of 0.9-1.7%..    Review meds: If indicated, NO ACE-I/ARB day of surgery. OK to do BB or statin day of (esp if vascular sx)  Return in about 3 months (around 04/25/2024) for f/u fatigue and weight.  [name], MSN, APRN, FNP-C [facility]  Subjective:      HPI: Pt is a 53 y.o. female who is here for preoperative clearance for right TKA with general anesthesia  Discussed the use of AI scribe software for clinical note transcription with the patient, who gave verbal consent to proceed.  History of Present Illness Sandra Montes is a 53 year old female who presents for preoperative evaluation for a total knee arthroplasty.  She is scheduled for a total knee arthroplasty on November 3rd due to ongoing knee pain and dysfunction. She has a history of a meniscus tear and subsequent surgery during which her ACL was torn. Despite 12 weeks of physical therapy, her pain has worsened, prompting a second opinion and the decision for a full knee replacement.  She has experienced significant weight gain, which she attributes to her previous use of Depakote . Despite discontinuing Depakote  over a month ago, she has not lost any weight. She is frustrated with her weight and its impact on her energy levels and overall well-being. She has prediabetes, given her recent weight gain and family history.  Her current medications include gabapentin  300 mg at night, irbesartan , hydroxyzine  as needed, Zyrtec, B complex, metoprolol  50 mg once a day, calcium , vitamin D, Zetia  for cholesterol, potassium chloride , aspirin , trazodone , duloxetine , and hydrochlorothiazide . She also takes turmeric and glucosamine chondroitin for joint health.  She has a history of COPD, which has been well-managed since she quit smoking. No recent breathing issues. Her last CT chest in August 2025 was performed.  She is  under the care of a cardiologist and nephrologist. Her kidney function is stable, with a diagnosis of CKD stage 1, and she is monitored annually. Her last cardiac evaluation included an EKG in August 2025, which showed normal sinus rhythm with a nonspecific T wave abnormality.  She feels exhausted and irritable due to chronic pain and lack of sleep. She continues to see a therapist weekly, which she finds helpful for her mental health.  She has a history of autoimmune issues, though a specific diagnosis has not been determined.   1) High Risk Cardiac Conditions:  1) Recent MI - No.  2) Decompensated Heart Failure - No.  3) Unstable angina - No.  4) Symptomatic arrythmia - No.  5) Sx Valvular Disease - No.  2) Intermediate Risk Factors: DM, CKD, CVA, CHF, CAD - Yes.   Being seen by nephrology for CKD annual following, kidney function stable   2) Functional Status: > 4 mets (Walk, run, climb stairs) Yes.  SABRA Hails Activity Status Index: >4  3) Surgery Specific Risk:           Intermediate          4) Further Noninvasive evaluation:   1) EKG - No. Requires cardiology clearance last EKG 12/03/23 NSR HR 60 bpm non specific t wave abn in V1   Hx of MI, CVA, CAD, DM, CKD  2) Echo - No. Echo completed in 05/26/23   Worsening dyspnea or CHF without an echo in the past year  3) Stress Testing - Active Cardiac Disease - Yes.    4) CXR No.   If asymptomatic, healthy, no respiratory symptoms it is not  needed  5)PFTs No.   OSA, OHS, or significant cardiopulmonary history  5) Need for medical therapy - Beta Blocker, Statins indicated ? No.      Social history:  Relevant past medical, surgical, family and social history reviewed and updated as indicated. Interim medical history since our last visit reviewed.  Allergies and medications reviewed and updated.  DATA REVIEWED: CHART IN EPIC  ROS: Negative unless specifically indicated above in HPI.    Current Outpatient Medications:     acetaminophen  (TYLENOL ) 650 MG CR tablet, Take 650 mg by mouth every 8 (eight) hours as needed for pain., Disp: , Rfl:    albuterol  (VENTOLIN  HFA) 108 (90 Base) MCG/ACT inhaler, INHALE 2 PUFFS INTO THE LUNGS EVERY 4 HOURS AS NEEDED FOR WHEEZING OR SHORTNESS OF BREATH, Disp: 8.5 g, Rfl: 0   aspirin  EC 81 MG tablet, Take 81 mg by mouth daily. Swallow whole., Disp: , Rfl:    atorvastatin  (LIPITOR) 40 MG tablet, Take 1 tablet (40 mg total) by mouth daily., Disp: 90 tablet, Rfl: 3   B Complex-C (B-COMPLEX WITH VITAMIN C) tablet, Take 1 tablet by mouth in the morning., Disp: , Rfl:    Calcium  Citrate-Vitamin D (CITRACAL PETITES/VITAMIN D PO), Take 1 tablet by mouth in the morning and at bedtime., Disp: , Rfl:    cetirizine (ZYRTEC) 10 MG tablet, Take 10 mg by mouth daily., Disp: , Rfl:    DULoxetine  (CYMBALTA ) 20 MG capsule, Take 1 capsule (20 mg total) by mouth 2 (two) times daily., Disp: 180 capsule, Rfl: 1   ezetimibe  (ZETIA ) 10 MG tablet, Take 1 tablet (10 mg total) by mouth daily., Disp: 90 tablet, Rfl: 3   gabapentin  (NEURONTIN ) 300 MG capsule, Take 1 capsule (300 mg total) by mouth at bedtime., Disp: 90 capsule, Rfl: 0   hydrochlorothiazide  (HYDRODIURIL ) 25 MG tablet, Take 1 tablet (25 mg total) by mouth daily., Disp: 90 tablet, Rfl: 3   hydrOXYzine  (ATARAX ) 25 MG tablet, Take 1 tablet (25 mg total) by mouth 3 (three) times daily as needed., Disp: 90 tablet, Rfl: 0   irbesartan  (AVAPRO ) 300 MG tablet, Take 1 tablet (300 mg total) by mouth daily., Disp: 90 tablet, Rfl: 3   metoprolol  succinate (TOPROL -XL) 50 MG 24 hr tablet, Take 1 tablet (50 mg total) by mouth daily. Take with or immediately following a meal., Disp: 90 tablet, Rfl: 3   metroNIDAZOLE  (METROCREAM ) 0.75 % cream, Apply to nose and cheeks 1-2 times daily as needed., Disp: 45 g, Rfl: 5   Omega-3 1400 MG CAPS, Take 2,800 mg by mouth daily., Disp: , Rfl:    potassium chloride  (KLOR-CON ) 10 MEQ tablet, Take 1 tablet (10 mEq total) by mouth 2  (two) times daily., Disp: 180 tablet, Rfl: 3   traZODone  (DESYREL ) 50 MG tablet, Take 1 tablet (50 mg total) by mouth at bedtime as needed for sleep., Disp: 90 tablet, Rfl: 3      Objective:    BP 124/86 (BP Location: Left Arm, Patient Position: Sitting, Cuff Size: Large)   Pulse 86   Temp 98.1 F (36.7 C) (Temporal)   Ht 5' 7 (1.702 m)   Wt 254 lb 9.6 oz (115.5 kg)   LMP 10/04/2022 (Approximate) Comment: has period off/on  SpO2 95%   BMI 39.88 kg/m   Wt Readings from Last 3 Encounters:  01/25/24 254 lb 9.6 oz (115.5 kg)  12/03/23 256 lb (116.1 kg)  11/17/23 253 lb 12.8 oz (115.1 kg)    Physical Exam  HEENT: No cerumen in ears. Physical Exam Vitals reviewed.  Constitutional:      General: She is not in acute distress.    Appearance: Normal appearance. She is obese. She is not ill-appearing.  HENT:     Head: Normocephalic.     Right Ear: Tympanic membrane normal.     Left Ear: Tympanic membrane normal.     Nose: Nose normal.     Mouth/Throat:     Mouth: Mucous membranes are moist.     Dentition: Has dentures (upper).  Eyes:     Extraocular Movements: Extraocular movements intact.     Pupils: Pupils are equal, round, and reactive to light.  Cardiovascular:     Rate and Rhythm: Normal rate and regular rhythm.  Pulmonary:     Effort: Pulmonary effort is normal.     Breath sounds: Normal breath sounds.  Abdominal:     General: Abdomen is flat. Bowel sounds are normal.     Palpations: Abdomen is soft.     Tenderness: There is no guarding or rebound.  Musculoskeletal:        General: Normal range of motion.     Cervical back: Normal range of motion.  Skin:    General: Skin is warm.     Capillary Refill: Capillary refill takes less than 2 seconds.  Neurological:     General: No focal deficit present.     Mental Status: She is alert.  Psychiatric:        Mood and Affect: Mood normal.        Behavior: Behavior normal.        Thought Content: Thought content normal.         Judgment: Judgment normal.    Lab Results  Component Value Date   HGBA1C 6.1 08/18/2023         Results LABS Microalbumin Creatinine Ratio: 124 (08/18/2023) Thyroid  Panel: Performed by rheumatology, all four components checked (11/2023)  RADIOLOGY CT Chest: Benign appearance, aortic atherosclerosis, scattered calcified granulomata, resolved tiny right upper lobe pulmonary nodule, no suspicious pulmonary nodular mass (12/07/2023)  DIAGNOSTIC Echocardiogram: Normal left ventricular systolic function, ejection fraction 55-60%, grade 1 diastolic dysfunction, normal longitudinal strain, trivial mitral regurgitation, mild calcification of the aortic valve, trivial aortic insufficiency (05/26/2023) EKG: Normal sinus rhythm, heart rate 60 bpm, nonspecific T wave abnormality in V1 (12/03/2023)

## 2024-01-26 ENCOUNTER — Encounter (HOSPITAL_COMMUNITY): Payer: Self-pay | Admitting: Cardiology

## 2024-01-26 LAB — URINALYSIS W MICROSCOPIC + REFLEX CULTURE
Bacteria, UA: NONE SEEN /HPF
Bilirubin Urine: NEGATIVE
Glucose, UA: NEGATIVE
Hgb urine dipstick: NEGATIVE
Hyaline Cast: NONE SEEN /LPF
Ketones, ur: NEGATIVE
Leukocyte Esterase: NEGATIVE
Nitrites, Initial: NEGATIVE
RBC / HPF: NONE SEEN /HPF (ref 0–2)
Specific Gravity, Urine: 1.012 (ref 1.001–1.035)
WBC, UA: NONE SEEN /HPF (ref 0–5)
pH: 7.5 (ref 5.0–8.0)

## 2024-01-26 LAB — NO CULTURE INDICATED

## 2024-01-27 ENCOUNTER — Telehealth (HOSPITAL_COMMUNITY): Payer: Self-pay | Admitting: *Deleted

## 2024-01-27 ENCOUNTER — Telehealth: Payer: Self-pay

## 2024-01-27 ENCOUNTER — Other Ambulatory Visit (HOSPITAL_COMMUNITY): Payer: Self-pay

## 2024-01-27 NOTE — Telephone Encounter (Signed)
 Called and spoke with patient. She stated that someone already called her and she is scheduled for 02/02/2024.

## 2024-01-27 NOTE — Telephone Encounter (Signed)
 Patient given detailed instructions per Myocardial Perfusion Study Information Sheet for the test on 02/02/2024 at 7:45. Patient notified to arrive 15 minutes early and that it is imperative to arrive on time for appointment to keep from having the test rescheduled.  If you need to cancel or reschedule your appointment, please call the office within 24 hours of your appointment. . Patient verbalized understanding.Sandra Montes

## 2024-01-27 NOTE — Telephone Encounter (Signed)
 Pharmacy Patient Advocate Encounter  Received notification from [employer] that Prior Authorization for Mounjaro  2.5 has been APPROVED from 01/27/24 to 01/26/25. Ran test claim, Copay is $0.00. This test claim was processed through [facility]- copay amounts may vary at other pharmacies due to pharmacy/plan contracts, or as the patient moves through the different stages of their insurance plan.   PA #/Case ID/Reference #: # [id number]

## 2024-01-27 NOTE — Telephone Encounter (Signed)
 Pharmacy Patient Advocate Encounter   Received notification from [employer]base that prior authorization for Mounjaro  2.5 is required/requested.   Insurance verification completed.   The patient is insured through [facility] .   Per test claim: PA required; PA submitted to above mentioned insurance via Latent Key/confirmation #/EOC [id number] Status is pending

## 2024-01-28 ENCOUNTER — Telehealth (INDEPENDENT_AMBULATORY_CARE_PROVIDER_SITE_OTHER): Admitting: Psychiatry

## 2024-01-28 ENCOUNTER — Encounter: Payer: Self-pay | Admitting: Psychiatry

## 2024-01-28 ENCOUNTER — Other Ambulatory Visit: Payer: Self-pay | Admitting: Cardiology

## 2024-01-28 DIAGNOSIS — F331 Major depressive disorder, recurrent, moderate: Secondary | ICD-10-CM

## 2024-01-28 DIAGNOSIS — I2510 Atherosclerotic heart disease of native coronary artery without angina pectoris: Secondary | ICD-10-CM

## 2024-01-28 DIAGNOSIS — F4310 Post-traumatic stress disorder, unspecified: Secondary | ICD-10-CM

## 2024-01-28 MED ORDER — GABAPENTIN 300 MG PO CAPS: 300.0000 mg | ORAL_CAPSULE | Freq: Two times a day (BID) | ORAL | Status: DC

## 2024-01-28 MED ORDER — TRAZODONE HCL 50 MG PO TABS: 50.0000 mg | ORAL_TABLET | Freq: Every evening | ORAL | 1 refills | Status: DC | PRN

## 2024-01-28 NOTE — Progress Notes (Signed)
 Virtual Visit via Video Note  I connected with Sandra Montes on 01/28/24 at 10:20 AM EDT by a video enabled telemedicine application and verified that I am speaking with the correct person using two identifiers.  Location Provider Location : [facility] Patient Location : Work  Participants: Patient , Provider   I discussed the limitations of evaluation and management by telemedicine and the availability of in person appointments. The patient expressed understanding and agreed to proceed.    I discussed the assessment and treatment plan with the patient. The patient was provided an opportunity to ask questions and all were answered. The patient agreed with the plan and demonstrated an understanding of the instructions.   The patient was advised to call back or seek an in-person evaluation if the symptoms worsen or if the condition fails to improve as anticipated.    BH MD OP Progress Note  01/28/2024 10:59 AM Sandra Montes  MRN:  Montes  Chief Complaint:  Chief Complaint  Patient presents with   Follow-up   Depression   Anxiety   Medication Refill   Discussed the use of AI scribe software for clinical note transcription with the patient, who gave verbal consent to proceed.  History of Present Illness Sandra Montes is a 53 year old Caucasian female, divorced, lives with her partner, currently employed, lives in [place], has a history of MDD, PTSD, history of alcohol and cannabis abuse in remission, emphysema, hypertension, asthma, osteoarthritis, recent diagnosis of diabetes was evaluated by telemedicine today.  Significant irritability and mood lability have affected her daily life, with her describing her mood as very dark and eruptive. She notes being very snappy, especially with her significant other and at work, and often needs to restrain herself from reacting to minor issues. Recent stressors, including a new diagnosis of diabetes, ongoing knee pain,  and broader psychosocial concerns such as worries about the state of the world, contribute to her increased irritability and anger. She identifies her recent diabetes diagnosis as a particular source of frustration and anger, noting her A1c increased from 5.5 last year to 6.8 this year. Pain and poor sleep related to her knee condition further impact her mood, and she is awaiting knee replacement surgery scheduled for November 3rd. Ongoing psychotherapy continues. She takes gabapentin  at bedtime, which she recently started at a low dose after discontinuing Depakote , but she has not noticed any benefit for pain or sleep. Trazodone , typically taken between 7:30 and 8:30 PM, supports her sleep. Persistent pain interferes with her sleep and daily functioning. She denies any thoughts of harming herself or others.     Visit Diagnosis:    ICD-10-CM   1. MDD (major depressive disorder), recurrent episode, moderate (HCC)  F33.1 traZODone  (DESYREL ) 50 MG tablet   Likely with mixed features    2. PTSD (post-traumatic stress disorder)  F43.10 gabapentin  (NEURONTIN ) 300 MG capsule      Past Psychiatric History: I have reviewed past psychiatric history from progress note on 03/24/2023.  Past trials of medications like Pristiq, Wellbutrin, Depakote -weight gain, Paxil, Prozac  Past Medical History:  Past Medical History:  Diagnosis Date   Adnexal mass 05/17/2022   a.) CT A/P 05/18/2023: 17.0 x 16.3 x 12.8 cm --> favored to be a large benign fluid filled ovarian cyst --> surgical resection recommended/pending   Anxiety    Aortic atherosclerosis    Bartholin's gland abscess    CAD (coronary artery disease) 06/04/2023   a.) cCTA 06/04/2023: Ca2+ = 450 (99th %'ile) -->  27.6 LAD, 66.1 LCx, 356 RCA   Chronic left shoulder pain    Depression    Diastolic dysfunction 05/26/2023   a.) TTE 05/26/2023: EF 55-60%, no RWMAs, G1DD, triv AR/MR, mild AoC sclerosis without stenosis   DOE (dyspnea on exertion)     Emphysema lung (HCC)    History of 2019 novel coronavirus disease (COVID-19) 12/13/2020   History of alcohol abuse    History of allergy     History of cannabis abuse    History of suicidal ideation 09/14/2022   a.) scared to do it; increasing intrusive thoughts about jumping off a cliff because she is tired of fighting; (+) crying, anxiety, depression, inturrupted sleep, appetite changes related to relationship problems (broke up with BF) and what sounds like caregiver role strain (caregiver to very demanding mother); (+) polysubstance use/abuse   HLD (hyperlipidemia)    Hyperlipidemia    Hypertension    Insomnia    a.) uses trazodone  PRN   Irritable bowel syndrome with diarrhea    Nephrolithiasis    Pain in both hands    Polyarthralgia    Positive ANA (antinuclear antibody)    PTSD (post-traumatic stress disorder)    RBBB (right bundle branch block)    Sinus bradycardia    Urinary incontinence    Weight gain due to medication    a.) significant (40lb) weight gain with the initiation of divalproex     Past Surgical History:  Procedure Laterality Date   ABDOMINAL HYSTERECTOMY  07/08/2023   CESAREAN SECTION N/A 1993   CHOLECYSTECTOMY N/A 1996   COLONOSCOPY WITH PROPOFOL  N/A 11/18/2021   Procedure: COLONOSCOPY WITH PROPOFOL ;  Surgeon: Unk Corinn Skiff, MD;  Location: [facility];  Service: Gastroenterology;  Laterality: N/A;   KNEE ARTHROSCOPY WITH MEDIAL MENISECTOMY Right 10/07/2023   Procedure: ARTHROSCOPY, KNEE, WITH MEDIAL MENISCECTOMY;  Surgeon: Onesimo Oneil LABOR, MD;  Location: [facility];  Service: Orthopedics;  Laterality: Right;   ROBOTIC ASSISTED BILATERAL SALPINGO OOPHERECTOMY Bilateral 07/08/2023   Procedure: XI ROBOTIC ASSISTED BILATERAL SALPINGO OOPHORECTOMY, REMOVAL OF ADNEXAL MASS;  Surgeon: Verdon Keen, MD;  Location: [facility];  Service: Gynecology;  Laterality: Bilateral;   TUBAL LIGATION Bilateral 1995    Family Psychiatric History: I have reviewed family  psychiatric history from progress note on 03/24/2023.  Family History:  Family History  Problem Relation Age of Onset   Depression Mother    Alcohol abuse Mother    Heart attack Mother 69   Skin cancer Father    Alcohol abuse Father    Prostate cancer Father    Rheum arthritis Paternal Grandmother    Prostate cancer Paternal Grandfather    Skin cancer Paternal Grandfather    Asthma Daughter    Mental illness Daughter        borderline   Mitral valve prolapse Daughter        & enlarged aorta   Migraines Daughter    Alcohol abuse Cousin    Healthy Son    Healthy Son     Social History: I have reviewed social history from progress note on 03/24/2023. Social History   Socioeconomic History   Marital status: Divorced    Spouse name: Not on file   Number of children: 3   Years of education: 12   Highest education level: Associate degree: occupational, Scientist, product/process development, or vocational program  Occupational History    Employer: [employer]    Comment: mortgage insurance  Tobacco Use   Smoking status: Former    Current packs/day: 0.00  Average packs/day: 1.7 packs/day for 57.0 years (95.5 ttl pk-yrs)    Types: Cigarettes    Start date: 2004    Quit date: 2024    Years since quitting: 1.7    Passive exposure: Current   Smokeless tobacco: Never   Tobacco comments:    Pt smokes two packs cigarettes for the past 20 yrs, wants 14 mg patch  Vaping Use   Vaping status: Never Used  Substance and Sexual Activity   Alcohol use: Not Currently    Comment: h/o alcoholism quit 2017   Drug use: Yes    Types: Marijuana    Comment: occ   Sexual activity: Not Currently    Partners: Male    Birth control/protection: None  Other Topics Concern   Not on file  Social History Narrative   ** Merged History Encounter **       Active smoker- lives in [place]; with mother- care giver; with quality/control- mortgage.    Social Drivers of Corporate investment banker Strain: Low  Risk  (01/07/2024)   [facility]   Overall Financial Resource Strain (CARDIA)    Difficulty of Paying Living Expenses: Not very hard  Food Insecurity: No Food Insecurity (01/07/2024)   [facility] from [facility]   Hunger Vital Sign    Within the past 12 months, you worried that your food would run out before you got the money to buy more.: Never true    Within the past 12 months, the food you bought just didn't last and you didn't have money to get more.: Never true  Transportation Needs: No Transportation Needs (01/07/2024)   Received from [facility] - Transportation    In the past 12 months, has lack of transportation kept you from medical appointments or from getting medications?: No    Lack of Transportation (Non-Medical): No  Physical Activity: Unknown (05/14/2023)   Exercise Vital Sign    Days of Exercise per Week: 0 days    Minutes of Exercise per Session: Not on file  Stress: No Stress Concern Present (05/14/2023)   [employer] of Occupational Health - Occupational Stress Questionnaire    Feeling of Stress : Only a little  Social Connections: Socially Isolated (05/14/2023)   Social Connection and Isolation Panel    Frequency of Communication with Friends and Family: More than three times a week    Frequency of Social Gatherings with Friends and Family: Once a week    Attends Religious Services: Never    Database administrator or Organizations: No    Attends Engineer, structural: Not on file    Marital Status: Divorced    Allergies:  Allergies  Allergen Reactions   Buspar [Buspirone] Other (See Comments)    Optical migraines   Lisinopril Cough   Morphine Itching and Other (See Comments)    tried to take her face off due to itching   Norvasc [Amlodipine] Swelling   Sulfa Antibiotics Other (See Comments)    Passes out    Metabolic Disorder Labs: Lab Results  Component Value Date    HGBA1C 6.8 (H) 01/25/2024   MPG 111 07/04/2021   Lab Results  Component Value Date   PROLACTIN 4.3 01/13/2022   Lab Results  Component Value Date   CHOL 138 12/03/2023   TRIG 89 12/03/2023   HDL 55 12/03/2023   CHOLHDL 2.5 12/03/2023   VLDL 87.7 10/20/2022   LDLCALC 66  12/03/2023   LDLCALC 84 10/20/2022   Lab Results  Component Value Date   TSH 2.52 11/17/2023   TSH 4.19 08/18/2023    Therapeutic Level Labs: No results found for: LITHIUM Lab Results  Component Value Date   VALPROATE 54.0 05/17/2023   VALPROATE 64.6 01/01/2023   No results found for: CBMZ  Current Medications: Current Outpatient Medications  Medication Sig Dispense Refill   acetaminophen  (TYLENOL ) 650 MG CR tablet Take 650 mg by mouth every 8 (eight) hours as needed for pain.     albuterol  (VENTOLIN  HFA) 108 (90 Base) MCG/ACT inhaler INHALE 2 PUFFS INTO THE LUNGS EVERY 4 HOURS AS NEEDED FOR WHEEZING OR SHORTNESS OF BREATH 8.5 g 0   aspirin  EC 81 MG tablet Take 81 mg by mouth daily. Swallow whole.     atorvastatin  (LIPITOR) 40 MG tablet Take 1 tablet (40 mg total) by mouth daily. 90 tablet 3   B Complex-C (B-COMPLEX WITH VITAMIN C) tablet Take 1 tablet by mouth in the morning.     Calcium  Citrate-Vitamin D (CITRACAL PETITES/VITAMIN D PO) Take 1 tablet by mouth in the morning and at bedtime.     cetirizine (ZYRTEC) 10 MG tablet Take 10 mg by mouth daily.     DULoxetine  (CYMBALTA ) 20 MG capsule Take 1 capsule (20 mg total) by mouth 2 (two) times daily. 180 capsule 1   ezetimibe  (ZETIA ) 10 MG tablet Take 1 tablet (10 mg total) by mouth daily. 90 tablet 3   gabapentin  (NEURONTIN ) 300 MG capsule Take 1 capsule (300 mg total) by mouth 2 (two) times daily.     hydrochlorothiazide  (HYDRODIURIL ) 25 MG tablet Take 1 tablet (25 mg total) by mouth daily. 90 tablet 3   hydrOXYzine  (ATARAX ) 25 MG tablet Take 1 tablet (25 mg total) by mouth 3 (three) times daily as needed. 90 tablet 0   irbesartan  (AVAPRO ) 300 MG  tablet Take 1 tablet (300 mg total) by mouth daily. 90 tablet 3   metoprolol  succinate (TOPROL -XL) 50 MG 24 hr tablet Take 1 tablet (50 mg total) by mouth daily. Take with or immediately following a meal. 90 tablet 3   metroNIDAZOLE  (METROCREAM ) 0.75 % cream Apply to nose and cheeks 1-2 times daily as needed. 45 g 5   Omega-3 1400 MG CAPS Take 2,800 mg by mouth daily.     potassium chloride  (KLOR-CON ) 10 MEQ tablet Take 1 tablet (10 mEq total) by mouth 2 (two) times daily. 180 tablet 3   tirzepatide  (MOUNJARO ) 2.5 MG/0.5ML Pen Inject 2.5 mg into the skin once a week. for diabetes. 2 mL 1   traZODone  (DESYREL ) 50 MG tablet Take 1-2 tablets (50-100 mg total) by mouth at bedtime as needed for sleep. 180 tablet 1   No current facility-administered medications for this visit.     Musculoskeletal: Strength & Muscle Tone: UTA Gait & Station: Seated Patient leans: N/A  Psychiatric Specialty Exam: Review of Systems  Psychiatric/Behavioral:  Positive for dysphoric mood and sleep disturbance. The patient is nervous/anxious.     Last menstrual period 10/04/2022.There is no height or weight on file to calculate BMI.  General Appearance: Casual  Eye Contact:  Fair  Speech:  Clear and Coherent  Volume:  Normal  Mood:  Anxious, Depressed, and irritable  Affect:  Congruent  Thought Process:  Goal Directed and Descriptions of Associations: Intact  Orientation:  Full (Time, Place, and Person)  Thought Content: Logical   Suicidal Thoughts:  No  Homicidal Thoughts:  No  Memory:  Immediate;   Fair Recent;   Fair Remote;   Fair  Judgement:  Fair  Insight:  Fair  Psychomotor Activity:  Normal  Concentration:  Concentration: Fair and Attention Span: Fair  Recall:  [employer] of Knowledge: Fair  Language: Fair  Akathisia:  No  Handed:  Right  AIMS (if indicated): not done  Assets:  Communication Skills Desire for Improvement Housing Social Support  ADL's:  Intact  Cognition: WNL  Sleep:   Poor   Screenings: AUDIT    Flowsheet Row [facility]  Alcohol Use Disorder Identification Test Final Score (AUDIT) 0   GAD-7    Flowsheet Row Office Visit from 12/02/2023 in [facility] Office Visit from 03/24/2023 in [facility] Office Visit from 03/18/2023 in [facility] Office Visit from 02[facility]16/2023 in [facility] Office Visit from 02/11/2023 in [facility]  Total GAD-7 Score 2 3 5 3 11    PHQ2-9    Flowsheet Row Office Visit from 12/02/2023 in [facility] Office Visit from 08/18/2023 in [facility] Office Visit from 05/17/2023 in [facility] Office Visit from 03/24/2023 in [facility] Office Visit from 03/18/2023 in [facility]  PHQ-2 Total Score 1 0 0 2 2  PHQ-9 Total Score -- -- 2 10 6    Flowsheet Row Video Visit from 01/28/2024 in [facility] Video Visit from 12/31/2023 in [facility] Office Visit from 12/02/2023 in [facility]  C-SSRS RISK CATEGORY No Risk No Risk No Risk     Assessment and Plan: Sandra Montes is a 53 year old Caucasian female, has a history of depression, PTSD was evaluated by telemedicine today.  Discussed assessment and plan as noted below.  1. MDD (major depressive disorder), recurrent episode, moderate (HCC)-unstable Currently reports ongoing depression, irritability, anxiety mostly complicated by recent diagnosis of diabetes as well as pain. Increase Gabapentin  to 300 mg twice daily Continue  Duloxetine  20 mg twice daily.   Encouraged to continue psychotherapy sessions with Mr.A.J Tschupp.  2. PTSD (post-traumatic stress disorder)-stable Denies any significant intrusive memories flashbacks nightmares.  Sleep however is affected due to pain. Increase Trazodone  to 50 to 100 mg at bedtime as needed Continue Hydroxyzine  25 mg 3 times a day as needed Continue psychotherapy sessions.  Follow-up Follow-up in clinic in 2 weeks or sooner if needed.     Collaboration of Care: Collaboration of Care: Referral or follow-up with counselor/therapist AEB encouraged to continue psychotherapy sessions.  Patient/Guardian was advised Release of Information must be obtained prior to any record release in order to collaborate their care with an outside provider. Patient/Guardian was advised if they have not already done so to contact the registration department to sign all necessary forms in order for us  to release information regarding their care.   Consent: Patient/Guardian gives verbal consent for treatment and assignment of benefits for services provided during this visit. Patient/Guardian expressed understanding and agreed to proceed.  This note was generated in part or whole with voice recognition software. Voice recognition is usually quite accurate but there are transcription errors that can and very often do occur. I apologize for any  typographical errors that were not detected and corrected.     Chea Malan, MD 01/30/2024, 6:48 AM

## 2024-01-29 ENCOUNTER — Encounter (INDEPENDENT_AMBULATORY_CARE_PROVIDER_SITE_OTHER): Payer: Self-pay

## 2024-02-02 ENCOUNTER — Ambulatory Visit (HOSPITAL_COMMUNITY)
Admission: RE | Admit: 2024-02-02 | Discharge: 2024-02-02 | Disposition: A | Discharge status: Home / Self Care | Payer: Self-pay | Source: Ambulatory Visit | Attending: Cardiology | Admitting: Cardiology

## 2024-02-02 DIAGNOSIS — I2584 Coronary atherosclerosis due to calcified coronary lesion: Secondary | ICD-10-CM | POA: Insufficient documentation

## 2024-02-02 DIAGNOSIS — I2510 Atherosclerotic heart disease of native coronary artery without angina pectoris: Secondary | ICD-10-CM | POA: Diagnosis not present

## 2024-02-02 LAB — MYOCARDIAL PERFUSION IMAGING
Base ST Depression (mm): 0 mm
LV dias vol: 106 mL (ref 46–106)
LV sys vol: 28 mL (ref 3.8–5.2)
Nuc Stress EF: 74 %
Peak HR: 90 {beats}/min
Rest HR: 72 {beats}/min
Rest Nuclear Isotope Dose: 12.4 mCi
SDS: 2
SRS: 0
SSS: 2
ST Depression (mm): 0 mm
Stress Nuclear Isotope Dose: 36.8 mCi
TID: 1.11

## 2024-02-02 MED ORDER — REGADENOSON 0.4 MG/5ML IV SOLN
0.4000 mg | Freq: Once | INTRAVENOUS | Status: AC
  Administered 2024-02-02: 0.4 mg via INTRAVENOUS

## 2024-02-02 MED ORDER — TECHNETIUM TC 99M TETROFOSMIN IV KIT
12.4000 | PACK | Freq: Once | INTRAVENOUS | Status: AC | PRN
  Administered 2024-02-02: 12.4 via INTRAVENOUS

## 2024-02-02 MED ORDER — REGADENOSON 0.4 MG/5ML IV SOLN
INTRAVENOUS | Status: AC
  Filled 2024-02-02: qty 5

## 2024-02-02 MED ORDER — TECHNETIUM TC 99M TETROFOSMIN IV KIT
36.8000 | PACK | Freq: Once | INTRAVENOUS | Status: AC | PRN
  Administered 2024-02-02: 36.8 via INTRAVENOUS

## 2024-02-02 NOTE — Telephone Encounter (Signed)
 Pending stress test results.

## 2024-02-02 NOTE — Telephone Encounter (Signed)
 Acceptable risk for right knee surgery.  May hold ASA for 7 days before the surgery and restart when cleared by her orthopedic provider.   Kambrea Carrasco Amo, DO, FACC

## 2024-02-02 NOTE — Telephone Encounter (Signed)
 Dr. Michele Nuclear stress test appears low risk. Any other testing needed prior to surgery?

## 2024-02-02 NOTE — Telephone Encounter (Signed)
 Will update the requesting office the pt has stress test to be done before she can be cleared.

## 2024-02-03 ENCOUNTER — Encounter: Payer: Self-pay | Admitting: Family

## 2024-02-03 DIAGNOSIS — I10 Essential (primary) hypertension: Secondary | ICD-10-CM

## 2024-02-03 MED ORDER — IRBESARTAN 300 MG PO TABS: 300.0000 mg | ORAL_TABLET | Freq: Every day | ORAL | 3 refills | Status: AC

## 2024-02-03 MED ORDER — METOPROLOL SUCCINATE ER 50 MG PO TB24
50.0000 mg | ORAL_TABLET | Freq: Every day | ORAL | 3 refills | Status: AC
Start: 2024-02-03 — End: ?

## 2024-02-03 MED ORDER — HYDROCHLOROTHIAZIDE 25 MG PO TABS: 25.0000 mg | ORAL_TABLET | Freq: Every day | ORAL | 3 refills | Status: DC

## 2024-02-03 NOTE — Telephone Encounter (Signed)
   Name: Sandra Montes  DOB: 1971-03-16  MRN: [id number]   Primary Cardiologist: Madonna Large, DO  Chart reviewed as part of pre-operative protocol coverage.   Pt recently underwent nuclear stress test that was low risk, no signs of ischemia.   Per Dr. Large: Acceptable risk for right knee surgery.  May hold ASA for 7 days before the surgery and restart when cleared by her orthopedic provider.    Therefore, based on ACC/AHA guidelines, the patient would be at acceptable risk for the planned procedure without further cardiovascular testing.    I will route this recommendation to the requesting party via Epic fax function and remove from pre-op pool. Please call with questions.  Jon Garre Daniela Hernan, PA 02/03/2024, 6:55 AM

## 2024-02-09 ENCOUNTER — Other Ambulatory Visit: Payer: Self-pay | Admitting: Orthopedic Surgery

## 2024-02-09 ENCOUNTER — Ambulatory Visit: Payer: Self-pay | Admitting: Cardiology

## 2024-02-10 ENCOUNTER — Telehealth: Payer: Self-pay | Admitting: Family

## 2024-02-10 NOTE — Telephone Encounter (Signed)
 Pre op completed, cleared in outbox Please fax

## 2024-02-11 ENCOUNTER — Telehealth: Admitting: Psychiatry

## 2024-02-11 ENCOUNTER — Encounter: Payer: Self-pay | Admitting: Psychiatry

## 2024-02-11 DIAGNOSIS — F4310 Post-traumatic stress disorder, unspecified: Secondary | ICD-10-CM | POA: Diagnosis not present

## 2024-02-11 DIAGNOSIS — F331 Major depressive disorder, recurrent, moderate: Secondary | ICD-10-CM | POA: Diagnosis not present

## 2024-02-11 MED ORDER — GABAPENTIN 300 MG PO CAPS: 300.0000 mg | ORAL_CAPSULE | Freq: Two times a day (BID) | ORAL | 0 refills | Status: DC

## 2024-02-11 MED ORDER — DULOXETINE HCL 20 MG PO CPEP: 20.0000 mg | ORAL_CAPSULE | Freq: Two times a day (BID) | ORAL | 1 refills | Status: AC

## 2024-02-11 NOTE — Telephone Encounter (Signed)
 Surgical clearance form was faxed on 02/10/24.

## 2024-02-11 NOTE — Progress Notes (Signed)
 Virtual Visit via Video Note  I connected with Sandra Montes on 02/11/24 at  9:30 AM EDT by a video enabled telemedicine application and verified that I am speaking with the correct person using two identifiers.  Location Provider Location : [facility] Patient Location : Home  Participants: Patient , Provider    I discussed the limitations of evaluation and management by telemedicine and the availability of in person appointments. The patient expressed understanding and agreed to proceed.  I discussed the assessment and treatment plan with the patient. The patient was provided an opportunity to ask questions and all were answered. The patient agreed with the plan and demonstrated an understanding of the instructions.   The patient was advised to call back or seek an in-person evaluation if the symptoms worsen or if the condition fails to improve as anticipated.   [facility] MD OP Progress Note  02/11/2024 9:51 AM PHYLLIS ABELSON  MRN:  [id number]  Chief Complaint:  Chief Complaint  Patient presents with   Follow-up   Anxiety   Medication Refill   Depression   Discussed the use of AI scribe software for clinical note transcription with the patient, who gave verbal consent to proceed.  History of Present Illness Sandra Montes is a 52 year old Caucasian female, divorced, lives with her partner, currently employed, lives in [place], has a history of MDD, PTSD, history of alcohol and cannabis abuse in remission, emphysema, hypertension, asthma, osteoarthritis, diabetes was evaluated by telemedicine today.  She reports ongoing irritability, primarily linking it to chronic pain and current sociopolitical stressors. She feels scared by the state of the country. She identifies pain as a significant factor that worsens her mood and irritability, especially as it affects her ability to function at work and adapt to recent changes in her office environment. She denies any thoughts of  harming herself or others.  She continues to experience persistent tiredness, which she associates with her current regimen of gabapentin  300 mg twice daily and trazodone . She takes gabapentin  in the morning and at night to manage pain and notes that the medication causes her to feel tired throughout the day. She also describes experiencing a 'trazodone  hangover.'  She states that her appetite has decreased, which she connects to both her use of Mounjaro  and increased attention to her diet. She reports a weight loss of approximately 1.5 pounds. She confirms that she currently takes gabapentin  300 mg twice daily and duloxetine , and she has not made any other changes to her medications since the last visit.  She is currently working remotely from home that has been beneficial since she continues to struggle with a lot of pain.She is scheduled for knee surgery on November 3rd.   Visit Diagnosis:    ICD-10-CM   1. MDD (major depressive disorder), recurrent episode, moderate (HCC)  F33.1 DULoxetine  (CYMBALTA ) 20 MG capsule   likely with mixed features    2. PTSD (post-traumatic stress disorder)  F43.10 gabapentin  (NEURONTIN ) 300 MG capsule      Past Psychiatric History: I have reviewed past psychiatric history from progress note on 03/24/2023.  Past trials of medications like Pristiq, Wellbutrin, Depakote -weight gain, Paxil, Prozac  Past Medical History:  Past Medical History:  Diagnosis Date   Adnexal mass 05/17/2022   a.) CT A/P 05/18/2023: 17.0 x 16.3 x 12.8 cm --> favored to be a large benign fluid filled ovarian cyst --> surgical resection recommended/pending   Anxiety    Aortic atherosclerosis    Bartholin's gland abscess  CAD (coronary artery disease) 06/04/2023   a.) cCTA 06/04/2023: Ca2+ = 450 (99th %'ile) --> 27.6 LAD, 66.1 LCx, 356 RCA   Chronic left shoulder pain    Depression    Diastolic dysfunction 05/26/2023   a.) TTE 05/26/2023: EF 55-60%, no RWMAs, G1DD, triv AR/MR,  mild AoC sclerosis without stenosis   DOE (dyspnea on exertion)    Emphysema lung (HCC)    History of 2019 novel coronavirus disease (COVID-19) 12/13/2020   History of alcohol abuse    History of allergy     History of cannabis abuse    History of suicidal ideation 09/14/2022   a.) scared to do it; increasing intrusive thoughts about jumping off a cliff because she is tired of fighting; (+) crying, anxiety, depression, inturrupted sleep, appetite changes related to relationship problems (broke up with BF) and what sounds like caregiver role strain (caregiver to very demanding mother); (+) polysubstance use/abuse   HLD (hyperlipidemia)    Hyperlipidemia    Hypertension    Insomnia    a.) uses trazodone  PRN   Irritable bowel syndrome with diarrhea    Nephrolithiasis    Pain in both hands    Polyarthralgia    Positive ANA (antinuclear antibody)    PTSD (post-traumatic stress disorder)    RBBB (right bundle branch block)    Sinus bradycardia    Urinary incontinence    Weight gain due to medication    a.) significant (40lb) weight gain with the initiation of divalproex     Past Surgical History:  Procedure Laterality Date   ABDOMINAL HYSTERECTOMY  07/08/2023   CESAREAN SECTION N/A 1993   CHOLECYSTECTOMY N/A 1996   COLONOSCOPY WITH PROPOFOL  N/A 11/18/2021   Procedure: COLONOSCOPY WITH PROPOFOL ;  Surgeon: Unk Corinn Skiff, MD;  Location: [facility];  Service: Gastroenterology;  Laterality: N/A;   KNEE ARTHROSCOPY WITH MEDIAL MENISECTOMY Right 10/07/2023   Procedure: ARTHROSCOPY, KNEE, WITH MEDIAL MENISCECTOMY;  Surgeon: Onesimo Oneil LABOR, MD;  Location: [facility];  Service: Orthopedics;  Laterality: Right;   ROBOTIC ASSISTED BILATERAL SALPINGO OOPHERECTOMY Bilateral 07/08/2023   Procedure: XI ROBOTIC ASSISTED BILATERAL SALPINGO OOPHORECTOMY, REMOVAL OF ADNEXAL MASS;  Surgeon: Verdon Keen, MD;  Location: [facility];  Service: Gynecology;  Laterality: Bilateral;   TUBAL LIGATION  Bilateral 1995    Family Psychiatric History: I have reviewed family psychiatric history from progress note on 03/24/2023.  Family History:  Family History  Problem Relation Age of Onset   Depression Mother    Alcohol abuse Mother    Heart attack Mother 81   Skin cancer Father    Alcohol abuse Father    Prostate cancer Father    Rheum arthritis Paternal Grandmother    Prostate cancer Paternal Grandfather    Skin cancer Paternal Grandfather    Asthma Daughter    Mental illness Daughter        borderline   Mitral valve prolapse Daughter        & enlarged aorta   Migraines Daughter    Alcohol abuse Cousin    Healthy Son    Healthy Son     Social History: I have reviewed social history from progress note on 03/24/2023. Social History   Socioeconomic History   Marital status: Divorced    Spouse name: Not on file   Number of children: 3   Years of education: 12   Highest education level: Associate degree: occupational, Scientist, product/process development, or vocational program  Occupational History    Employer: [employer]    Comment:  mortgage insurance  Tobacco Use   Smoking status: Former    Current packs/day: 0.00    Average packs/day: 1.7 packs/day for 57.0 years (95.5 ttl pk-yrs)    Types: Cigarettes    Start date: 2004    Quit date: 2024    Years since quitting: 1.7    Passive exposure: Current   Smokeless tobacco: Never   Tobacco comments:    Pt smokes two packs cigarettes for the past 20 yrs, wants 14 mg patch  Vaping Use   Vaping status: Never Used  Substance and Sexual Activity   Alcohol use: Not Currently    Comment: h/o alcoholism quit 2017   Drug use: Yes    Types: Marijuana    Comment: occ   Sexual activity: Not Currently    Partners: Male    Birth control/protection: None  Other Topics Concern   Not on file  Social History Narrative   ** Merged History Encounter **       Active smoker- lives in [place]; with mother- care giver; with quality/control-  mortgage.    Social Drivers of Corporate investment banker Strain: Low Risk  (01/07/2024)   Received from [facility]   Overall Financial Resource Strain (CARDIA)    Difficulty of Paying Living Expenses: Not very hard  Food Insecurity: No Food Insecurity (01/07/2024)   Received from [facility]   Hunger Vital Sign    Within the past 12 months, you worried that your food would run out before you got the money to buy more.: Never true    Within the past 12 months, the food you bought just didn't last and you didn't have money to get more.: Never true  Transportation Needs: No Transportation Needs (01/07/2024)   Received from [facility] - Transportation    In the past 12 months, has lack of transportation kept you from medical appointments or from getting medications?: No    Lack of Transportation (Non-Medical): No  Physical Activity: Unknown (05/14/2023)   Exercise Vital Sign    Days of Exercise per Week: 0 days    Minutes of Exercise per Session: Not on file  Stress: No Stress Concern Present (05/14/2023)   [employer] of Occupational Health - Occupational Stress Questionnaire    Feeling of Stress : Only a little  Social Connections: Socially Isolated (05/14/2023)   Social Connection and Isolation Panel    Frequency of Communication with Friends and Family: More than three times a week    Frequency of Social Gatherings with Friends and Family: Once a week    Attends Religious Services: Never    Database administrator or Organizations: No    Attends Engineer, structural: Not on file    Marital Status: Divorced    Allergies:  Allergies  Allergen Reactions   Buspar [Buspirone] Other (See Comments)    Optical migraines   Lisinopril Cough   Morphine Itching and Other (See Comments)    tried to take her face off due to itching   Norvasc [Amlodipine] Swelling   Sulfa Antibiotics Other (See Comments)     Passes out    Metabolic Disorder Labs: Lab Results  Component Value Date   HGBA1C 6.8 (H) 01/25/2024   MPG 111 07/04/2021   Lab Results  Component Value Date   PROLACTIN 4.3 01/13/2022   Lab Results  Component Value Date   CHOL 138 12/03/2023   TRIG 89 12/03/2023  HDL 55 12/03/2023   CHOLHDL 2.5 12/03/2023   VLDL 87.7 10/20/2022   LDLCALC 66 12/03/2023   LDLCALC 84 10/20/2022   Lab Results  Component Value Date   TSH 2.52 11/17/2023   TSH 4.19 08/18/2023    Therapeutic Level Labs: No results found for: LITHIUM Lab Results  Component Value Date   VALPROATE 54.0 05/17/2023   VALPROATE 64.6 01/01/2023   No results found for: CBMZ  Current Medications: Current Outpatient Medications  Medication Sig Dispense Refill   acetaminophen  (TYLENOL ) 650 MG CR tablet Take 650 mg by mouth every 8 (eight) hours as needed for pain.     albuterol  (VENTOLIN  HFA) 108 (90 Base) MCG/ACT inhaler INHALE 2 PUFFS INTO THE LUNGS EVERY 4 HOURS AS NEEDED FOR WHEEZING OR SHORTNESS OF BREATH 8.5 g 0   aspirin  EC 81 MG tablet Take 81 mg by mouth daily. Swallow whole.     atorvastatin  (LIPITOR) 40 MG tablet Take 1 tablet (40 mg total) by mouth daily. 90 tablet 3   B Complex-C (B-COMPLEX WITH VITAMIN C) tablet Take 1 tablet by mouth in the morning.     Calcium  Citrate-Vitamin D (CITRACAL PETITES/VITAMIN D PO) Take 1 tablet by mouth in the morning and at bedtime.     cetirizine (ZYRTEC) 10 MG tablet Take 10 mg by mouth daily.     DULoxetine  (CYMBALTA ) 20 MG capsule Take 1 capsule (20 mg total) by mouth 2 (two) times daily. 180 capsule 1   ezetimibe  (ZETIA ) 10 MG tablet Take 1 tablet (10 mg total) by mouth daily. 90 tablet 3   gabapentin  (NEURONTIN ) 300 MG capsule Take 1 capsule (300 mg total) by mouth 2 (two) times daily. 180 capsule 0   hydrochlorothiazide  (HYDRODIURIL ) 25 MG tablet Take 1 tablet (25 mg total) by mouth daily. 90 tablet 3   hydrOXYzine  (ATARAX ) 25 MG tablet Take 1 tablet (25 mg  total) by mouth 3 (three) times daily as needed. 90 tablet 0   irbesartan  (AVAPRO ) 300 MG tablet Take 1 tablet (300 mg total) by mouth daily. 90 tablet 3   metoprolol  succinate (TOPROL -XL) 50 MG 24 hr tablet Take 1 tablet (50 mg total) by mouth daily. Take with or immediately following a meal. 90 tablet 3   metroNIDAZOLE  (METROCREAM ) 0.75 % cream Apply to nose and cheeks 1-2 times daily as needed. 45 g 5   Omega-3 1400 MG CAPS Take 2,800 mg by mouth daily.     potassium chloride  (KLOR-CON ) 10 MEQ tablet Take 1 tablet (10 mEq total) by mouth 2 (two) times daily. 180 tablet 3   tirzepatide  (MOUNJARO ) 2.5 MG/0.5ML Pen Inject 2.5 mg into the skin once a week. for diabetes. 2 mL 1   traZODone  (DESYREL ) 50 MG tablet Take 1-2 tablets (50-100 mg total) by mouth at bedtime as needed for sleep. 180 tablet 1   No current facility-administered medications for this visit.     Musculoskeletal: Strength & Muscle Tone: UTA Gait & Station: Seated Patient leans: N/A  Psychiatric Specialty Exam: Review of Systems  Psychiatric/Behavioral:  Positive for sleep disturbance.        Irritable    Last menstrual period 10/04/2022.There is no height or weight on file to calculate BMI.  General Appearance: Casual  Eye Contact:  Fair  Speech:  Clear and Coherent  Volume:  Normal  Mood:  Irritable  Affect:  Congruent  Thought Process:  Goal Directed and Descriptions of Associations: Intact  Orientation:  Full (Time, Place, and Person)  Thought Content: Logical   Suicidal Thoughts:  No  Homicidal Thoughts:  No  Memory:  Immediate;   Fair Recent;   Fair Remote;   Fair  Judgement:  Fair  Insight:  Fair  Psychomotor Activity:  Normal  Concentration:  Concentration: Fair and Attention Span: Fair  Recall:  [employer] of Knowledge: Fair  Language: Fair  Akathisia:  No  Handed:  Right  AIMS (if indicated): not done  Assets:  Communication Skills Desire for Improvement Housing Social  Support Transportation  ADL's:  Intact  Cognition: WNL  Sleep:  varies due to pain   Screenings: AUDIT    Flowsheet Row [facility]  Alcohol Use Disorder Identification Test Final Score (AUDIT) 0   GAD-7    Flowsheet Row Office Visit from 12/02/2023 in [facility] Office Visit from 03/24/2023 in [facility] Office Visit from 03/18/2023 in [facility] Office Visit from 02[facility]16/2023 [facility] Office Visit from 02/11/2023 in [facility]  Total GAD-7 Score 2 3 5 3 11    PHQ2-9    Flowsheet Row Office Visit from 12/02/2023 in [facility] Office Visit from 08/18/2023 in [facility] Office Visit from 05/17/2023 in [facility] Office Visit from 03/24/2023 in [facility] Office Visit from 03/18/2023 in [facility]  PHQ-2 Total Score 1 0 0 2 2  PHQ-9 Total Score -- -- 2 10 6    Flowsheet Row Video Visit from 02/11/2024 in [facility] Video Visit from 01/28/2024 in [facility] Video Visit from 12/31/2023 in [facility]  C-SSRS RISK CATEGORY No Risk No Risk No Risk     Assessment and Plan: ANGELYNE TERWILLIGER is a 53 year old Caucasian female who has a history of depression, PTSD was evaluated by telemedicine today.  Discussed assessment and plan as noted below.  1. MDD (major depressive disorder), recurrent episode, moderate (HCC)- unstable Currently with ongoing depression symptoms mostly irritability likely due to comorbid pain, has  upcoming knee surgery scheduled in 3 weeks from now. Continue Gabapentin  300 mg twice daily, could use it as a one-time dosage at bedtime. Continue Duloxetine  20 mg twice daily Continue psychotherapy sessions with Mr. YASMIN Tschupp.   2. PTSD (post-traumatic stress disorder)-stable Denies any significant intrusive memories flashbacks or nightmares.  Sleep problems likely due to pain Continue Trazodone  50 to 100 mg at bedtime as needed Continue Hydroxyzine  25 mg 3 times a day as needed Continue psychotherapy sessions  Follow-up Follow-up in clinic in 8 to 12 weeks or sooner if needed.    Collaboration of Care: Collaboration of Care: Referral or follow-up with counselor/therapist AEB encouraged to continue psychotherapy sessions as well as pain management.  Patient to follow-up after recovery from surgery which is coming up.  Patient/Guardian was advised Release of Information must be obtained prior to any record release in order to collaborate their care with an outside provider. Patient/Guardian was advised if they have not already done so to contact the registration department to sign all necessary forms in order for us  to release information regarding their care.   Consent: Patient/Guardian gives verbal consent for treatment and assignment of  benefits for services provided during this visit. Patient/Guardian expressed understanding and agreed to proceed.  This note was generated in part or whole with voice recognition software. Voice recognition is usually quite accurate but there are transcription errors that can and very often do occur. I apologize for any typographical errors that were not detected and corrected.     Burns Timson, MD 02/13/2024, 7:14 AM

## 2024-02-22 ENCOUNTER — Encounter
Admission: RE | Admit: 2024-02-22 | Discharge: 2024-02-22 | Disposition: A | Discharge status: Home / Self Care | Source: Ambulatory Visit | Attending: Orthopedic Surgery | Admitting: Orthopedic Surgery

## 2024-02-22 ENCOUNTER — Other Ambulatory Visit: Payer: Self-pay

## 2024-02-22 VITALS — BP 132/86 | HR 63 | Resp 14 | Ht 67.0 in | Wt 250.1 lb

## 2024-02-22 DIAGNOSIS — E119 Type 2 diabetes mellitus without complications: Secondary | ICD-10-CM | POA: Diagnosis not present

## 2024-02-22 DIAGNOSIS — Z79899 Other long term (current) drug therapy: Secondary | ICD-10-CM | POA: Insufficient documentation

## 2024-02-22 DIAGNOSIS — Z01812 Encounter for preprocedural laboratory examination: Secondary | ICD-10-CM | POA: Diagnosis present

## 2024-02-22 DIAGNOSIS — Z01818 Encounter for other preprocedural examination: Secondary | ICD-10-CM

## 2024-02-22 DIAGNOSIS — I10 Essential (primary) hypertension: Secondary | ICD-10-CM | POA: Insufficient documentation

## 2024-02-22 DIAGNOSIS — M1711 Unilateral primary osteoarthritis, right knee: Secondary | ICD-10-CM

## 2024-02-22 HISTORY — DX: Unilateral primary osteoarthritis, right knee: M17.11

## 2024-02-22 HISTORY — DX: Cannabis use, unspecified, uncomplicated: F12.90

## 2024-02-22 HISTORY — DX: Type 2 diabetes mellitus without complications: E11.9

## 2024-02-22 HISTORY — DX: Anemia, unspecified: D64.9

## 2024-02-22 LAB — URINALYSIS, ROUTINE W REFLEX MICROSCOPIC
Bilirubin Urine: NEGATIVE
Glucose, UA: NEGATIVE mg/dL
Hgb urine dipstick: NEGATIVE
Ketones, ur: NEGATIVE mg/dL
Nitrite: NEGATIVE
Protein, ur: NEGATIVE mg/dL
Specific Gravity, Urine: 1.004 — ABNORMAL LOW (ref 1.005–1.030)
pH: 7 (ref 5.0–8.0)

## 2024-02-22 LAB — BASIC METABOLIC PANEL WITH GFR
Anion gap: 11 (ref 5–15)
BUN: 14 mg/dL (ref 6–20)
CO2: 30 mmol/L (ref 22–32)
Calcium: 8.9 mg/dL (ref 8.9–10.3)
Chloride: 97 mmol/L — ABNORMAL LOW (ref 98–111)
Creatinine, Ser: 0.57 mg/dL (ref 0.44–1.00)
GFR, Estimated: >60 mL/min (ref >60)
Glucose, Bld: 88 mg/dL (ref 70–99)
Potassium: 3.2 mmol/L — ABNORMAL LOW (ref 3.5–5.1)
Sodium: 138 mmol/L (ref 135–145)

## 2024-02-22 LAB — SURGICAL PCR SCREEN
MRSA, PCR: NEGATIVE
Staphylococcus aureus: NEGATIVE

## 2024-02-22 NOTE — Patient Instructions (Addendum)
 Your procedure is scheduled on:03-06-24 Monday Report to the Registration Desk on the 1st floor of the [facility].Then proceed to the 2nd floor Surgery Desk To find out your arrival time, please call [phone] between 1PM - 3PM on:03-03-24 Friday If your arrival time is 6:00 am, do not arrive before that time as the [facility] entrance doors do not open until 6:00 am.  REMEMBER: Instructions that are not followed completely may result in serious medical risk, up to and including death; or upon the discretion of your surgeon and anesthesiologist your surgery may need to be rescheduled.  Do not eat food after midnight the night before surgery.  No gum chewing or hard candies.  You may however, drink Water up to 2 hours before you are scheduled to arrive for your surgery. Do not drink anything within 2 hours of your scheduled arrival time.  In addition, your doctor has ordered for you to drink the provided:  Gatorade G2 Drinking this carbohydrate drink up to two hours before surgery helps to reduce insulin resistance and improve patient outcomes. Please complete drinking 2 hours before scheduled arrival time.  One week prior to surgery:Last dose will be on 02-27-24 Sunday Stop Anti-inflammatories (NSAIDS) such as Advil , Aleve, Ibuprofen , Motrin , Naproxen, Naprosyn and Aspirin  based products such as Excedrin, Goody's Powder, BC Powder. Stop ANY OVER THE COUNTER supplements until after surgery (B Complex with Vitamin C, Citracal /Vitamin D, Glucosamine-Chondroitin, Omega-3)  You may however, continue to take Tylenol  if needed for pain up until the day of surgery.  Stop tirzepatide  (MOUNJARO ) 7 days prior to surgery-Last dose will be on  02-27-24 Sunday  Continue taking all of your other prescription medications up until the day of surgery.  ON THE DAY OF SURGERY ONLY TAKE THESE MEDICATIONS WITH SIPS OF WATER: -DULoxetine  (CYMBALTA )  -gabapentin  (NEURONTIN ) -potassium chloride   (KLOR-CON )  -cetirizine (ZYRTEC)  -You may take hydrOXYzine  (ATARAX ) if needed for anxiety  Continue your 81 mg Aspirin  up until the day prior to surgery-Do NOT take the day of surgery  Bring your Albuterol  Inhaler to the hospital  No Alcohol for 24 hours before or after surgery.  No Smoking including e-cigarettes for 24 hours before surgery.  No chewable tobacco products for at least 6 hours before surgery.  No nicotine  patches on the day of surgery.  Do not use any recreational drugs for at least a week (preferably 2 weeks) before your surgery.  Please be advised that the combination of cocaine and anesthesia may have negative outcomes, up to and including death. If you test positive for cocaine, your surgery will be cancelled.  On the morning of surgery brush your teeth with toothpaste and water, you may rinse your mouth with mouthwash if you wish. Do not swallow any toothpaste or mouthwash.  Use CHG Soap as directed on instruction sheet.  Do not wear jewelry, make-up, hairpins, clips or nail polish.  For welded (permanent) jewelry: bracelets, anklets, waist bands, etc.  Please have this removed prior to surgery.  If it is not removed, there is a chance that hospital personnel will need to cut it off on the day of surgery.  Do not wear lotions, powders, or perfumes.   Do not shave body hair from the neck down 48 hours before surgery.  Contact lenses, hearing aids and dentures may not be worn into surgery.  Do not bring valuables to the hospital. [facility] is not responsible for any missing/lost belongings or valuables.   Notify  your doctor if there is any change in your medical condition (cold, fever, infection).  Wear comfortable clothing (specific to your surgery type) to the hospital.  After surgery, you can help prevent lung complications by doing breathing exercises.  Take deep breaths and cough every 1-2 hours. Your doctor may order a device called an Incentive  Spirometer to help you take deep breaths. When coughing or sneezing, hold a pillow firmly against your incision with both hands. This is called "splinting." Doing this helps protect your incision. It also decreases belly discomfort.  If you are being admitted to the hospital overnight, leave your suitcase in the car. After surgery it may be brought to your room.  In case of increased patient census, it may be necessary for you, the patient, to continue your postoperative care in the Same Day Surgery department.  If you are being discharged the day of surgery, you will not be allowed to drive home. You will need a responsible individual to drive you home and stay with you for 24 hours after surgery.   If you are taking public transportation, you will need to have a responsible individual with you.  Please call the Pre-admissions Testing Dept. at [phone] if you have any questions about these instructions.  Surgery Visitation Policy:  Patients having surgery or a procedure may have two visitors.  Children under the age of 60 must have an adult with them who is not the patient.  Inpatient Visitation:    Visiting hours are 7 a.m. to 8 p.m. Up to four visitors are allowed at one time in a patient room. The visitors may rotate out with other people during the day.  One visitor age 65 or older may stay with the patient overnight and must be in the room by 8 p.m.    Pre-operative 4 CHG Bath Instructions   You can play a key role in reducing the risk of infection after surgery. Your skin needs to be as free of germs as possible. You can reduce the number of germs on your skin by washing with CHG (chlorhexidine  gluconate) soap before surgery. CHG is an antiseptic soap that kills germs and continues to kill germs even after washing.   DO NOT use if you have an allergy  to chlorhexidine /CHG or antibacterial soaps. If your skin becomes reddened or irritated, stop using the CHG and notify one  of our RNs at [phone].   Please shower with the CHG soap starting 4 days before surgery using the following schedule:     Please keep in mind the following:  DO NOT shave, including legs and underarms, starting the day of your first shower.   You may shave your face at any point before/day of surgery.  Place clean sheets on your bed the day you start using CHG soap. Use a clean washcloth (not used since being washed) for each shower. DO NOT sleep with pets once you start using the CHG.   CHG Shower Instructions:  If you choose to wash your hair and private area, wash first with your normal shampoo/soap.  After you use shampoo/soap, rinse your hair and body thoroughly to remove shampoo/soap residue.  Turn the water OFF and apply about 3 tablespoons (45 ml) of CHG soap to a CLEAN washcloth.  Apply CHG soap ONLY FROM YOUR NECK DOWN TO YOUR TOES (washing for 3-5 minutes)  DO NOT use CHG soap on face, private areas, open wounds, or sores.  Pay special attention to the  area where your surgery is being performed.  If you are having back surgery, having someone wash your back for you may be helpful. Wait 2 minutes after CHG soap is applied, then you may rinse off the CHG soap.  Pat dry with a clean towel  Put on clean clothes/pajamas   If you choose to wear lotion, please use ONLY the CHG-compatible lotions on the back of this paper.     Additional instructions for the day of surgery: DO NOT APPLY any lotions, deodorants, cologne, or perfumes.   Put on clean/comfortable clothes.  Brush your teeth.  Ask your nurse before applying any prescription medications to the skin.      CHG Compatible Lotions   Aveeno Moisturizing lotion  Cetaphil Moisturizing Cream  Cetaphil Moisturizing Lotion  Clairol Herbal Essence Moisturizing Lotion, Dry Skin  Clairol Herbal Essence Moisturizing Lotion, Extra Dry Skin  Clairol Herbal Essence Moisturizing Lotion, Normal Skin  Curel Age Defying  Therapeutic Moisturizing Lotion with Alpha Hydroxy  Curel Extreme Care Body Lotion  Curel Soothing Hands Moisturizing Hand Lotion  Curel Therapeutic Moisturizing Cream, Fragrance-Free  Curel Therapeutic Moisturizing Lotion, Fragrance-Free  Curel Therapeutic Moisturizing Lotion, Original Formula  Eucerin Daily Replenishing Lotion  Eucerin Dry Skin Therapy Plus Alpha Hydroxy Crme  Eucerin Dry Skin Therapy Plus Alpha Hydroxy Lotion  Eucerin Original Crme  Eucerin Original Lotion  Eucerin Plus Crme Eucerin Plus Lotion  Eucerin TriLipid Replenishing Lotion  Keri Anti-Bacterial Hand Lotion  Keri Deep Conditioning Original Lotion Dry Skin Formula Softly Scented  Keri Deep Conditioning Original Lotion, Fragrance Free Sensitive Skin Formula  Keri Lotion Fast Absorbing Fragrance Free Sensitive Skin Formula  Keri Lotion Fast Absorbing Softly Scented Dry Skin Formula  Keri Original Lotion  Keri Skin Renewal Lotion Keri Silky Smooth Lotion  Keri Silky Smooth Sensitive Skin Lotion  Nivea Body Creamy Conditioning Oil  Nivea Body Extra Enriched Lotion  Nivea Body Original Lotion  Nivea Body Sheer Moisturizing Lotion Nivea Crme  Nivea Skin Firming Lotion  NutraDerm 30 Skin Lotion  NutraDerm Skin Lotion  NutraDerm Therapeutic Skin Cream  NutraDerm Therapeutic Skin Lotion  ProShield Protective Hand Cream  Provon moisturizing lotion  How to Use an Incentive Spirometer An incentive spirometer is a tool that measures how well you are filling your lungs with each breath. Learning to take long, deep breaths using this tool can help you keep your lungs clear and active. This may help to reverse or lessen your chance of developing breathing (pulmonary) problems, especially infection. You may be asked to use a spirometer: After a surgery. If you have a lung problem or a history of smoking. After a long period of time when you have been unable to move or be active. If the spirometer includes an  indicator to show the highest number that you have reached, your health care provider or respiratory therapist will help you set a goal. Keep a log of your progress as told by your health care provider. What are the risks? Breathing too quickly may cause dizziness or cause you to pass out. Take your time so you do not get dizzy or light-headed. If you are in pain, you may need to take pain medicine before doing incentive spirometry. It is harder to take a deep breath if you are having pain. How to use your incentive spirometer  Sit up on the edge of your bed or on a chair. Hold the incentive spirometer so that it is in an upright position. Before you use  the spirometer, breathe out normally. Place the mouthpiece in your mouth. Make sure your lips are closed tightly around it. Breathe in slowly and as deeply as you can through your mouth, causing the piston or the ball to rise toward the top of the chamber. Hold your breath for 3-5 seconds, or for as long as possible. If the spirometer includes a coach indicator, use this to guide you in breathing. Slow down your breathing if the indicator goes above the marked areas. Remove the mouthpiece from your mouth and breathe out normally. The piston or ball will return to the bottom of the chamber. Rest for a few seconds, then repeat the steps 10 or more times. Take your time and take a few normal breaths between deep breaths so that you do not get dizzy or light-headed. Do this every 1-2 hours when you are awake. If the spirometer includes a goal marker to show the highest number you have reached (best effort), use this as a goal to work toward during each repetition. After each set of 10 deep breaths, cough a few times. This will help to make sure that your lungs are clear. If you have an incision on your chest or abdomen from surgery, place a pillow or a rolled-up towel firmly against the incision when you cough. This can help to reduce pain while  taking deep breaths and coughing. General tips When you are able to get out of bed: Walk around often. Continue to take deep breaths and cough in order to clear your lungs. Keep using the incentive spirometer until your health care provider says it is okay to stop using it. If you have been in the hospital, you may be told to keep using the spirometer at home. Contact a health care provider if: You are having difficulty using the spirometer. You have trouble using the spirometer as often as instructed. Your pain medicine is not giving enough relief for you to use the spirometer as told. You have a fever. Get help right away if: You develop shortness of breath. You develop a cough with bloody mucus from the lungs. You have fluid or blood coming from an incision site after you cough. Summary An incentive spirometer is a tool that can help you learn to take long, deep breaths to keep your lungs clear and active. You may be asked to use a spirometer after a surgery, if you have a lung problem or a history of smoking, or if you have been inactive for a long period of time. Use your incentive spirometer as instructed every 1-2 hours while you are awake. If you have an incision on your chest or abdomen, place a pillow or a rolled-up towel firmly against your incision when you cough. This will help to reduce pain. Get help right away if you have shortness of breath, you cough up bloody mucus, or blood comes from your incision when you cough. This information is not intended to replace advice given to you by your health care provider. Make sure you discuss any questions you have with your health care provider. Document Revised: 02/26/2023 Document Reviewed: 02/26/2023 Elsevier Patient Education  2024 [employer]  Preoperative Educational Videos for Total Hip, Knee and Shoulder Replacements  To better prepare for surgery, please view our videos that explain the physical activity and discharge  planning required to have the best surgical recovery at [facility].  [email or web address]  Questions? Call [phone] or email [email or web address]  Merchandiser, retail to address health-related social needs:  [email or web address]

## 2024-02-24 ENCOUNTER — Encounter: Payer: Self-pay | Admitting: Cardiology

## 2024-02-24 DIAGNOSIS — R9431 Abnormal electrocardiogram [ECG] [EKG]: Secondary | ICD-10-CM

## 2024-02-24 DIAGNOSIS — R0609 Other forms of dyspnea: Secondary | ICD-10-CM

## 2024-02-24 DIAGNOSIS — Z8249 Family history of ischemic heart disease and other diseases of the circulatory system: Secondary | ICD-10-CM

## 2024-02-25 NOTE — Telephone Encounter (Signed)
 Looks like Dr. Calhoun was filling it for her.  Sure - send in potassium chloride  (KLOR-CON ) 10 MEQ tablet, Take 1 tablet (10 mEq total) by mouth 2 (two) times daily.,90day. Follow up refills can be done with PCP.   Eve Rey Starbrick, DO, FACC

## 2024-02-28 MED ORDER — POTASSIUM CHLORIDE ER 10 MEQ PO TBCR: 10.0000 meq | EXTENDED_RELEASE_TABLET | Freq: Two times a day (BID) | ORAL | 0 refills | Status: DC

## 2024-03-06 ENCOUNTER — Ambulatory Visit: Payer: Self-pay | Admitting: Urgent Care

## 2024-03-06 ENCOUNTER — Ambulatory Visit

## 2024-03-06 ENCOUNTER — Other Ambulatory Visit: Payer: Self-pay

## 2024-03-06 ENCOUNTER — Encounter: Payer: Self-pay | Admitting: Orthopedic Surgery

## 2024-03-06 ENCOUNTER — Encounter: Admission: RE | Disposition: A | Payer: Self-pay | Source: Home / Self Care | Attending: Orthopedic Surgery

## 2024-03-06 ENCOUNTER — Ambulatory Visit
Admission: RE | Admit: 2024-03-06 | Discharge: 2024-03-07 | Disposition: A | Discharge status: Home Health | Attending: Orthopedic Surgery | Admitting: Orthopedic Surgery

## 2024-03-06 DIAGNOSIS — E669 Obesity, unspecified: Secondary | ICD-10-CM | POA: Diagnosis not present

## 2024-03-06 DIAGNOSIS — J449 Chronic obstructive pulmonary disease, unspecified: Secondary | ICD-10-CM | POA: Diagnosis not present

## 2024-03-06 DIAGNOSIS — E119 Type 2 diabetes mellitus without complications: Secondary | ICD-10-CM | POA: Insufficient documentation

## 2024-03-06 DIAGNOSIS — M1711 Unilateral primary osteoarthritis, right knee: Secondary | ICD-10-CM | POA: Diagnosis present

## 2024-03-06 DIAGNOSIS — Z96651 Presence of right artificial knee joint: Secondary | ICD-10-CM

## 2024-03-06 DIAGNOSIS — Z6839 Body mass index (BMI) 39.0-39.9, adult: Secondary | ICD-10-CM | POA: Insufficient documentation

## 2024-03-06 DIAGNOSIS — Z87891 Personal history of nicotine dependence: Secondary | ICD-10-CM | POA: Insufficient documentation

## 2024-03-06 DIAGNOSIS — Z01818 Encounter for other preprocedural examination: Secondary | ICD-10-CM

## 2024-03-06 DIAGNOSIS — M25561 Pain in right knee: Secondary | ICD-10-CM | POA: Diagnosis present

## 2024-03-06 HISTORY — PX: TOTAL KNEE ARTHROPLASTY: SHX125

## 2024-03-06 LAB — GLUCOSE, CAPILLARY
Glucose-Capillary: 93 mg/dL (ref 70–99)
Glucose-Capillary: 131 mg/dL — ABNORMAL HIGH (ref 70–99)

## 2024-03-06 SURGERY — ARTHROPLASTY, KNEE, TOTAL
Anesthesia: Spinal | Site: Knee | Laterality: Right

## 2024-03-06 MED ORDER — GABAPENTIN 100 MG PO CAPS
300.0000 mg | ORAL_CAPSULE | Freq: Two times a day (BID) | ORAL | Status: DC
  Administered 2024-03-06 – 2024-03-07 (×2): 300 mg via ORAL
  Filled 2024-03-06 (×2): qty 3

## 2024-03-06 MED ORDER — DEXAMETHASONE SOD PHOSPHATE PF 10 MG/ML IJ SOLN
8.0000 mg | Freq: Once | INTRAMUSCULAR | Status: AC
Start: 2024-03-06 — End: 2024-03-06
  Administered 2024-03-06: 10 mg via INTRAVENOUS

## 2024-03-06 MED ORDER — EZETIMIBE 10 MG PO TABS
10.0000 mg | ORAL_TABLET | Freq: Every day | ORAL | Status: DC
  Administered 2024-03-07: 10 mg via ORAL

## 2024-03-06 MED ORDER — IRBESARTAN 150 MG PO TABS
300.0000 mg | ORAL_TABLET | ORAL | Status: DC
  Administered 2024-03-07: 300 mg via ORAL
  Filled 2024-03-06: qty 2

## 2024-03-06 MED ORDER — ATORVASTATIN CALCIUM 10 MG PO TABS
40.0000 mg | ORAL_TABLET | Freq: Every day | ORAL | Status: DC
  Administered 2024-03-07: 40 mg via ORAL
  Filled 2024-03-06: qty 4

## 2024-03-06 MED ORDER — ACETAMINOPHEN 500 MG PO TABS
ORAL_TABLET | ORAL | Status: AC
  Filled 2024-03-06: qty 2

## 2024-03-06 MED ORDER — FENTANYL CITRATE (PF) 100 MCG/2ML IJ SOLN: 25.0000 ug | INTRAMUSCULAR | Status: DC | PRN

## 2024-03-06 MED ORDER — ORAL CARE MOUTH RINSE: 15.0000 mL | Freq: Once | OROMUCOSAL | Status: AC

## 2024-03-06 MED ORDER — CHLORHEXIDINE GLUCONATE 0.12 % MT SOLN
OROMUCOSAL | Status: AC
  Filled 2024-03-06: qty 15

## 2024-03-06 MED ORDER — HYDROCODONE-ACETAMINOPHEN 5-325 MG PO TABS
1.0000 | ORAL_TABLET | ORAL | Status: DC | PRN
  Administered 2024-03-07: 1 via ORAL
  Filled 2024-03-06: qty 1

## 2024-03-06 MED ORDER — FENTANYL CITRATE (PF) 100 MCG/2ML IJ SOLN
INTRAMUSCULAR | Status: AC
  Filled 2024-03-06: qty 2

## 2024-03-06 MED ORDER — KETOROLAC TROMETHAMINE 15 MG/ML IJ SOLN
7.5000 mg | Freq: Four times a day (QID) | INTRAMUSCULAR | Status: DC
  Administered 2024-03-06 – 2024-03-07 (×2): 7.5 mg via INTRAVENOUS
  Filled 2024-03-06 (×2): qty 1

## 2024-03-06 MED ORDER — METOPROLOL SUCCINATE ER 25 MG PO TB24
50.0000 mg | ORAL_TABLET | Freq: Every evening | ORAL | Status: DC
  Administered 2024-03-06: 50 mg via ORAL
  Filled 2024-03-06: qty 2

## 2024-03-06 MED ORDER — TRAZODONE HCL 50 MG PO TABS
50.0000 mg | ORAL_TABLET | Freq: Every evening | ORAL | Status: DC | PRN
  Administered 2024-03-06: 100 mg via ORAL
  Filled 2024-03-06: qty 2

## 2024-03-06 MED ORDER — BUPIVACAINE HCL (PF) 0.5 % IJ SOLN: INTRAMUSCULAR | Status: DC | PRN

## 2024-03-06 MED ORDER — PROPOFOL 1000 MG/100ML IV EMUL
INTRAVENOUS | Status: AC
  Filled 2024-03-06: qty 100

## 2024-03-06 MED ORDER — BUPIVACAINE HCL (PF) 0.5 % IJ SOLN
INTRAMUSCULAR | Status: DC | PRN
  Administered 2024-03-06: 2.6 mL via INTRATHECAL

## 2024-03-06 MED ORDER — ASPIRIN 81 MG PO TBEC
81.0000 mg | DELAYED_RELEASE_TABLET | Freq: Every day | ORAL | Status: DC
  Administered 2024-03-07: 81 mg via ORAL
  Filled 2024-03-06: qty 1

## 2024-03-06 MED ORDER — TRAMADOL HCL 50 MG PO TABS
50.0000 mg | ORAL_TABLET | Freq: Four times a day (QID) | ORAL | Status: DC | PRN
  Administered 2024-03-06 – 2024-03-07 (×2): 50 mg via ORAL
  Filled 2024-03-06 (×2): qty 1

## 2024-03-06 MED ORDER — KETAMINE HCL 10 MG/ML IJ SOLN
INTRAMUSCULAR | Status: DC | PRN
  Administered 2024-03-06: 30 mg via INTRAVENOUS

## 2024-03-06 MED ORDER — ACETAMINOPHEN 500 MG PO TABS
1000.0000 mg | ORAL_TABLET | Freq: Three times a day (TID) | ORAL | Status: DC
  Administered 2024-03-06 – 2024-03-07 (×2): 1000 mg via ORAL
  Filled 2024-03-06: qty 2

## 2024-03-06 MED ORDER — ONDANSETRON HCL 4 MG/2ML IJ SOLN: 4.0000 mg | Freq: Four times a day (QID) | INTRAMUSCULAR | Status: DC | PRN

## 2024-03-06 MED ORDER — SURGIPHOR WOUND IRRIGATION SYSTEM - OPTIME
TOPICAL | Status: DC | PRN
Start: 2024-03-06 — End: 2024-03-06
  Administered 2024-03-06: 450 mL

## 2024-03-06 MED ORDER — PROPOFOL 500 MG/50ML IV EMUL
INTRAVENOUS | Status: DC | PRN
Start: 2024-03-06 — End: 2024-03-06
  Administered 2024-03-06: 110 ug/kg/min via INTRAVENOUS
  Administered 2024-03-06: 125 ug/kg/min via INTRAVENOUS

## 2024-03-06 MED ORDER — BUPIVACAINE-EPINEPHRINE (PF) 0.25% -1:200000 IJ SOLN
INTRAMUSCULAR | Status: DC | PRN
Start: 2024-03-06 — End: 2024-03-06
  Administered 2024-03-06: 30 mL

## 2024-03-06 MED ORDER — ACETAMINOPHEN 325 MG PO TABS: 325.0000 mg | ORAL_TABLET | Freq: Four times a day (QID) | ORAL | Status: DC | PRN

## 2024-03-06 MED ORDER — BUPIVACAINE HCL (PF) 0.5 % IJ SOLN
INTRAMUSCULAR | Status: AC
  Filled 2024-03-06: qty 10

## 2024-03-06 MED ORDER — PANTOPRAZOLE SODIUM 40 MG PO TBEC
40.0000 mg | DELAYED_RELEASE_TABLET | Freq: Every day | ORAL | Status: DC
  Administered 2024-03-07: 40 mg via ORAL
  Filled 2024-03-06: qty 1

## 2024-03-06 MED ORDER — SODIUM CHLORIDE 0.9 % IR SOLN
Status: DC | PRN
  Administered 2024-03-06: 1

## 2024-03-06 MED ORDER — DOCUSATE SODIUM 100 MG PO CAPS
100.0000 mg | ORAL_CAPSULE | Freq: Two times a day (BID) | ORAL | Status: DC
  Administered 2024-03-06 – 2024-03-07 (×2): 100 mg via ORAL
  Filled 2024-03-06 (×2): qty 1

## 2024-03-06 MED ORDER — MORPHINE SULFATE (PF) 4 MG/ML IV SOLN: 0.5000 mg | INTRAVENOUS | Status: DC | PRN

## 2024-03-06 MED ORDER — SODIUM CHLORIDE (PF) 0.9 % IJ SOLN
INTRAMUSCULAR | Status: DC | PRN
  Administered 2024-03-06: 20 mL

## 2024-03-06 MED ORDER — DULOXETINE HCL 20 MG PO CPEP
20.0000 mg | ORAL_CAPSULE | Freq: Two times a day (BID) | ORAL | Status: DC
  Administered 2024-03-06 – 2024-03-07 (×2): 20 mg via ORAL
  Filled 2024-03-06 (×3): qty 1

## 2024-03-06 MED ORDER — CHLORHEXIDINE GLUCONATE 0.12 % MT SOLN
15.0000 mL | Freq: Once | OROMUCOSAL | Status: AC
  Administered 2024-03-06: 15 mL via OROMUCOSAL

## 2024-03-06 MED ORDER — HYDROCHLOROTHIAZIDE 25 MG PO TABS
25.0000 mg | ORAL_TABLET | Freq: Every day | ORAL | Status: DC
  Administered 2024-03-07: 25 mg via ORAL
  Filled 2024-03-06: qty 1

## 2024-03-06 MED ORDER — KETOROLAC TROMETHAMINE 30 MG/ML IJ SOLN
INTRAMUSCULAR | Status: AC
  Filled 2024-03-06: qty 1

## 2024-03-06 MED ORDER — BUPIVACAINE-EPINEPHRINE (PF) 0.25% -1:200000 IJ SOLN
INTRAMUSCULAR | Status: AC
  Filled 2024-03-06: qty 30

## 2024-03-06 MED ORDER — KETAMINE HCL 50 MG/5ML IJ SOSY
PREFILLED_SYRINGE | INTRAMUSCULAR | Status: AC
  Filled 2024-03-06: qty 5

## 2024-03-06 MED ORDER — PROPOFOL 10 MG/ML IV BOLUS
INTRAVENOUS | Status: AC
  Filled 2024-03-06: qty 20

## 2024-03-06 MED ORDER — MIDAZOLAM HCL 5 MG/5ML IJ SOLN
INTRAMUSCULAR | Status: DC | PRN
  Administered 2024-03-06: 2 mg via INTRAVENOUS

## 2024-03-06 MED ORDER — ORAL CARE MOUTH RINSE: 15.0000 mL | OROMUCOSAL | Status: DC | PRN

## 2024-03-06 MED ORDER — SODIUM CHLORIDE 0.9 % IV SOLN: INTRAVENOUS | Status: DC

## 2024-03-06 MED ORDER — METOCLOPRAMIDE HCL 5 MG PO TABS: 5.0000 mg | ORAL_TABLET | Freq: Three times a day (TID) | ORAL | Status: DC | PRN

## 2024-03-06 MED ORDER — MENTHOL 3 MG MT LOZG: 1.0000 | LOZENGE | OROMUCOSAL | Status: DC | PRN

## 2024-03-06 MED ORDER — PHENOL 1.4 % MT LIQD: 1.0000 | OROMUCOSAL | Status: DC | PRN

## 2024-03-06 MED ORDER — VASOPRESSIN 20 UNIT/ML IV SOLN
INTRAVENOUS | Status: DC | PRN
  Administered 2024-03-06 (×3): 1 [IU] via INTRAVENOUS

## 2024-03-06 MED ORDER — ALBUTEROL SULFATE (2.5 MG/3ML) 0.083% IN NEBU: 2.5000 mg | INHALATION_SOLUTION | RESPIRATORY_TRACT | Status: DC | PRN

## 2024-03-06 MED ORDER — CEFAZOLIN SODIUM-DEXTROSE 2-4 GM/100ML-% IV SOLN
INTRAVENOUS | Status: AC
  Filled 2024-03-06: qty 100

## 2024-03-06 MED ORDER — CEFAZOLIN SODIUM-DEXTROSE 2-4 GM/100ML-% IV SOLN
2.0000 g | Freq: Four times a day (QID) | INTRAVENOUS | Status: AC
  Administered 2024-03-06 – 2024-03-07 (×2): 2 g via INTRAVENOUS
  Filled 2024-03-06 (×4): qty 100

## 2024-03-06 MED ORDER — ENOXAPARIN SODIUM 30 MG/0.3ML IJ SOSY
30.0000 mg | PREFILLED_SYRINGE | Freq: Two times a day (BID) | INTRAMUSCULAR | Status: DC
  Administered 2024-03-07: 30 mg via SUBCUTANEOUS
  Filled 2024-03-06: qty 0.3

## 2024-03-06 MED ORDER — FENTANYL CITRATE (PF) 100 MCG/2ML IJ SOLN
INTRAMUSCULAR | Status: DC | PRN
  Administered 2024-03-06 (×2): 50 ug via INTRAVENOUS

## 2024-03-06 MED ORDER — PHENYLEPHRINE 80 MCG/ML (10ML) SYRINGE FOR IV PUSH (FOR BLOOD PRESSURE SUPPORT)
PREFILLED_SYRINGE | INTRAVENOUS | Status: DC | PRN
  Administered 2024-03-06: 80 ug via INTRAVENOUS
  Administered 2024-03-06: 160 ug via INTRAVENOUS

## 2024-03-06 MED ORDER — BUPIVACAINE LIPOSOME 1.3 % IJ SUSP
INTRAMUSCULAR | Status: AC
  Filled 2024-03-06: qty 20

## 2024-03-06 MED ORDER — KETOROLAC TROMETHAMINE 30 MG/ML IJ SOLN
INTRAMUSCULAR | Status: DC | PRN
  Administered 2024-03-06: 30 mg via INTRAMUSCULAR

## 2024-03-06 MED ORDER — BUPIVACAINE LIPOSOME 1.3 % IJ SUSP
INTRAMUSCULAR | Status: DC | PRN
  Administered 2024-03-06: 20 mL

## 2024-03-06 MED ORDER — EPHEDRINE SULFATE-NACL 50-0.9 MG/10ML-% IV SOSY
PREFILLED_SYRINGE | INTRAVENOUS | Status: DC | PRN
  Administered 2024-03-06 (×5): 5 mg via INTRAVENOUS

## 2024-03-06 MED ORDER — MIDAZOLAM HCL 2 MG/2ML IJ SOLN
INTRAMUSCULAR | Status: AC
  Filled 2024-03-06: qty 2

## 2024-03-06 MED ORDER — METOCLOPRAMIDE HCL 5 MG/ML IJ SOLN: 5.0000 mg | Freq: Three times a day (TID) | INTRAMUSCULAR | Status: DC | PRN

## 2024-03-06 MED ORDER — ONDANSETRON HCL 4 MG PO TABS: 4.0000 mg | ORAL_TABLET | Freq: Four times a day (QID) | ORAL | Status: DC | PRN

## 2024-03-06 MED ORDER — TRANEXAMIC ACID-NACL 1000-0.7 MG/100ML-% IV SOLN
INTRAVENOUS | Status: AC
  Filled 2024-03-06: qty 100

## 2024-03-06 MED ORDER — DIPHENHYDRAMINE HCL 12.5 MG/5ML PO ELIX: 12.5000 mg | ORAL_SOLUTION | ORAL | Status: DC | PRN

## 2024-03-06 MED ORDER — PROPOFOL 10 MG/ML IV BOLUS
INTRAVENOUS | Status: DC | PRN
  Administered 2024-03-06: 50 mg via INTRAVENOUS

## 2024-03-06 MED ORDER — SODIUM CHLORIDE (PF) 0.9 % IJ SOLN
INTRAMUSCULAR | Status: AC
  Filled 2024-03-06: qty 20

## 2024-03-06 MED ORDER — LIDOCAINE HCL (CARDIAC) PF 100 MG/5ML IV SOSY
PREFILLED_SYRINGE | INTRAVENOUS | Status: DC | PRN
  Administered 2024-03-06: 100 mg via INTRAVENOUS

## 2024-03-06 MED ORDER — ONDANSETRON HCL 4 MG/2ML IJ SOLN
INTRAMUSCULAR | Status: DC | PRN
  Administered 2024-03-06: 4 mg via INTRAVENOUS

## 2024-03-06 MED ORDER — TRANEXAMIC ACID-NACL 1000-0.7 MG/100ML-% IV SOLN
1000.0000 mg | INTRAVENOUS | Status: AC
  Administered 2024-03-06 (×2): 1000 mg via INTRAVENOUS

## 2024-03-06 MED ORDER — CEFAZOLIN SODIUM-DEXTROSE 2-4 GM/100ML-% IV SOLN
2.0000 g | INTRAVENOUS | Status: AC
  Administered 2024-03-06: 2 g via INTRAVENOUS

## 2024-03-06 SURGICAL SUPPLY — 57 items
BLADE PATELLA REAM PILOT HOLE (MISCELLANEOUS) IMPLANT
BLADE SAW 90X13X1.19 OSCILLAT (BLADE) IMPLANT
BLADE SAW SAG 25X90X1.19 (BLADE) ×1 IMPLANT
BLADE SAW SAG 29X58X.64 (BLADE) ×1 IMPLANT
BNDG ELASTIC 6INX 5YD STR LF (GAUZE/BANDAGES/DRESSINGS) ×1 IMPLANT
BOWL CEMENT MIX W/ADAPTER (MISCELLANEOUS) IMPLANT
BRUSH SCRUB EZ PLAIN DRY (MISCELLANEOUS) IMPLANT
CHLORAPREP W/TINT 26 (MISCELLANEOUS) ×2 IMPLANT
COMPONENT FEM PS KNEE NRW 7 RT (Joint) IMPLANT
COMPONENT PATELLA PEG 3 32 (Joint) IMPLANT
COMPONET TIB PS KNEE 0D E RT (Joint) IMPLANT
COOLER ICEMAN CLASSIC (MISCELLANEOUS) ×1 IMPLANT
CUFF TRNQT CYL 24X4X16.5-23 (TOURNIQUET CUFF) IMPLANT
CUFF TRNQT CYL 30X4X21-28X (TOURNIQUET CUFF) IMPLANT
DERMABOND ADVANCED .7 DNX12 (GAUZE/BANDAGES/DRESSINGS) ×1 IMPLANT
DRAPE SHEET LG 3/4 BI-LAMINATE (DRAPES) ×2 IMPLANT
DRSG MEPILEX SACRM 8.7X9.8 (GAUZE/BANDAGES/DRESSINGS) ×1 IMPLANT
DRSG OPSITE POSTOP 4X10 (GAUZE/BANDAGES/DRESSINGS) IMPLANT
DRSG OPSITE POSTOP 4X8 (GAUZE/BANDAGES/DRESSINGS) IMPLANT
ELECTRODE REM PT RTRN 9FT ADLT (ELECTROSURGICAL) ×1 IMPLANT
GLOVE BIO SURGEON STRL SZ8 (GLOVE) ×1 IMPLANT
GLOVE BIOGEL PI IND STRL 8 (GLOVE) ×1 IMPLANT
GLOVE PI ORTHO PRO STRL 7.5 (GLOVE) ×2 IMPLANT
GLOVE PI ORTHO PRO STRL SZ8 (GLOVE) ×2 IMPLANT
GLOVE SURG SYN 7.5 PF PI (GLOVE) ×1 IMPLANT
GOWN SRG XL LONG LVL 3 NONREIN (GOWNS) ×1 IMPLANT
GOWN SRG XL LVL 3 NONREINFORCE (GOWNS) ×1 IMPLANT
GOWN STRL REUS W/ TWL LRG LVL3 (GOWN DISPOSABLE) ×1 IMPLANT
HOOD PEEL AWAY T7 (MISCELLANEOUS) ×2 IMPLANT
INSERT TIB ARTISURF SZ6-7 R 11 (Joint) IMPLANT
KIT TURNOVER KIT A (KITS) ×1 IMPLANT
MANIFOLD NEPTUNE II (INSTRUMENTS) ×1 IMPLANT
MARKER SKIN DUAL TIP RULER LAB (MISCELLANEOUS) ×1 IMPLANT
MAT ABSORB FLUID 56X50 GRAY (MISCELLANEOUS) ×1 IMPLANT
NDL HYPO 21X1.5 SAFETY (NEEDLE) ×1 IMPLANT
NEEDLE HYPO 21X1.5 SAFETY (NEEDLE) ×1 IMPLANT
PACK TOTAL KNEE (MISCELLANEOUS) ×1 IMPLANT
PAD ARMBOARD POSITIONER FOAM (MISCELLANEOUS) ×3 IMPLANT
PAD COLD UNI WRAP-ON (PAD) ×1 IMPLANT
PENCIL SMOKE EVACUATOR (MISCELLANEOUS) ×1 IMPLANT
PIN DRILL HDLS TROCAR 75 4PK (PIN) IMPLANT
SCREW FEMALE HEX FIX 25X2.5 (ORTHOPEDIC DISPOSABLE SUPPLIES) IMPLANT
SCREW HEX HEADED 3.5X27 DISP (ORTHOPEDIC DISPOSABLE SUPPLIES) IMPLANT
SLEEVE SCD COMPRESS KNEE MED (STOCKING) ×1 IMPLANT
SOL .9 NS 3000ML IRR UROMATIC (IV SOLUTION) ×1 IMPLANT
SOLN STERILE WATER BTL 1000 ML (IV SOLUTION) ×1 IMPLANT
SOLUTION IRRIG SURGIPHOR (IV SOLUTION) ×1 IMPLANT
STOCKINETTE IMPERV 14X48 (MISCELLANEOUS) ×1 IMPLANT
SUT STRATAFIX 14 PDO 36 VLT (SUTURE) ×1 IMPLANT
SUT VIC AB 0 CT1 36 (SUTURE) ×1 IMPLANT
SUT VIC AB 2-0 CT2 27 (SUTURE) ×2 IMPLANT
SUTURE STRATA SPIR 4-0 18 (SUTURE) ×1 IMPLANT
SUTURE VICRYL 1-0 27IN ABS (SUTURE) ×1 IMPLANT
SYR 20ML LL LF (SYRINGE) ×2 IMPLANT
TAPE CLOTH 3X10 WHT NS LF (GAUZE/BANDAGES/DRESSINGS) ×1 IMPLANT
TIP FAN IRRIG PULSAVAC PLUS (DISPOSABLE) ×1 IMPLANT
TRAP FLUID SMOKE EVACUATOR (MISCELLANEOUS) ×1 IMPLANT

## 2024-03-06 NOTE — Anesthesia Procedure Notes (Addendum)
 Spinal  Patient location during procedure: OR Start time: 03/06/2024 1:20 PM End time: 03/06/2024 1:25 PM Reason for block: surgical anesthesia Staffing Performed: resident/CRNA  Resident/CRNA: Bernice Waddell POUR, RN Performed by: Bernice Waddell POUR, RN Authorized by: Dario Barter, MD   Preanesthetic Checklist Completed: patient identified, IV checked, site marked, risks and benefits discussed, surgical consent, monitors and equipment checked, pre-op evaluation and timeout performed Spinal Block Patient position: sitting Prep: DuraPrep Patient monitoring: heart rate, cardiac monitor, continuous pulse ox and blood pressure Approach: midline Location: L3-4 Injection technique: single-shot Needle Needle type: Pencan  Needle gauge: 24 G Needle length: 9 cm Assessment Sensory level: T4 Events: CSF return Additional Notes Patient sitting at edge of the bed. Back prepped with duraprep. Sterile technique used. Free CSF flow observed with one attempt. Patient tolerated procedure well. All vital signs stable.

## 2024-03-06 NOTE — Interval H&P Note (Signed)
Patient history and physical updated. Consent reviewed including risks, benefits, and alternatives to surgery. Patient agrees with above plan to proceed with right total knee arthroplasty  

## 2024-03-06 NOTE — H&P (Signed)
 History of Present Illness: The patient is an 53 y.o. female seen in clinic today for history and physical for right total knee arthroplasty with Dr. Lorelle on 03/06/2024. Patient has had years of increasing right knee pain. She has had cortisone injections with diminishing results. She underwent a arthroscopic knee surgery for partial medial meniscectomy and had continued pain and discomfort throughout the knee following the surgery with arthroscopic findings showing significant cartilage loss throughout the knee. Her pain is severe located along the medial joint line. Pain is sharp and throbbing of with aching at nighttime. She has constant effusion. She has tried Tylenol , NSAIDs with little relief. Unable to take NSAIDs now due to kidney function. Her pain interferes with quality life and activities daily. Patient is a known smoker with A1c of 6.1 and a BMI of 40  Past Medical History: Past Medical History:  Diagnosis Date  Allergy   Anxiety  Bartholin's gland abscess  COVID-19  Hyperlipidemia  Hypertension  Urinary incontinence   Past Surgical History: Past Surgical History:  Procedure Laterality Date  XI ROBOTIC ASSISTED BILATERAL SALPINGO OOPHORECTOM Bilateral 07/08/2023  REMOVAL OF ADNEXAL MASS 07/08/2023  TUBAL LIGATION   Past Family History: No family history on file.  Medications: Current Outpatient Medications  Medication Sig Dispense Refill  acetaminophen  (TYLENOL ) 650 MG ER tablet Take 650 mg by mouth every 8 (eight) hours as needed (Patient not taking: Reported on 07/26/2023)  albuterol  90 mcg/actuation inhaler 2 puffs q.i.d. p.r.n. short of breath, wheezing, or cough (Patient not taking: Reported on 12/13/2020) 1 Inhaler 0  ALPRAZolam (XANAX) 1 MG tablet Take 1 mg by mouth as needed for Sleep. (Patient not taking: Reported on 12/13/2020)  amLODIPine (NORVASC) 10 MG tablet (Patient not taking: Reported on 07/26/2023)  atorvastatin  (LIPITOR) 40 MG tablet Take 40 mg by mouth  once daily.  b complex vitamins capsule Take 1 capsule by mouth once daily  buPROPion (WELLBUTRIN XL) 300 MG XL tablet Take 300 mg by mouth once daily. (Patient not taking: Reported on 12/13/2020)  cetirizine (ZYRTEC) 10 MG tablet Take 10 mg by mouth once daily.  cyclobenzaprine  (FLEXERIL ) 10 MG tablet Take 10 mg by mouth 2 (two) times daily as needed  desvenlafaxine succinate (PRISTIQ) 50 MG ER tablet Take 50 mg by mouth once daily. (Patient not taking: Reported on 12/13/2020)  divalproex  (DEPAKOTE  ER) 250 MG ER tablet Take 250 mg by mouth  divalproex  (DEPAKOTE  ER) 500 MG ER tablet Take 500 mg by mouth  docusate (COLACE) 100 MG capsule Take 100 mg by mouth 2 (two) times daily  DULoxetine  (CYMBALTA ) 20 MG DR capsule Take 20 mg by mouth 2 (two) times daily  ezetimibe  (ZETIA ) 10 mg tablet Take 1 tablet by mouth once daily  gabapentin  (NEURONTIN ) 300 MG capsule Take 300 mg by mouth (Patient not taking: Reported on 07/26/2023)  hydroCHLOROthiazide  (HYDRODIURIL ) 25 MG tablet Take 1 tablet by mouth once daily  hydrocodone -chlorpheniramine (TUSSIONEX) 10-8 mg/5 mL ER suspension Take 5 mLs by mouth every 12 (twelve) hours as needed for Cough. (Patient not taking: Reported on 05/19/2017) 120 mL 0  hydrOXYzine  (ATARAX ) 25 MG tablet Take 25 mg by mouth 3 (three) times daily as needed  ibuprofen  (MOTRIN ) 200 MG tablet Take 600 mg by mouth (Patient not taking: Reported on 07/26/2023)  irbesartan  (AVAPRO ) 300 MG tablet Take 1 tablet by mouth once daily  lidocaine -prilocaine (EMLA) cream Apply topically as needed To affected areas (Patient not taking: Reported on 07/26/2023) 30 g 1  metoprolol  succinate (TOPROL -XL) 50  MG XL tablet Take 50 mg by mouth once daily.  multivitamin with B complex-vitamin C (FARBEE WITH C) tablet Take 1 tablet by mouth every morning  omega-3-dha-epa-fish oil 300-1,000 mg capsule Take 2 g by mouth once daily  oxyCODONE  (ROXICODONE ) 5 MG immediate release tablet Take 5 mg by mouth (Patient  not taking: Reported on 07/26/2023)  potassium chloride  (KLOR-CON ) 10 MEQ ER tablet Take 10 mEq by mouth 2 (two) times daily  predniSONE (DELTASONE) 10 MG tablet 6 PO Q D X 1 DAY, THEN 5 PO Q D X 1 DAY, THEN 4 PO Q D X 1 DAY, THEN 3 PO Q D X 1 DAY, THEN 2 PO Q D X 1 DAY, THEN 1 PO Q D X 1 DAY (Patient not taking: Reported on 05/19/2017) 21 tablet 0  tobramycin (TOBREX) 0.3 % ophthalmic solution  traZODone  (DESYREL ) 50 MG tablet Take 50 mg by mouth at bedtime as needed  valsartan-hydrochlorothiazide  (DIOVAN-HCT) 320-25 mg tablet Take 1 tablet by mouth once daily. (Patient not taking: Reported on 05/19/2023)   No current facility-administered medications for this visit.   Allergies: Allergies  Allergen Reactions  Amlodipine Swelling  Buspirone Other (See Comments)  Optical migraines  Lisinopril Other (See Comments)  Morphine Other (See Comments)  tried to take her face off due to itching  Sulfa (Sulfonamide Antibiotics) Other (See Comments)  Pass out    Visit Vitals: There were no vitals filed for this visit.   Review of Systems:  A comprehensive 14 point ROS was performed, reviewed, and the pertinent orthopaedic findings are documented in the HPI.  Physical Exam: General:  Well developed, well nourished, no apparent distress, normal affect, normal gait with no antalgic component.   HEENT: Head normocephalic, atraumatic, PERRL.   Abdomen: Soft, non tender, non distended, Bowel sounds present.  Heart: Examination of the heart reveals regular, rate, and rhythm. There is no murmur noted on ascultation. There is a normal apical pulse.  Lungs: Lungs are clear to auscultation. There is no wheeze, rhonchi, or crackles. There is normal expansion of bilateral chest walls.   Comprehensive Knee Exam: Gait Antalgic and slowed on the right  Alignment Neutral   Inspection Right  Skin Normal appearance with no obvious deformity. Healed arthroscopic portals. No ecchymosis or erythema.   Soft Tissue Diffuse swelling around the knee nonfocal  Quad Atrophy None   Palpation  Right  Tenderness Medial joint line parapatellar tenderness to palpation  Crepitus No patellofemoral or tibiofemoral crepitus  Effusion Large   Range of Motion Right  Flexion 0-105  Extension Full knee extension without hyperextension   Ligamentous Exam Right  Lachman 2+ with soft endpoint  Valgus 0 Normal  Valgus 30 Normal  Varus 0 Normal  Varus 30 Normal  Anterior Drawer 2+ with soft endpoint  Posterior Drawer Normal   Meniscal Exam Right  Hyperflexion Test Positive  Hyperextension Test Positive  McMurray's Positive   Neurovascular Right  Quadriceps Strength 5/5  Hamstring Strength 5/5  Hip Abductor Strength 4/5  Distal Motor Normal  Distal Sensory Normal light touch sensation  Distal Pulses Normal    Imaging Studies: I have reviewed AP, lateral,sunrise, and flexed PA weight bearing knee X-rays (4 views) of the right knee that show moderate degenerative changes of the right knee with medial joint space narrowing with bone-on-bone articulation early osteophyte formation and lateral subluxation of the tibia relative to the femur. There is also patellofemoral narrowing with sclerosis and early osteophyte formation as well. Kellgren-Lawrence grade  3. AP, sunrise, and flexed PA of the left knee also show mild to moderate degenerative changes most significant in the patellofemoral joint with sclerosis and joint space narrowing as well as some mild medial joint space narrowing and medial femoral osteophyte formation. No fractures dislocations in either knee.   Arthroscopic images from recent right knee arthroscopy reviewed showing grade 4 changes to the medial femoral condyle complete loss of cartilage. Grade 3 changes to the patellofemoral joint. The ACL appears torn and denuded with scarring at the inferior portion to the medial wall no intact fibers noted in the images presented. There is  also complex medial meniscus tear and fraying on the lateral meniscus and images showing post partial medial meniscectomy with an intact root.  Assessment:  ICD-10-CM  1. Primary osteoarthritis of right knee M17.11    Plan: Tashia is a 53 year old female with advanced right knee osteoarthritis. She has had severe debilitating pain mostly along the medial compartment of the right knee where x-rays show complete loss of joint space in the medial compartment. Her pain interferes with quality of life and activities day living. She has had diminishing results with conservative treatment. Risks, benefits, complications of a right total knee arthroplasty and discussed with the patient. Patient has agreed to consent the procedure with Dr. Lorelle on 03/06/2024.  The hospitalization and post-operative care and rehabilitation were also discussed. The use of perioperative antibiotics and DVT prophylaxis were discussed. The risk, benefits and alternatives to a surgical intervention were discussed at length with the patient. The patient was also advised of risks related to the medical comorbidities and elevated body mass index (BMI). A lengthy discussion took place to review the most common complications including but not limited to: stiffness, loss of function, complex regional pain syndrome, deep vein thrombosis, pulmonary embolus, heart attack, stroke, infection, wound breakdown, numbness, intraoperative fracture, damage to nerves, tendon,muscles, arteries or other blood vessels, death and other possible complications from anesthesia. The patient was told that we will take steps to minimize these risks by using sterile technique, antibiotics and DVT prophylaxis when appropriate and follow the patient postoperatively in the office setting to monitor progress. The possibility of recurrent pain, no improvement in pain and actual worsening of pain were also discussed with the patient.   All questions answered  patient agrees with plan for right total knee arthroplasty, reviewed risks given young age of possible revision and plan for likely press fit implants.

## 2024-03-06 NOTE — Plan of Care (Signed)
Educated

## 2024-03-06 NOTE — Anesthesia Preprocedure Evaluation (Signed)
 Anesthesia Evaluation  Patient identified by MRN, date of birth, ID band Patient awake    Reviewed: Allergy  & Precautions, NPO status , Patient's Chart, lab work & pertinent test results  History of Anesthesia Complications Negative for: history of anesthetic complications  Airway Mallampati: II   Neck ROM: Full    Dental  (+) Upper Dentures, Missing, Dental Advidsory Given Permanent bridge on bottom right:   Pulmonary neg shortness of breath, neg sleep apnea, COPD, neg recent URI, former smoker   Pulmonary exam normal breath sounds clear to auscultation       Cardiovascular hypertension, (-) angina + CAD  (-) Past MI and (-) Cardiac Stents Normal cardiovascular exam+ dysrhythmias (RBBB) (-) Valvular Problems/Murmurs Rhythm:Regular Rate:Normal  ECG 05/17/23: T wave abn  Otherwise NSR  Echo 05/26/23:  1. Left ventricular ejection fraction, by estimation, is 55 to 60%. The left ventricle has normal function. The left ventricle has no regional wall motion abnormalities. Left ventricular diastolic parameters are consistent with Grade I diastolic dysfunction (impaired relaxation). The average left ventricular global longitudinal strain is -20.7 %. The global longitudinal strain is normal.  2. Right ventricular systolic function is normal. The right ventricular size is normal.  3. The mitral valve is normal in structure. Trivial mitral valve regurgitation. No evidence of mitral stenosis.  4. The aortic valve is tricuspid. There is mild calcification of the aortic valve. Aortic valve regurgitation is trivial. Aortic valve sclerosis/calcification is present, without any evidence of aortic stenosis.  5. The inferior vena cava is normal in size with greater than 50% respiratory variability, suggesting right atrial pressure of 3 mmHg.   Neuro/Psych  PSYCHIATRIC DISORDERS (PTSD) Anxiety Depression    negative neurological ROS      GI/Hepatic negative GI ROS,,,  Endo/Other  diabetes  Obesity   Renal/GU negative Renal ROS     Musculoskeletal   Abdominal   Peds  Hematology negative hematology ROS (+)   Anesthesia Other Findings Reviewed and agree with Dorise Boor pre-anesthesia clinical review note.    Cardiology phone note 06/30/23:  Chart reviewed as part of pre-operative protocol coverage.  Per Dr. Alveta, recent echo shows normal LV systolic function with grade I DD, Trivial valvular disease. She is at low risk for her upcoming GYN surgery. PN    Additionally, recent coronary CTA showed nonobstructive CAD.    Therefore, given past medical history and time since last visit, based on ACC/AHA guidelines, Sandra Montes is at acceptable risk for the planned procedure without further cardiovascular testing.    Cardiology note 05/25/23:  1.  Abnormal EKG: She has an incomplete right bundle branch block with T wave inversions in the anterolateral leads.  Will get an echocardiogram to assess her LV function.  She is not having any episodes of angina.  She does have some shortness of breath but this could be due to deconditioning and her recent 40 pound weight gain.   She needs preoperative clearance for ovarian surgery.  If her echocardiogram is unremarkable we should be able to clear her fairly easily for her ovarian surgery.   2.  History of hyperlipidemia: She also has a strong family history of premature coronary artery disease.  Will get a coronary calcium  score for further evaluation.  She is currently on atorvastatin .  Her last LDL is 84.    Will check LP(a) in 2 weeks    3.  Hypertension: She is currently on hydrochlorothiazide  and irbesartan .  Blood pressure is well-controlled.  She is  hypokalemic.  Will add potassium chloride  and recheck levels in several weeks.   Reproductive/Obstetrics                              Anesthesia Physical Anesthesia Plan  ASA:  2  Anesthesia Plan: Spinal   Post-op Pain Management:    Induction:   PONV Risk Score and Plan: 3 and Treatment may vary due to age or medical condition, Propofol  infusion and TIVA  Airway Management Planned: Natural Airway and Nasal Cannula  Additional Equipment:   Intra-op Plan:   Post-operative Plan: Extubation in OR  Informed Consent: I have reviewed the patients History and Physical, chart, labs and discussed the procedure including the risks, benefits and alternatives for the proposed anesthesia with the patient or authorized representative who has indicated his/her understanding and acceptance.     Dental advisory given  Plan Discussed with: CRNA  Anesthesia Plan Comments: (Patient consented for risks of anesthesia including but not limited to:  - adverse reactions to medications - damage to eyes, teeth, lips or other oral mucosa - nerve damage due to positioning  - sore throat or hoarseness - damage to heart, brain, nerves, lungs, other parts of body or loss of life  Informed patient about role of CRNA in peri- and intra-operative care.  Patient voiced understanding.)         Anesthesia Quick Evaluation

## 2024-03-06 NOTE — Transfer of Care (Signed)
 Immediate Anesthesia Transfer of Care Note  Patient: Sandra Montes  Procedure(s) Performed: ARTHROPLASTY, KNEE, TOTAL (Right: Knee)  Patient Location: PACU  Anesthesia Type:General  Level of Consciousness: awake, alert , oriented, and patient cooperative  Airway & Oxygen Therapy: Patient Spontanous Breathing and Patient connected to face mask oxygen  Post-op Assessment: Report given to RN and Post -op Vital signs reviewed and stable  Post vital signs: stable  Last Vitals:  Vitals Value Taken Time  BP    Temp    Pulse    Resp    SpO2      Last Pain:  Vitals:   03/06/24 1139  TempSrc: Temporal  PainSc: 4          Complications: No notable events documented.

## 2024-03-06 NOTE — Discharge Instructions (Signed)
 Instructions after Total Knee Replacement   Sandra Montes M.D.     Dept. of Orthopaedics & Sports Medicine  [facility]  [address]  [place], [place]  [address]  Phone: [phone]   Fax: [phone]    DIET: Drink plenty of non-alcoholic fluids. Resume your normal diet. Include foods high in fiber.  ACTIVITY:  You may use crutches or a walker with weight-bearing as tolerated, unless instructed otherwise. You may be weaned off of the walker or crutches by your Physical Therapist.  Do NOT place pillows under the knee. Anything placed under the knee could limit your ability to straighten the knee.   Continue doing gentle exercises. Exercising will reduce the pain and swelling, increase motion, and prevent muscle weakness.   Please continue to use the TED compression stockings for 2 weeks. You may remove the stockings at night, but should reapply them in the morning. Do not drive or operate any equipment until instructed.  WOUND CARE:  Continue to use the PolarCare or ice packs periodically to reduce pain and swelling. You may begin showering 3 days after surgery with honeycomb dressing. Remove honeycomb dressing 7 days after surgery and continue showering. Allow dermabond to fall off on its own.  MEDICATIONS: You may resume your regular medications. Please take the pain medication as prescribed on the medication. Do not take pain medication on an empty stomach. You have been given a prescription for a blood thinner (Lovenox or Coumadin). Please take the medication as instructed. (NOTE: After completing a 2 week course of Lovenox, take one 81 mg Enteric-coated aspirin twice a day for 3 additional weeks. This along with elevation will help reduce the possibility of phlebitis in your operated leg.) Do not drive or drink alcoholic beverages when taking pain medications.  POSTOPERATIVE CONSTIPATION PROTOCOL Constipation - defined medically as fewer than three stools per  week and severe constipation as less than one stool per week.  One of the most common issues patients have following surgery is constipation.  Even if you have a regular bowel pattern at home, your normal regimen is likely to be disrupted due to multiple reasons following surgery.  Combination of anesthesia, postoperative narcotics, change in appetite and fluid intake all can affect your bowels.  In order to avoid complications following surgery, here are some recommendations in order to help you during your recovery period.  Colace (docusate) - Pick up an over-the-counter form of Colace or another stool softener and take twice a day as long as you are requiring postoperative pain medications.  Take with a full glass of water daily.  If you experience loose stools or diarrhea, hold the colace until you stool forms back up.  If your symptoms do not get better within 1 week or if they get worse, check with your doctor.  Dulcolax (bisacodyl) - Pick up over-the-counter and take as directed by the product packaging as needed to assist with the movement of your bowels.  Take with a full glass of water.  Use this product as needed if not relieved by Colace only.   MiraLax (polyethylene glycol) - Pick up over-the-counter to have on hand.  MiraLax is a solution that will increase the amount of water in your bowels to assist with bowel movements.  Take as directed and can mix with a glass of water, juice, soda, coffee, or tea.  Take if you go more than two days without a movement. Do not use MiraLax more than once per day.  Call your doctor if you are still constipated or irregular after using this medication for 7 days in a row.  If you continue to have problems with postoperative constipation, please contact the office for further assistance and recommendations.  If you experience "the worst abdominal pain ever" or develop nausea or vomiting, please contact the office immediatly for further recommendations for  treatment.   CALL THE OFFICE FOR: Temperature above 101 degrees Excessive bleeding or drainage on the dressing. Excessive swelling, coldness, or paleness of the toes. Persistent nausea and vomiting.  FOLLOW-UP:  You should have an appointment to return to the office in 14 days after surgery. Arrangements have been made for continuation of Physical Therapy (either home therapy or outpatient therapy).

## 2024-03-06 NOTE — Op Note (Signed)
 Patient Name: Sandra Montes  FMW:[id number]  Pre-Operative Diagnosis: Right knee Osteoarthritis  Post-Operative Diagnosis: (same)  Procedure: Right Total Knee Arthroplasty  Components/Implants: Femur: Persona Size 7 Narrow CR PPS   Tibia: Persona Size E OsseoTi  Poly: 11mm MC  Patella: 32x1mm symmetric OsseoTi  Femoral Valgus Cut Angle: 5 degrees  Distal Femoral Re-cut: none  Patella Resurfacing: yes   Date of Surgery: 03/06/2024  Surgeon: Arthea Sheer MD  Assistant: Debby Amber PA (present and scrubbed throughout the case, critical for assistance with exposure, retraction, instrumentation, and closure)   Anesthesiologist: Dario  Anesthesia: Spinal   Tourniquet Time: 54 min  EBL: 25cc  IVF: 900cc  Complications: None   Brief history: The patient is a 53 year old female with a history of osteoarthritis of the right knee with pain limiting their range of motion and activities of daily living, which has failed multiple attempts at conservative therapy.  The risks and benefits of total knee arthroplasty as definitive surgical treatment were discussed with the patient, who opted to proceed with the operation.  After outpatient medical clearance and optimization was completed the patient was admitted to [facility] for the procedure.  All preoperative films were reviewed and an appropriate surgical plan was made prior to surgery. Preoperative range of motion was 0 to 115  Description of procedure: The patient was brought to the operating room where laterality was confirmed by all those present to be the right side.   Spinal anesthesia was administered and the patient received an intravenous dose of antibiotics for surgical prophylaxis and a dose of tranexamic acid.  Patient is positioned supine on the operating room table with all bony prominences well-padded.  A well-padded tourniquet was applied to the right thigh.  The knee was then prepped and  draped in usual sterile fashion with multiple layers of adhesive and nonadhesive drapes.  All of those present in the operating room participated in a surgical timeout laterality and patient were confirmed.   An Esmarch was wrapped around the extremity and the leg was elevated and the knee flexed.  The tourniquet was inflated to a pressure of 250 mmHg. The Esmarch was removed and the leg was brought down to full extension.  The patella and tibial tubercle identified and outlined using a marking pen and a midline skin incision was made with a knife carried through the subcutaneous tissue down to the extensor retinaculum.  After exposure of the extensor mechanism the medial parapatellar arthrotomy was performed with a scalpel and electrocautery extending down medial and distal to the tibial tubercle taking care to avoid incising the patellar tendon.   A standard medial release was performed over the proximal tibia.  The knee was brought into extension in order to excise the fat pad taking care not to damage the patella tendon.  The superior soft tissue was removed from the anterior surface of the distal femur to visualize for the procedure.  The knee was then brought into flexion with the patella subluxed laterally and subluxing the tibia anteriorly.  The ACL was transected and removed with electrocautery and additional soft tissue was removed from the proximal surface of the tibia to fully expose. The PCL was found to be intact and was preserved.  An extramedullary tibial cutting guide was then applied to the leg with a spring-loaded ankle clamp placed around the distal tibia just above the malleoli the angulation of the guide was adjusted to give some posterior slope in the tibial resection with an  appropriate varus/valgus alignment.  The resection guide was then pinned to the proximal tibia and the proximal tibial surface was resected with an oscillating saw.  Careful attention was paid to ensure the blade did  not disrupt any of the soft tissues including any lateral or medial ligament.  Attention was then turned to the femur, with the knee slightly flexed a opening drill was used to enter the medullary canal of the femur.  After removing the drill marrow was suctioned out to decompress the distal femur.  An intramedullary femoral guide was then inserted into the drill hole and the alignment guide was seated firmly against the distal end of the medial femoral condyle.  The distal femoral cutting guide was then attached and pinned securely to the anterior surface of the femur and the intramedullary rod and alignment guide was removed.  Distal femur resection was then performed with an oscillating saw with retractors protecting medial and laterally.   The distal cutting block was then removed and the extension gap was checked with a spacer.  Extension gap was found to be appropriately sized to accommodate the spacer block.   The femoral sizing guide was then placed securely into the posterior condyles of the femur and the femoral size was measured and determined to be 7.  The size 7; 4-in-1 cutting guide was placed in position and secured with 2 pins.  The anterior posterior and chamfer resections were then performed with an oscillating saw.  Bony fragments and osteophytes were then removed.  Using a lamina spreader the posterior medial and lateral condyles were checked for additional osteophytes and posterior soft tissue remnants.  Any remaining meniscus was removed at this time.  Periarticular injection was performed in the meniscal rims and posterior capsule with aspiration performed to ensure no intravascular injection.   The tibia was then exposed and the tibial trial was pinned onto the plateau after confirming appropriate orientation and rotation.  Using the drill bushing the tibia was prepared to the appropriate drill depth.  Tibial broach impactor was then driven through the punch guide using a mallet.  The  femoral trial component was then inserted onto the femur.  A trial tibial polyethylene bearing was then placed and the knee was reduced.  The knee achieved full extension with no hyperextension and was found to be balanced in flexion and extension with the trials in place.  The knee was then brought into full extension the patella was everted and held with 2 Kocher clamps.  The articular surface of the patella was then resected with an patella reamer and saw after careful measurement with a caliper.  The patella was then prepared with the drill guide and a trial patella was placed.  The knee was then taken through range of motion and it was found that the patella articulated appropriately with the trochlea and good patellofemoral motion without subluxation.    The correct final components for implantation were confirmed and opened by the circulator nurse.  The knee was irrigated with normal saline via pulsatile lavage to remove any bony debris or soft tissue.  The prepared surface of the tibia was exposed and the tibial component was implanted with good bony contact.  The femoral component was then placed and impacted showing good coverage and a good snug fit.  The patella was then cleared off and the patella compression tool was used to apply the patellar component with symmetric compression onto the patella.  The tibial component was then irrigated and  cleared of any debris and a real polyethylene component was placed and engaged with the locking mechanism.  The knee was then injected with the particular cocktail.  The knee was taken through range of motion and found to be stable in flexion and extension with patellar tracking.  The knee was then irrigated with copious amount of normal saline via pulsatile lavage to remove all loose bodies and other debris.  The knee was then irrigated with surgiphor betadine  based wash and reirrigated with saline.  The tourniquet was then dropped and all bleeding vessels were  identified and coagulated.  The arthrotomy was approximated with #1 Vicryl and closed with #1 Stratafix suture.  The knee was brought into slight flexion and the subcutaneous tissues were closed with 0 Vicryl, 2-0 Vicryl and a running subcuticular 4-0 stratafix barbed suture.  Skin was then glued with Dermabond.  A sterile adhesive dressing was then placed along with a sequential compression device to the calf, a Ted stocking, and a cryotherapy cuff.   Sponge, needle, and Lap counts were all correct at the end of the case.   The patient was transferred off of the operating room table to a hospital bed, good pulses were found distally on the operative side.  The patient was transferred to the recovery room in stable condition.

## 2024-03-06 NOTE — Progress Notes (Signed)
 Patient is not able to walk the distance required to go the bathroom, or she is unable to safely negotiate stairs required to access the bathroom.  A 3in1 BSC will alleviate this problem.       Lollie Marrow, PA-C [facility]

## 2024-03-07 ENCOUNTER — Encounter: Payer: Self-pay | Admitting: Orthopedic Surgery

## 2024-03-07 ENCOUNTER — Other Ambulatory Visit: Payer: Self-pay

## 2024-03-07 DIAGNOSIS — M1711 Unilateral primary osteoarthritis, right knee: Secondary | ICD-10-CM | POA: Diagnosis not present

## 2024-03-07 LAB — BASIC METABOLIC PANEL WITH GFR
Anion gap: 10 (ref 5–15)
BUN: 11 mg/dL (ref 6–20)
CO2: 27 mmol/L (ref 22–32)
Calcium: 8.4 mg/dL — ABNORMAL LOW (ref 8.9–10.3)
Chloride: 104 mmol/L (ref 98–111)
Creatinine, Ser: 0.7 mg/dL (ref 0.44–1.00)
GFR, Estimated: >60 mL/min (ref >60)
Glucose, Bld: 159 mg/dL — ABNORMAL HIGH (ref 70–99)
Potassium: 3.8 mmol/L (ref 3.5–5.1)
Sodium: 141 mmol/L (ref 135–145)

## 2024-03-07 LAB — CBC
HCT: 37.1 % (ref 36.0–46.0)
Hemoglobin: 13.3 g/dL (ref 12.0–15.0)
MCH: 30 pg (ref 26.0–34.0)
MCHC: 35.8 g/dL (ref 30.0–36.0)
MCV: 83.7 fL (ref 80.0–100.0)
Platelets: 182 K/uL (ref 150–400)
RBC: 4.43 MIL/uL (ref 3.87–5.11)
RDW: 12.8 % (ref 11.5–15.5)
WBC: 16.8 K/uL — ABNORMAL HIGH (ref 4.0–10.5)
nRBC: 0 % (ref 0.0–0.2)

## 2024-03-07 MED ORDER — EZETIMIBE 10 MG PO TABS
ORAL_TABLET | ORAL | Status: AC
  Filled 2024-03-07: qty 1

## 2024-03-07 MED ORDER — DOCUSATE SODIUM 100 MG PO CAPS
100.0000 mg | ORAL_CAPSULE | Freq: Two times a day (BID) | ORAL | 0 refills | Status: AC
  Filled 2024-03-07: qty 10, 5d supply, fill #0

## 2024-03-07 MED ORDER — CELECOXIB 200 MG PO CAPS
200.0000 mg | ORAL_CAPSULE | Freq: Two times a day (BID) | ORAL | 0 refills | Status: AC
  Filled 2024-03-07: qty 28, 14d supply, fill #0

## 2024-03-07 MED ORDER — ENOXAPARIN SODIUM 40 MG/0.4ML IJ SOSY
40.0000 mg | PREFILLED_SYRINGE | INTRAMUSCULAR | 0 refills | Status: DC
  Filled 2024-03-07: qty 5.6, 14d supply, fill #0

## 2024-03-07 MED ORDER — ONDANSETRON HCL 4 MG PO TABS
4.0000 mg | ORAL_TABLET | Freq: Four times a day (QID) | ORAL | 0 refills | Status: DC | PRN
  Filled 2024-03-07: qty 20, 5d supply, fill #0

## 2024-03-07 MED ORDER — OXYCODONE HCL 5 MG PO TABS
2.5000 mg | ORAL_TABLET | Freq: Three times a day (TID) | ORAL | 0 refills | Status: AC | PRN
  Filled 2024-03-07: qty 20, 7d supply, fill #0

## 2024-03-07 MED ORDER — ACETAMINOPHEN 500 MG PO TABS
1000.0000 mg | ORAL_TABLET | Freq: Three times a day (TID) | ORAL | 0 refills | Status: AC
  Filled 2024-03-07: qty 30, 5d supply, fill #0

## 2024-03-07 MED ORDER — TRAMADOL HCL 50 MG PO TABS
50.0000 mg | ORAL_TABLET | Freq: Four times a day (QID) | ORAL | 0 refills | Status: AC | PRN
  Filled 2024-03-07: qty 30, 8d supply, fill #0

## 2024-03-07 NOTE — Plan of Care (Signed)

## 2024-03-07 NOTE — Evaluation (Signed)
 Physical Therapy Evaluation Patient Details Name: Sandra Montes MRN: [id number] DOB: 06/14/1970 Today's Date: 03/07/2024  History of Present Illness  Pt is a 53 y.o. female s/p R TKA on 03/06/24.  Clinical Impression  Pt admitted with above diagnosis. Pt currently with functional limitations due to the deficits listed below (see PT Problem List). Pt received upright in bed agreeable to PT services. Reports PTA being independent.   To date, session began with education as listed below. Pain currently 3/10 NPS in R knee. Pt exits bed independently. Spouse assists with donning socks in sitting. Pt requiring VC's for hand placement to stand to RW at supervision level completing ~250' of step through gait with consistent RLE initial contact with heel strike. Pt with safe completion of stair navigation as well to enter home with education to spouse on RW mgmt and support. Pt returns to room in recliner with review of HEP as listed below with good understanding of exercises and R knee positioning. Pt completing all PT goals and safe for d/c recs as listed below. Pt with all needs in reach.        If plan is discharge home, recommend the following: A little help with walking and/or transfers;Assistance with cooking/housework;Assist for transportation;Help with stairs or ramp for entrance   Can travel by private vehicle        Equipment Recommendations Rolling walker (2 wheels);BSC/3in1  Recommendations for Other Services       Functional Status Assessment Patient has had a recent decline in their functional status and demonstrates the ability to make significant improvements in function in a reasonable and predictable amount of time.     Precautions / Restrictions Precautions Precautions: Knee Precaution Booklet Issued: Yes (comment) Recall of Precautions/Restrictions: Intact Restrictions Weight Bearing Restrictions Per Provider Order: Yes RLE Weight Bearing Per Provider Order:  Weight bearing as tolerated      Mobility  Bed Mobility Overal bed mobility: Independent               Patient Response: Cooperative  Transfers Overall transfer level: Modified independent Equipment used: Rolling walker (2 wheels)                    Ambulation/Gait Ambulation/Gait assistance: Supervision Gait Distance (Feet): 250 Feet Assistive device: Rolling walker (2 wheels) Gait Pattern/deviations: Step-through pattern       General Gait Details: consistent step through pattern with heel strike at initial contact  Stairs Stairs: Yes Stairs assistance: Supervision Stair Management: One rail Right, One rail Left, Step to pattern, Forwards Number of Stairs: 4 General stair comments: asc/desc 2x with correct and safe LE sequencing  Wheelchair Mobility     Tilt Bed Tilt Bed Patient Response: Cooperative  Modified Rankin (Stroke Patients Only)       Balance Overall balance assessment: Needs assistance Sitting-balance support: No upper extremity supported, Feet supported Sitting balance-Leahy Scale: Good     Standing balance support: No upper extremity supported Standing balance-Leahy Scale: Good                               Pertinent Vitals/Pain Pain Assessment Pain Assessment: 0-10 Pain Score: 3  Pain Descriptors / Indicators: Aching Pain Intervention(s): Limited activity within patient's tolerance, Monitored during session, Premedicated before session, Repositioned, Ice applied    Home Living Family/patient expects to be discharged to:: Private residence Living Arrangements: Spouse/significant other Available Help at Discharge: Family;Available 24 hours/day Type  of Home: House Home Access: Stairs to enter Entrance Stairs-Rails: Right;Left;Can reach both Entrance Stairs-Number of Steps: 3   Home Layout: One level Home Equipment: Rollator (4 wheels);Cane - single point      Prior Function Prior Level of Function :  Independent/Modified Independent;Working/employed;Driving                     Extremity/Trunk Assessment   Upper Extremity Assessment Upper Extremity Assessment: Overall WFL for tasks assessed    Lower Extremity Assessment Lower Extremity Assessment: RLE deficits/detail RLE Deficits / Details: expected ROM deficits    Cervical / Trunk Assessment Cervical / Trunk Assessment: Normal  Communication   Communication Communication: No apparent difficulties    Cognition Arousal: Alert Behavior During Therapy: WFL for tasks assessed/performed   PT - Cognitive impairments: No apparent impairments                         Following commands: Intact       Cueing Cueing Techniques: Verbal cues     General Comments General comments (skin integrity, edema, etc.): 3-90 degrees R knee ROM    Exercises Total Joint Exercises Ankle Circles/Pumps: AROM, Both, 5 reps, Supine Quad Sets: AROM, Strengthening, Right, 10 reps, Supine Short Arc Quad: AROM, Strengthening, Right, 10 reps, Supine Heel Slides: AROM, Strengthening, Right, 10 reps, Supine Hip ABduction/ADduction: AROM, Strengthening, Right, 10 reps, Supine Straight Leg Raises: AROM, Strengthening, Right, 10 reps, Supine Long Arc Quad: AROM, Strengthening, Right, 10 reps, Supine Knee Flexion: AROM, Strengthening, Right, 5 reps, Supine Goniometric ROM: 3-90 degrees Other Exercises Other Exercises: Role of PT in acute setting, knee positioning, HEP, DME use, stair training, polar care, car transfer   Assessment/Plan    PT Assessment Patient needs continued PT services  PT Problem List Decreased strength;Decreased range of motion       PT Treatment Interventions DME instruction;Gait training;Patient/family education;Stair training;Functional mobility training;Therapeutic activities;Therapeutic exercise;Balance training;Neuromuscular re-education    PT Goals (Current goals can be found in the Care Plan section)   Acute Rehab PT Goals Patient Stated Goal: to go home PT Goal Formulation: With patient Time For Goal Achievement: 03/21/24 Potential to Achieve Goals: Good    Frequency BID     Co-evaluation               AM-PAC PT 6 Clicks Mobility  Outcome Measure Help needed turning from your back to your side while in a flat bed without using bedrails?: None Help needed moving from lying on your back to sitting on the side of a flat bed without using bedrails?: None Help needed moving to and from a bed to a chair (including a wheelchair)?: A Little Help needed standing up from a chair using your arms (e.g., wheelchair or bedside chair)?: A Little Help needed to walk in hospital room?: A Little Help needed climbing 3-5 steps with a railing? : A Little 6 Click Score: 20    End of Session   Activity Tolerance: Patient tolerated treatment well Patient left: in bed;with call bell/phone within reach;with bed alarm set;with family/visitor present Nurse Communication: Mobility status PT Visit Diagnosis: Other abnormalities of gait and mobility (R26.89);Muscle weakness (generalized) (M62.81)    Time: 9159-9092 PT Time Calculation (min) (ACUTE ONLY): 27 min   Charges:   PT Evaluation $PT Eval Low Complexity: 1 Low PT Treatments $Therapeutic Exercise: 8-22 mins PT General Charges $$ ACUTE PT VISIT: 1 Visit  Dorina HERO. Fairly IV, PT, DPT Physical Therapist- [place]  [facility] 03/07/2024, 9:44 AM

## 2024-03-07 NOTE — Discharge Summary (Signed)
 Physician Discharge Summary  Patient ID: Sandra Montes MRN: [id number] DOB/AGE: 1971-02-21 53 y.o.  Admit date: 03/06/2024 Discharge date: 03/07/2024  Admission Diagnoses:  Primary osteoarthritis of right knee [M17.11] Right knee pain, unspecified chronicity [M25.561] S/P total knee arthroplasty, right [Z96.651]   Discharge Diagnoses: Patient Active Problem List   Diagnosis Date Noted   S/P total knee arthroplasty, right 03/06/2024   MDD (major depressive disorder), recurrent episode, moderate (HCC) 12/31/2023   Prediabetes 08/18/2023   History of tobacco abuse 08/18/2023   Proteinuria 06/08/2023   Tear of lateral collateral ligament of right knee 05/17/2023   Acute meniscal tear of knee, right, sequela 05/17/2023   Electrocardiogram showing T wave abnormalities 05/17/2023   DOE (dyspnea on exertion) 05/17/2023   Abnormal weight gain 05/17/2023   PTSD (post-traumatic stress disorder) 03/24/2023   High risk medication use 03/24/2023   History of alcohol abuse 03/24/2023   History of cannabis abuse 03/24/2023   Cessation of tobacco use in previous 12 months 02/11/2023   History of suicidal ideation 01/28/2023   Sleep disorder 12/30/2022   MDD (major depressive disorder), recurrent episode, severe (HCC) 12/17/2022   Obsessional thoughts 10/20/2022   Family history of cardiac arrest 09/07/2022   Erythrocytosis 01/30/2022   Elevated hemoglobin 01/13/2022   History of colon polyps 01/13/2022   Hot flushes, perimenopausal 01/13/2022   Anxiety state 01/13/2022   Irritable bowel syndrome with diarrhea 10/13/2021   Centrilobular emphysema (HCC) 07/04/2021   Non-seasonal allergic rhinitis due to pollen 07/04/2021   Dyslipidemia 12/29/2018   Essential hypertension 12/29/2018    Past Medical History:  Diagnosis Date   Adnexal mass 05/17/2022   a.) CT A/P 05/18/2023: 17.0 x 16.3 x 12.8 cm --> favored to be a large benign fluid filled ovarian cyst --> surgical resection  recommended/pending   Anemia    Anxiety    Aortic atherosclerosis    Bartholin's gland abscess    CAD (coronary artery disease) 06/04/2023   a.) cCTA 06/04/2023: Ca2+ = 450 (99th %'ile) --> 27.6 LAD, 66.1 LCx, 356 RCA   Chronic left shoulder pain    Depression    Diastolic dysfunction 05/26/2023   a.) TTE 05/26/2023: EF 55-60%, no RWMAs, G1DD, triv AR/MR, mild AoC sclerosis without stenosis   DM (diabetes mellitus), type 2 (HCC)    DOE (dyspnea on exertion)    Emphysema lung (HCC)    History of 2019 novel coronavirus disease (COVID-19) 12/13/2020   History of alcohol abuse    History of allergy     History of suicidal ideation 09/14/2022   a.) scared to do it; increasing intrusive thoughts about jumping off a cliff because she is tired of fighting; (+) crying, anxiety, depression, inturrupted sleep, appetite changes related to relationship problems (broke up with BF) and what sounds like caregiver role strain (caregiver to very demanding mother); (+) polysubstance use/abuse   HLD (hyperlipidemia)    Hyperlipidemia    Hypertension    Insomnia    a.) uses trazodone  PRN   Irritable bowel syndrome with diarrhea    Marijuana use    Nephrolithiasis    Osteoarthritis of right knee    Pain in both hands    Polyarthralgia    Positive ANA (antinuclear antibody)    PTSD (post-traumatic stress disorder)    RBBB (right bundle branch block)    Sinus bradycardia    Urinary incontinence    Weight gain due to medication    a.) significant (40lb) weight gain with the initiation of divalproex   Transfusion: none   Consultants (if any):   Discharged Condition: Improved  Hospital Course: Sandra Montes is an 53 y.o. female who was admitted 03/06/2024 with a diagnosis of S/P total knee arthroplasty, right and went to the operating room on 03/06/2024 and underwent the above named procedures.    Surgeries: Procedure(s): ARTHROPLASTY, KNEE, TOTAL on 03/06/2024 Patient tolerated the  surgery well. Taken to PACU where she was stabilized and then transferred to the orthopedic floor.  Started on Lovenox 30 mg q 12 hrs. TEDs and SCDs applied bilaterally. Heels elevated on bed. No evidence of DVT. Negative Homan. Physical therapy started on day #1 for gait training and transfer. OT started day #1 for ADL and assisted devices.  Patient's IV was d/c on day #1. Patient was able to safely and independently complete all PT goals. PT recommending discharge to home.    On post op day #1 patient was stable and ready for discharge to home with HHPT.  Implants: Femur: Persona Size 7 Narrow CR PPS   Tibia: Persona Size E OsseoTi  Poly: 11mm MC  Patella: 32x28mm symmetric OsseoTi    She was given perioperative antibiotics:  Anti-infectives (From admission, onward)    Start     Dose/Rate Route Frequency Ordered Stop   03/06/24 2000  ceFAZolin  (ANCEF ) IVPB 2g/100 mL premix        2 g 200 mL/hr over 30 Minutes Intravenous Every 6 hours 03/06/24 1616 03/07/24 0213   03/06/24 1145  ceFAZolin  (ANCEF ) IVPB 2g/100 mL premix        2 g 200 mL/hr over 30 Minutes Intravenous On call to O.R. 03/06/24 1131 03/06/24 1332     .  She was given sequential compression devices, early ambulation, and lovenox TEDs for DVT prophylaxis.  She benefited maximally from the hospital stay and there were no complications.    Recent vital signs:  Vitals:   03/07/24 0452 03/07/24 0733  BP: 139/83 122/72  Pulse: 87 69  Resp: 18 15  Temp: (!) 97.2 F (36.2 C) 98.1 F (36.7 C)  SpO2: 94% 96%    Recent laboratory studies:  Lab Results  Component Value Date   HGB 13.3 03/07/2024   HGB 13.8 01/25/2024   HGB 13.9 11/17/2023   Lab Results  Component Value Date   WBC 16.8 (H) 03/07/2024   PLT 182 03/07/2024   Lab Results  Component Value Date   INR 1.0 01/25/2024   Lab Results  Component Value Date   NA 141 03/07/2024   K 3.8 03/07/2024   CL 104 03/07/2024   CO2 27 03/07/2024   BUN 11  03/07/2024   CREATININE 0.70 03/07/2024   GLUCOSE 159 (H) 03/07/2024    Discharge Medications:   Allergies as of 03/07/2024       Reactions   Buspar [buspirone] Other (See Comments)   Optical migraines   Divalproex  Sodium Er    Weight gain   Lisinopril Cough   Morphine Itching, Other (See Comments)   tried to take her face off due to itching   Norvasc [amlodipine] Swelling   Sulfa Antibiotics Other (See Comments)   Passes out        Medication List     STOP taking these medications    acetaminophen  650 MG CR tablet Commonly known as: TYLENOL  Replaced by: acetaminophen  500 MG tablet       TAKE these medications    acetaminophen  500 MG tablet Commonly known as: TYLENOL  Take 2 tablets (1,000  mg total) by mouth every 8 (eight) hours. Replaces: acetaminophen  650 MG CR tablet   albuterol  108 (90 Base) MCG/ACT inhaler Commonly known as: VENTOLIN  HFA INHALE 2 PUFFS INTO THE LUNGS EVERY 4 HOURS AS NEEDED FOR WHEEZING OR SHORTNESS OF BREATH   aspirin  EC 81 MG tablet Take 81 mg by mouth daily. Swallow whole.   atorvastatin  40 MG tablet Commonly known as: LIPITOR Take 1 tablet (40 mg total) by mouth daily.   B-complex with vitamin C tablet Take 1 tablet by mouth in the morning.   celecoxib 200 MG capsule Commonly known as: CeleBREX Take 1 capsule (200 mg total) by mouth 2 (two) times daily for 14 days.   cetirizine 10 MG tablet Commonly known as: ZYRTEC Take 10 mg by mouth every morning.   CITRACAL PETITES/VITAMIN D PO Take 1 tablet by mouth in the morning and at bedtime.   [employer] GLUCOSAMINE-CHONDROIT-MSM PO Take 2 tablets by mouth daily.   docusate sodium  100 MG capsule Commonly known as: COLACE Take 1 capsule (100 mg total) by mouth 2 (two) times daily.   DULoxetine  20 MG capsule Commonly known as: CYMBALTA  Take 1 capsule (20 mg total) by mouth 2 (two) times daily.   enoxaparin 40 MG/0.4ML injection Commonly known as: LOVENOX Inject 0.4 mLs (40 mg  total) into the skin daily for 14 days.   ezetimibe  10 MG tablet Commonly known as: ZETIA  Take 1 tablet (10 mg total) by mouth daily.   gabapentin  300 MG capsule Commonly known as: Neurontin  Take 1 capsule (300 mg total) by mouth 2 (two) times daily.   hydrochlorothiazide  25 MG tablet Commonly known as: HYDRODIURIL  Take 1 tablet (25 mg total) by mouth daily.   hydrOXYzine  25 MG tablet Commonly known as: ATARAX  Take 1 tablet (25 mg total) by mouth 3 (three) times daily as needed.   irbesartan  300 MG tablet Commonly known as: AVAPRO  Take 1 tablet (300 mg total) by mouth daily. What changed: when to take this   metoprolol  succinate 50 MG 24 hr tablet Commonly known as: TOPROL -XL Take 1 tablet (50 mg total) by mouth daily. Take with or immediately following a meal. What changed: when to take this   metroNIDAZOLE  0.75 % cream Commonly known as: METROCREAM  Apply to nose and cheeks 1-2 times daily as needed.   Mounjaro  2.5 MG/0.5ML Pen Generic drug: tirzepatide  Inject 2.5 mg into the skin once a week. for diabetes.   Omega-3 1400 MG Caps Take 1,400 mg by mouth daily.   ondansetron  4 MG tablet Commonly known as: ZOFRAN  Take 1 tablet (4 mg total) by mouth every 6 (six) hours as needed for nausea.   oxyCODONE  5 MG immediate release tablet Commonly known as: Roxicodone  Take 0.5-1 tablets (2.5-5 mg total) by mouth every 8 (eight) hours as needed for breakthrough pain.   potassium chloride  10 MEQ tablet Commonly known as: KLOR-CON  Take 1 tablet (10 mEq total) by mouth 2 (two) times daily.   traMADol 50 MG tablet Commonly known as: ULTRAM Take 1 tablet (50 mg total) by mouth every 6 (six) hours as needed for moderate pain (pain score 4-6).   traZODone  50 MG tablet Commonly known as: DESYREL  Take 1-2 tablets (50-100 mg total) by mouth at bedtime as needed for sleep.               Durable Medical Equipment  (From admission, onward)           Start     Ordered  03/06/24 1536  For home use only DME Walker rolling  Once       Question Answer Comment  Walker: With 5 Inch Wheels   Patient needs a walker to treat with the following condition Hx of total knee arthroplasty, right      03/06/24 1535   03/06/24 1536  For home use only DME 3 n 1  Once        03/06/24 1535            Diagnostic Studies: No results found.  Disposition:      Follow-up Information     Charlene Debby BROCKS, PA-C Follow up in 2 week(s).   Specialties: Orthopedic Surgery, Emergency Medicine Contact information: [address] [place] [address] [phone]                  Signed: Debby BROCKS Charlene 03/07/2024, 8:10 AM

## 2024-03-07 NOTE — Anesthesia Postprocedure Evaluation (Signed)
 Anesthesia Post Note  Patient: Sandra Montes  Procedure(s) Performed: ARTHROPLASTY, KNEE, TOTAL (Right: Knee)  Patient location during evaluation: Short Stay Anesthesia Type: Spinal Level of consciousness: awake and oriented Pain management: pain level controlled Vital Signs Assessment: post-procedure vital signs reviewed and stable Respiratory status: spontaneous breathing and respiratory function stable Cardiovascular status: blood pressure returned to baseline Postop Assessment: no headache, no backache, no apparent nausea or vomiting, adequate PO intake and able to ambulate Anesthetic complications: no   There were no known notable events for this encounter.   Last Vitals:  Vitals:   03/07/24 0452 03/07/24 0733  BP: 139/83 122/72  Pulse: 87 69  Resp: 18 15  Temp: (!) 36.2 C 36.7 C  SpO2: 94% 96%    Last Pain:  Vitals:   03/07/24 0733  TempSrc: Oral  PainSc: 5                  Eduard Penkala D Tierra Divelbiss

## 2024-03-07 NOTE — Plan of Care (Signed)
   Problem: Education: Goal: Knowledge of General Education information will improve Description Including pain rating scale, medication(s)/side effects and non-pharmacologic comfort measures Outcome: Progressing

## 2024-03-07 NOTE — Progress Notes (Signed)
 Subjective: 1 Day Post-Op Procedure(s) (LRB): ARTHROPLASTY, KNEE, TOTAL (Right) Patient reports pain as mild.   Patient is well, and has had no acute complaints or problems Denies any CP, SOB, ABD pain. We will continue therapy today.  Plan is to go Home after hospital stay.  Objective: Vital signs in last 24 hours: Temp:  [97 F (36.1 C)-98.1 F (36.7 C)] 98.1 F (36.7 C) (11/04 0733) Pulse Rate:  [63-87] 69 (11/04 0733) Resp:  [10-19] 15 (11/04 0733) BP: (106-144)/(60-93) 122/72 (11/04 0733) SpO2:  [93 %-99 %] 96 % (11/04 0733) Weight:  [113.4 kg] 113.4 kg (11/03 1600)  Intake/Output from previous day: 11/03 0701 - 11/04 0700 In: 2278 [I.V.:1880; IV Piggyback:398] Out: 25 [Blood:25] Intake/Output this shift: No intake/output data recorded.  Recent Labs    03/07/24 0702  HGB 13.3   Recent Labs    03/07/24 0702  WBC 16.8*  RBC 4.43  HCT 37.1  PLT 182   Recent Labs    03/07/24 0702  NA 141  K 3.8  CL 104  CO2 27  BUN 11  CREATININE 0.70  GLUCOSE 159*  CALCIUM  8.4*   No results for input(s): LABPT, INR in the last 72 hours.  EXAM General - Patient is Alert, Appropriate, and Oriented Extremity - Neurovascular intact Sensation intact distally Intact pulses distally Dorsiflexion/Plantar flexion intact Dressing - dressing C/D/I and no drainage Motor Function - intact, moving foot and toes well on exam.   Past Medical History:  Diagnosis Date   Adnexal mass 05/17/2022   a.) CT A/P 05/18/2023: 17.0 x 16.3 x 12.8 cm --> favored to be a large benign fluid filled ovarian cyst --> surgical resection recommended/pending   Anemia    Anxiety    Aortic atherosclerosis    Bartholin's gland abscess    CAD (coronary artery disease) 06/04/2023   a.) cCTA 06/04/2023: Ca2+ = 450 (99th %'ile) --> 27.6 LAD, 66.1 LCx, 356 RCA   Chronic left shoulder pain    Depression    Diastolic dysfunction 05/26/2023   a.) TTE 05/26/2023: EF 55-60%, no RWMAs, G1DD, triv  AR/MR, mild AoC sclerosis without stenosis   DM (diabetes mellitus), type 2 (HCC)    DOE (dyspnea on exertion)    Emphysema lung (HCC)    History of 2019 novel coronavirus disease (COVID-19) 12/13/2020   History of alcohol abuse    History of allergy     History of suicidal ideation 09/14/2022   a.) scared to do it; increasing intrusive thoughts about jumping off a cliff because she is tired of fighting; (+) crying, anxiety, depression, inturrupted sleep, appetite changes related to relationship problems (broke up with BF) and what sounds like caregiver role strain (caregiver to very demanding mother); (+) polysubstance use/abuse   HLD (hyperlipidemia)    Hyperlipidemia    Hypertension    Insomnia    a.) uses trazodone  PRN   Irritable bowel syndrome with diarrhea    Marijuana use    Nephrolithiasis    Osteoarthritis of right knee    Pain in both hands    Polyarthralgia    Positive ANA (antinuclear antibody)    PTSD (post-traumatic stress disorder)    RBBB (right bundle branch block)    Sinus bradycardia    Urinary incontinence    Weight gain due to medication    a.) significant (40lb) weight gain with the initiation of divalproex     Assessment/Plan:   1 Day Post-Op Procedure(s) (LRB): ARTHROPLASTY, KNEE, TOTAL (Right) Principal Problem:  S/P total knee arthroplasty, right  Estimated body mass index is 39.17 kg/m as calculated from the following:   Height as of this encounter: 5' 7 (1.702 m).   Weight as of this encounter: 113.4 kg. Advance diet Up with therapy Pain well controlled Labs and VSS CM to assist with discharge to home with HHPT today  DVT Prophylaxis - Lovenox, TED hose, and SCDS Weight-Bearing as tolerated to right leg   T. Medford Amber, PA-C [facility] 03/07/2024, 7:58 AM

## 2024-03-07 NOTE — Progress Notes (Signed)
 DISCHARGE NOTE:   Pt dc with IV removed and dc instructions given. Pt voices no questions or concerns at this time. Pt received 3 in 1, RW, and medications delivered to hospital room. Pt ahs TED hose on and in place and polar care sent with pt. Pt voices no questions or concerns at this time. Pt wheeled down to medical mall entrance by staff and pt's husband provided transportation.

## 2024-03-22 ENCOUNTER — Encounter: Payer: Self-pay | Admitting: Family

## 2024-03-22 ENCOUNTER — Ambulatory Visit: Admitting: Family

## 2024-03-22 VITALS — BP 110/74 | HR 63 | Temp 98.1°F | Ht 67.0 in | Wt 244.0 lb

## 2024-03-22 DIAGNOSIS — R0609 Other forms of dyspnea: Secondary | ICD-10-CM | POA: Diagnosis not present

## 2024-03-22 DIAGNOSIS — H9192 Unspecified hearing loss, left ear: Secondary | ICD-10-CM

## 2024-03-22 DIAGNOSIS — E119 Type 2 diabetes mellitus without complications: Secondary | ICD-10-CM

## 2024-03-22 DIAGNOSIS — E785 Hyperlipidemia, unspecified: Secondary | ICD-10-CM | POA: Diagnosis not present

## 2024-03-22 DIAGNOSIS — Z8249 Family history of ischemic heart disease and other diseases of the circulatory system: Secondary | ICD-10-CM

## 2024-03-22 DIAGNOSIS — R9431 Abnormal electrocardiogram [ECG] [EKG]: Secondary | ICD-10-CM

## 2024-03-22 DIAGNOSIS — L719 Rosacea, unspecified: Secondary | ICD-10-CM

## 2024-03-22 DIAGNOSIS — R1033 Periumbilical pain: Secondary | ICD-10-CM

## 2024-03-22 DIAGNOSIS — Z7985 Long-term (current) use of injectable non-insulin antidiabetic drugs: Secondary | ICD-10-CM

## 2024-03-22 DIAGNOSIS — I10 Essential (primary) hypertension: Secondary | ICD-10-CM | POA: Diagnosis not present

## 2024-03-22 LAB — CBC WITH DIFFERENTIAL/PLATELET
Basophils Absolute: 0.1 K/uL (ref 0.0–0.1)
Basophils Relative: 1 % (ref 0.0–3.0)
Eosinophils Absolute: 0.6 K/uL (ref 0.0–0.7)
Eosinophils Relative: 7.9 % — ABNORMAL HIGH (ref 0.0–5.0)
HCT: 40.9 % (ref 36.0–46.0)
Hemoglobin: 14.2 g/dL (ref 12.0–15.0)
Lymphocytes Relative: 37.3 % (ref 12.0–46.0)
Lymphs Abs: 2.8 K/uL (ref 0.7–4.0)
MCHC: 34.7 g/dL (ref 30.0–36.0)
MCV: 87.8 fl (ref 78.0–100.0)
Monocytes Absolute: 0.5 K/uL (ref 0.1–1.0)
Monocytes Relative: 6.8 % (ref 3.0–12.0)
Neutro Abs: 3.5 K/uL (ref 1.4–7.7)
Neutrophils Relative %: 47 % (ref 43.0–77.0)
Platelets: 249 K/uL (ref 150.0–400.0)
RBC: 4.65 Mil/uL (ref 3.87–5.11)
RDW: 14.3 % (ref 11.5–15.5)
WBC: 7.4 K/uL (ref 4.0–10.5)

## 2024-03-22 LAB — HEMOGLOBIN A1C: Hgb A1c MFr Bld: 5.5 % (ref 4.6–6.5)

## 2024-03-22 LAB — COMPREHENSIVE METABOLIC PANEL WITH GFR
ALT: 33 U/L (ref 0–35)
AST: 29 U/L (ref 0–37)
Albumin: 4.4 g/dL (ref 3.5–5.2)
Alkaline Phosphatase: 63 U/L (ref 39–117)
BUN: 9 mg/dL (ref 6–23)
CO2: 34 meq/L — ABNORMAL HIGH (ref 19–32)
Calcium: 9.3 mg/dL (ref 8.4–10.5)
Chloride: 98 meq/L (ref 96–112)
Creatinine, Ser: 0.8 mg/dL (ref 0.40–1.20)
GFR: 83.82 mL/min (ref >60.00)
Glucose, Bld: 121 mg/dL — ABNORMAL HIGH (ref 70–99)
Potassium: 3.6 meq/L (ref 3.5–5.1)
Sodium: 139 meq/L (ref 135–145)
Total Bilirubin: 0.9 mg/dL (ref 0.2–1.2)
Total Protein: 7 g/dL (ref 6.0–8.3)

## 2024-03-22 LAB — LIPID PANEL
Cholesterol: 115 mg/dL (ref 0–200)
HDL: 43.4 mg/dL (ref >39.00)
LDL Cholesterol: 45 mg/dL (ref 0–99)
NonHDL: 71.38
Total CHOL/HDL Ratio: 3
Triglycerides: 133 mg/dL (ref 0.0–149.0)
VLDL: 26.6 mg/dL (ref 0.0–40.0)

## 2024-03-22 MED ORDER — POTASSIUM CHLORIDE ER 10 MEQ PO TBCR: 10.0000 meq | EXTENDED_RELEASE_TABLET | Freq: Two times a day (BID) | ORAL | 1 refills | Status: AC

## 2024-03-22 MED ORDER — HYDROCHLOROTHIAZIDE 25 MG PO TABS: 25.0000 mg | ORAL_TABLET | Freq: Every day | ORAL | 3 refills | Status: AC

## 2024-03-22 MED ORDER — METRONIDAZOLE 0.75 % EX CREA: TOPICAL_CREAM | CUTANEOUS | 5 refills | Status: AC

## 2024-03-22 MED ORDER — TIRZEPATIDE 5 MG/0.5ML ~~LOC~~ SOAJ: 5.0000 mg | SUBCUTANEOUS | 2 refills | Status: DC

## 2024-03-22 NOTE — Progress Notes (Signed)
 "  Established Patient Office Visit  Subjective:      CC:  Chief Complaint  Patient presents with   Medical Management of Chronic Issues    HPI: Sandra Montes is a 53 y.o. female presenting on 03/22/2024 for Medical Management of Chronic Issues .  Discussed the use of AI scribe software for clinical note transcription with the patient, who gave verbal consent to proceed.  History of Present Illness Sandra Montes is a 53 year old female with diabetes and coronary artery disease who presents for medication management and evaluation of a possible hernia.  She is seeking a refill of Mounjaro , which she takes at a dose of 2.5 mg. She tolerates it well and has lost ten pounds. She has a history of weight gain with Depakote , which she believes increased her A1c. She is currently off blood thinners and has noticed bruising on her abdomen.  She experiences a burning sensation above her belly button. A previous imaging study noted a very small hiatal hernia. The burning occurs when opening a sliding glass door, is painful to touch, but resolves on its own. No heartburn or acid reflux. She has not attempted to push the area back in. She has increased gassiness, particularly post-surgery, and constipation when taking narcotics.  She has a history of a calcified coronary lesion and a calcium  score of 450. She takes two aspirins daily, as directed by her knee surgeon, and plans to reduce to one aspirin  after a month. She is on atorvastatin  40 mg and Zetia  for cholesterol management. Her echocardiogram from January 2025 showed an ejection fraction of 55-60% and trivial mitral valve regurgitation.  She manages her potassium levels due to hydrochlorothiazide  use, taking two potassium supplements daily, with levels stable between 3.3 and 3.8. She is on duloxetine , trazodone , hydroxyzine , oxycodone , and tramadol  as needed for pain management. She had an isolated episode of vomiting undigested  food ten hours after eating.  She needs a referral to an audiologist due to hearing difficulties in her left ear. She is managing post-surgical recovery from knee surgery and attends physical therapy. She reports occasional breakthrough pain and uses oxycodone  for relief.     Wt Readings from Last 3 Encounters:  03/22/24 244 lb (110.7 kg)  03/06/24 250 lb 1.6 oz (113.4 kg)  02/22/24 250 lb 1.6 oz (113.4 kg)         Social history:  Relevant past medical, surgical, family and social history reviewed and updated as indicated. Interim medical history since our last visit reviewed.  Allergies and medications reviewed and updated.  DATA REVIEWED: CHART IN EPIC     ROS: Negative unless specifically indicated above in HPI.    Current Outpatient Medications:    acetaminophen  (TYLENOL ) 500 MG tablet, Take 2 tablets (1,000 mg total) by mouth every 8 (eight) hours., Disp: 30 tablet, Rfl: 0   albuterol  (VENTOLIN  HFA) 108 (90 Base) MCG/ACT inhaler, INHALE 2 PUFFS INTO THE LUNGS EVERY 4 HOURS AS NEEDED FOR WHEEZING OR SHORTNESS OF BREATH, Disp: 8.5 g, Rfl: 0   aspirin  EC 81 MG tablet, Take 81 mg by mouth daily. Swallow whole., Disp: , Rfl:    atorvastatin  (LIPITOR) 40 MG tablet, Take 1 tablet (40 mg total) by mouth daily., Disp: 90 tablet, Rfl: 3   B Complex-C (B-COMPLEX WITH VITAMIN C) tablet, Take 1 tablet by mouth in the morning., Disp: , Rfl:    Calcium  Citrate-Vitamin D (CITRACAL PETITES/VITAMIN D PO), Take 1 tablet by mouth in  the morning and at bedtime., Disp: , Rfl:    cetirizine (ZYRTEC) 10 MG tablet, Take 10 mg by mouth every morning., Disp: , Rfl:    docusate sodium  (COLACE) 100 MG capsule, Take 1 capsule (100 mg total) by mouth 2 (two) times daily., Disp: 10 capsule, Rfl: 0   DULoxetine  (CYMBALTA ) 20 MG capsule, Take 1 capsule (20 mg total) by mouth 2 (two) times daily., Disp: 180 capsule, Rfl: 1   ezetimibe  (ZETIA ) 10 MG tablet, Take 1 tablet (10 mg total) by mouth daily.,  Disp: 90 tablet, Rfl: 3   gabapentin  (NEURONTIN ) 300 MG capsule, Take 1 capsule (300 mg total) by mouth 2 (two) times daily., Disp: 180 capsule, Rfl: 0   Glucosamine-Chondroitin-MSM ([employer] GLUCOSAMINE-CHONDROIT-MSM PO), Take 2 tablets by mouth daily., Disp: , Rfl:    hydrOXYzine  (ATARAX ) 25 MG tablet, Take 1 tablet (25 mg total) by mouth 3 (three) times daily as needed., Disp: 90 tablet, Rfl: 0   irbesartan  (AVAPRO ) 300 MG tablet, Take 1 tablet (300 mg total) by mouth daily. (Patient taking differently: Take 300 mg by mouth every morning.), Disp: 90 tablet, Rfl: 3   metoprolol  succinate (TOPROL -XL) 50 MG 24 hr tablet, Take 1 tablet (50 mg total) by mouth daily. Take with or immediately following a meal. (Patient taking differently: Take 50 mg by mouth every evening. Take with or immediately following a meal.), Disp: 90 tablet, Rfl: 3   Omega-3 1400 MG CAPS, Take 1,400 mg by mouth daily., Disp: , Rfl:    ondansetron  (ZOFRAN ) 4 MG tablet, Take 1 tablet (4 mg total) by mouth every 6 (six) hours as needed for nausea., Disp: 20 tablet, Rfl: 0   oxyCODONE  (ROXICODONE ) 5 MG immediate release tablet, Take 0.5-1 tablets (2.5-5 mg total) by mouth every 8 (eight) hours as needed for breakthrough pain., Disp: 20 tablet, Rfl: 0   tirzepatide  (MOUNJARO ) 5 MG/0.5ML Pen, Inject 5 mg into the skin once a week., Disp: 6 mL, Rfl: 2   traMADol  (ULTRAM ) 50 MG tablet, Take 1 tablet (50 mg total) by mouth every 6 (six) hours as needed for moderate pain (pain score 4-6)., Disp: 30 tablet, Rfl: 0   traZODone  (DESYREL ) 50 MG tablet, Take 1-2 tablets (50-100 mg total) by mouth at bedtime as needed for sleep., Disp: 180 tablet, Rfl: 1   hydrochlorothiazide  (HYDRODIURIL ) 25 MG tablet, Take 1 tablet (25 mg total) by mouth daily., Disp: 90 tablet, Rfl: 3   metroNIDAZOLE  (METROCREAM ) 0.75 % cream, Apply to nose and cheeks 1-2 times daily as needed., Disp: 45 g, Rfl: 5   potassium chloride  (KLOR-CON ) 10 MEQ tablet, Take 1 tablet (10 mEq  total) by mouth 2 (two) times daily., Disp: 180 tablet, Rfl: 1        Objective:        BP 110/74 (BP Location: Left Arm, Patient Position: Sitting, Cuff Size: Large)   Pulse 63   Temp 98.1 F (36.7 C) (Temporal)   Ht 5' 7 (1.702 m)   Wt 244 lb (110.7 kg)   LMP 10/04/2022 (Approximate) Comment: has period off/on  SpO2 98%   BMI 38.22 kg/m   Physical Exam   Wt Readings from Last 3 Encounters:  03/22/24 244 lb (110.7 kg)  03/06/24 250 lb 1.6 oz (113.4 kg)  02/22/24 250 lb 1.6 oz (113.4 kg)    Physical Exam Vitals reviewed.  Constitutional:      General: She is not in acute distress.    Appearance: Normal appearance. She is obese.  She is not ill-appearing, toxic-appearing or diaphoretic.  HENT:     Head: Normocephalic.  Cardiovascular:     Rate and Rhythm: Normal rate and regular rhythm.  Pulmonary:     Effort: Pulmonary effort is normal.     Breath sounds: Normal breath sounds.  Abdominal:     Hernia: A hernia is present. Hernia is present in the umbilical area (reducible mild tenderness).  Musculoskeletal:        General: Normal range of motion.  Neurological:     General: No focal deficit present.     Mental Status: She is alert and oriented to person, place, and time. Mental status is at baseline.  Psychiatric:        Mood and Affect: Mood normal.        Behavior: Behavior normal.        Thought Content: Thought content normal.        Judgment: Judgment normal.          Results RADIOLOGY Myocardial perfusion imaging: Tiny hiatal hernia. No acute upper abdominal abnormality. Stable left lower lobe, not pulmonary. (02/02/2024)  DIAGNOSTIC Cardiac stress test: Normal Calcium  score: 450, 99th percentile Echocardiogram: Normal left ventricular systolic function with ejection fraction 55-60%, grade 1 diastolic dysfunction, trivial mitral valve regurgitation, mild calcification. (05/2023)  Assessment & Plan:   Assessment and Plan Assessment &  Plan Type 2 diabetes mellitus and obesity management Currently on Mounjaro  2.5 mg with a weight loss of 10 pounds. Tolerating the medication well. Potential for increased weight loss and improved glycemic control with dose escalation. Depakote  may have contributed to weight gain and elevated A1c. - Increased Mounjaro  to 5 mg every four weeks as tolerated - Monitor weight and blood glucose levels - Ensure Mounjaro  refill is processed with potential new prior authorization for increased dose  Essential hypertension Managed with irbesartan  and metoprolol . Blood pressure control is crucial given coronary artery disease and calcified coronary lesions. - Continue irbesartan  and metoprolol   Hyperlipidemia Managed with atorvastatin  and Zetia . Cardiologist oversees atorvastatin  prescription. Liver function tests are necessary every six months. - Continue atorvastatin  and Zetia  - Monitor liver function tests every six months  Rosacea Requires topical treatment. - Prescribed topical cream for rosacea  Periumbilical pain, possible hernia Reports burning sensation above the umbilicus, possibly related to a small hiatal hernia. Pain is tender to touch but not constant. Constipation may have aggravated the hernia. - Monitor for red flag symptoms such as constant pain, bulge, or inability to reduce hernia - Avoid heavy straining - Use Miralax if constipation occurs - Will consider referral to general surgery if symptoms worsen  Constipation Improved since discontinuation of blood thinners. Previously exacerbated by narcotics use. - Use Miralax as needed for constipation - Use stool softeners with opioid use  Left knee pain, post-surgical Post-surgical pain managed with physical therapy and as-needed opioids. Pain is intermittent and managed with activity modification. - Continue physical therapy - Use opioids as needed for breakthrough pain  Hearing loss, left ear Reports difficulty hearing in  the left ear. - Referred to audiologist for hearing evaluation       Return in about 6 months (around 09/19/2024) for f/u CPE.     [name], MSN, APRN, FNP-C [facility]     "

## 2024-03-23 ENCOUNTER — Ambulatory Visit: Payer: Self-pay | Admitting: Family

## 2024-03-27 ENCOUNTER — Telehealth: Payer: Self-pay

## 2024-03-27 NOTE — Telephone Encounter (Signed)
 Please let patient know none of her medications are currently due for refills, they should have refills available.  These were all sent to [facility] pharmacy previously.  She may have to contact [facility] pharmacy to transfer it to [facility].  If she is unable to do that please contact [facility] pharmacy to cancel any pending refills and I can send new prescription to [facility] as needed.

## 2024-03-27 NOTE — Telephone Encounter (Signed)
 Noted

## 2024-03-27 NOTE — Telephone Encounter (Signed)
 Patient was agreeable and states that she also had Knee Sx- 3 weeks ago. She also states that she has already done all of this suggested by provider but she appreciates us  always being on top of this, and if any more problems were to happen from here to now she will call back.    JNL

## 2024-03-27 NOTE — Telephone Encounter (Signed)
 Hello,   Pt is requesting for a refill of her Duloxetine , Trazodone , and Gabapentin . To be sent to  [facility] on file.   Pt last seen: 02/11/2024

## 2024-04-12 ENCOUNTER — Telehealth: Payer: Self-pay

## 2024-04-12 DIAGNOSIS — F331 Major depressive disorder, recurrent, moderate: Secondary | ICD-10-CM

## 2024-04-12 MED ORDER — TRAZODONE HCL 50 MG PO TABS: 50.0000 mg | ORAL_TABLET | Freq: Every evening | ORAL | 1 refills | Status: AC | PRN

## 2024-04-12 NOTE — Telephone Encounter (Signed)
 I have sent trazodone  as requested to [facility] pharmacy.

## 2024-04-12 NOTE — Addendum Note (Signed)
 Addended byBETHA COBY HEIGHT on: 04/12/2024 04:38 PM   Modules accepted: Orders

## 2024-04-12 NOTE — Telephone Encounter (Signed)
 Spoke to patient she stated that she does need a refill of the Trazodone  she would like for it to be sent to [facility] called [facility] she last picked the Trazodone  up on 10/30/23 90 day supply spoke to [place] asked to have the prescription and all refills cancelled also called [facility] pharmacy spoke to [place] she stated that the patient last picked  up the trazodone  on 02-28-24 for 60 tablets [place] will cancel any scripts and remaining refills

## 2024-04-12 NOTE — Telephone Encounter (Signed)
 Received fax from patients pharmacy requesting a refill of  traZODone  (DESYREL ) 50 MG tablet    Lat visit 02-11-24 Next appt 05-12-24    Preferred [facility] - [place], [place] - [address] Phone: [phone]  Fax: [phone]

## 2024-04-12 NOTE — Telephone Encounter (Signed)
 A prescription for trazodone  was sent out in September with 90-day supply and 1 refill to her local pharmacy.  Please clarify with patient if she needs a new prescription sent out to [facility].  If she does need that we will have to contact local pharmacy to cancel any pending prescriptions.  Also will need to clarify with them the most recent date that she she filled out her prescription for trazodone .  Please let me know.

## 2024-04-13 ENCOUNTER — Encounter: Payer: Self-pay | Admitting: Family

## 2024-04-13 DIAGNOSIS — E119 Type 2 diabetes mellitus without complications: Secondary | ICD-10-CM

## 2024-04-17 MED ORDER — ONDANSETRON HCL 4 MG PO TABS: 4.0000 mg | ORAL_TABLET | Freq: Four times a day (QID) | ORAL | 0 refills | Status: AC | PRN

## 2024-04-18 ENCOUNTER — Encounter: Payer: Self-pay | Admitting: Audiologist

## 2024-04-18 ENCOUNTER — Ambulatory Visit: Attending: Family | Admitting: Audiologist

## 2024-04-18 DIAGNOSIS — H9313 Tinnitus, bilateral: Secondary | ICD-10-CM | POA: Insufficient documentation

## 2024-04-18 DIAGNOSIS — H903 Sensorineural hearing loss, bilateral: Secondary | ICD-10-CM | POA: Insufficient documentation

## 2024-04-18 NOTE — Procedures (Signed)
°  Outpatient Audiology and [facility] [address] [place], [place]  [address] [phone]  AUDIOLOGICAL  EVALUATION  NAME: Sandra Montes     DOB:   Aug 19, 1970      MRN: [id number]                                                                                     DATE: 04/18/2024     REFERENT: Corwin Antu, FNP STATUS: Outpatient DIAGNOSIS: Sensorineural Hearing Loss, Tinnitus    History: Sandra Montes was seen for an audiological evaluation due to difficulty hearing her partner in the car. She is asking people to repeat more often.  Sandra Montes denies pain or pressure in either ear. She started having tinnitus as a child. She hears it in both ears. Each ear has a different tone. 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 She is attuned to her tinnitus and hearing. She feels the left ear hearing is worse than the right.  Sandra Montes has no significant occupational history of hazardous noise exposure.  Medical history shows no additional risk for hearing loss.    Evaluation:  Otoscopy showed a clear view of the tympanic membranes, bilaterally Tympanometry results were consistent with normal middle ear function, bilaterally   Audiometric testing was completed using Conventional Audiometry techniques with insert earphones and supraural headphones. Test results are consistent with normal hearing 250-3kHz sloping to a mild sensorineural hearing loss 4-8kHz. Left ear 15dB wrose at [facility] only. Speech Recognition Thresholds were obtained at 15dB HL in the right ear and at 15dB HL in the left ear. Word Recognition Testing was completed at  40dB SL and Sandra Montes scored 100% on each ear.  QuickSIN 0.5dB SNR showing good ability to hear in background noise.    Results:  The test results were reviewed with Sandra Montes. Sandra Montes has a mild high pitched sensorineural hearing loss, her hearing is slightly worse in the left ear at one pitch. This is not a significant asymmetry. Due to her  musical training she is likely more tunes into her tinnitus and hearing. She is not a hearing aid candidate due to the high pitched and mild nature of her loss. The loss is likely the cause of her tinnitus.  Audiogram printed and provided to [place].    Recommendations: Annual audiometric testing recommended to monitor hearing loss for progression.   35 minutes spent testing and counseling on results.   If you have any questions please feel free to contact me at [phone].  Lauraine Ka Stalnaker Au.D.  Audiologist   04/18/2024  9:28 AM  Cc: Corwin Antu, FNP

## 2024-05-01 ENCOUNTER — Other Ambulatory Visit: Payer: Self-pay | Admitting: Psychiatry

## 2024-05-01 DIAGNOSIS — F4310 Post-traumatic stress disorder, unspecified: Secondary | ICD-10-CM

## 2024-05-09 NOTE — Progress Notes (Signed)
 "  Office Visit Note  Patient: Sandra Montes             Date of Birth: 05/07/1970           MRN: [id number]             PCP: Corwin Antu, FNP Referring: Corwin Antu, FNP Visit Date: 05/23/2024 Occupation: Data Unavailable  Subjective:  Pain in joints  History of Present Illness: Sandra Montes is a 54 y.o. female with osteoarthritis and positive ANA.  She returns today after her last visit in July 2025.  She underwent right total knee replacement on March 06, 2024 at [facility].  She had good response to the surgery.  She continues to have discomfort in her right hip and left shoulder.  She denies any history of injury.  She is gradually recovering from the knee joint replacement.  He had physical therapy.  She noticed a spot on her right middle finger.  She continues to have fatigue, dry mouth, dry eyes, photosensitivity.  There is no history of Raynaud's, oral ulcers, nasal ulcers, malar rash or lymphadenopathy.  There is no history of inflammatory arthritis.    Activities of Daily Living:  Patient reports morning stiffness for 15-20 minutes.   Patient Denies nocturnal pain.  Difficulty dressing/grooming: Denies Difficulty climbing stairs: Reports Difficulty getting out of chair: Reports Difficulty using hands for taps, buttons, cutlery, and/or writing: Denies  Review of Systems  Constitutional:  Positive for fatigue.  HENT:  Positive for mouth dryness. Negative for mouth sores.   Eyes:  Positive for dryness.  Respiratory:  Negative for shortness of breath.   Cardiovascular:  Negative for chest pain and palpitations.  Gastrointestinal:  Positive for constipation and diarrhea. Negative for blood in stool.  Endocrine: Negative for increased urination.  Genitourinary:  Negative for involuntary urination.  Musculoskeletal:  Positive for joint pain, joint pain, myalgias, morning stiffness and myalgias. Negative for gait problem, joint swelling, muscle  weakness and muscle tenderness.  Skin:  Positive for sensitivity to sunlight. Negative for color change, rash and hair loss.  Allergic/Immunologic: Negative for susceptible to infections.  Neurological:  Positive for headaches. Negative for dizziness.  Hematological:  Negative for swollen glands.  Psychiatric/Behavioral:  Positive for sleep disturbance. Negative for depressed mood. The patient is nervous/anxious.     PMFS History:  Patient Active Problem List   Diagnosis Date Noted   Attention and concentration deficit 05/12/2024   Type 2 diabetes mellitus without complication, without long-term current use of insulin (HCC) 03/22/2024   Hearing loss of left ear 03/22/2024   S/P total knee arthroplasty, right 03/06/2024   MDD (major depressive disorder), recurrent episode, moderate (HCC) 12/31/2023   Prediabetes 08/18/2023   History of tobacco abuse 08/18/2023   Proteinuria 06/08/2023   Tear of lateral collateral ligament of right knee 05/17/2023   Acute meniscal tear of knee, right, sequela 05/17/2023   Electrocardiogram showing T wave abnormalities 05/17/2023   DOE (dyspnea on exertion) 05/17/2023   Abnormal weight gain 05/17/2023   PTSD (post-traumatic stress disorder) 03/24/2023   High risk medication use 03/24/2023   History of alcohol abuse 03/24/2023   History of cannabis abuse 03/24/2023   Cessation of tobacco use in previous 12 months 02/11/2023   History of suicidal ideation 01/28/2023   Sleep disorder 12/30/2022   MDD (major depressive disorder), recurrent episode, severe (HCC) 12/17/2022   Obsessional thoughts 10/20/2022   Family history of cardiac arrest 09/07/2022   Erythrocytosis 01/30/2022  Elevated hemoglobin 01/13/2022   History of colon polyps 01/13/2022   Hot flushes, perimenopausal 01/13/2022   Anxiety state 01/13/2022   Irritable bowel syndrome with diarrhea 10/13/2021   Centrilobular emphysema (HCC) 07/04/2021   Non-seasonal allergic rhinitis due to  pollen 07/04/2021   Dyslipidemia 12/29/2018   Essential hypertension 12/29/2018    Past Medical History:  Diagnosis Date   Adnexal mass 05/17/2022   a.) CT A/P 05/18/2023: 17.0 x 16.3 x 12.8 cm --> favored to be a large benign fluid filled ovarian cyst --> surgical resection recommended/pending   Anemia    Anxiety    Aortic atherosclerosis    Bartholin's gland abscess    CAD (coronary artery disease) 06/04/2023   a.) cCTA 06/04/2023: Ca2+ = 450 (99th %'ile) --> 27.6 LAD, 66.1 LCx, 356 RCA   Chronic left shoulder pain    Depression    Diastolic dysfunction 05/26/2023   a.) TTE 05/26/2023: EF 55-60%, no RWMAs, G1DD, triv AR/MR, mild AoC sclerosis without stenosis   DM (diabetes mellitus), type 2 (HCC)    DOE (dyspnea on exertion)    Emphysema lung (HCC)    History of 2019 novel coronavirus disease (COVID-19) 12/13/2020   History of alcohol abuse    History of allergy     History of suicidal ideation 09/14/2022   a.) scared to do it; increasing intrusive thoughts about jumping off a cliff because she is tired of fighting; (+) crying, anxiety, depression, inturrupted sleep, appetite changes related to relationship problems (broke up with BF) and what sounds like caregiver role strain (caregiver to very demanding mother); (+) polysubstance use/abuse   HLD (hyperlipidemia)    Hyperlipidemia    Hypertension    Insomnia    a.) uses trazodone  PRN   Irritable bowel syndrome with diarrhea    Marijuana use    Nephrolithiasis    Osteoarthritis of right knee    Pain in both hands    Polyarthralgia    Positive ANA (antinuclear antibody)    PTSD (post-traumatic stress disorder)    RBBB (right bundle branch block)    Sinus bradycardia    Urinary incontinence    Weight gain due to medication    a.) significant (40lb) weight gain with the initiation of divalproex     Family History  Problem Relation Age of Onset   Depression Mother    Alcohol abuse Mother    Heart attack Mother 90    Skin cancer Father    Alcohol abuse Father    Prostate cancer Father    Rheum arthritis Paternal Grandmother    Prostate cancer Paternal Grandfather    Skin cancer Paternal Grandfather    Asthma Daughter    Mental illness Daughter        borderline   Mitral valve prolapse Daughter        & enlarged aorta   Migraines Daughter    Alcohol abuse Cousin    Healthy Son    Healthy Son    Past Surgical History:  Procedure Laterality Date   ABDOMINAL HYSTERECTOMY  07/08/2023   CESAREAN SECTION N/A 1993   CHOLECYSTECTOMY N/A 1996   COLONOSCOPY WITH PROPOFOL  N/A 11/18/2021   Procedure: COLONOSCOPY WITH PROPOFOL ;  Surgeon: Unk Corinn Skiff, MD;  Location: [facility];  Service: Gastroenterology;  Laterality: N/A;   KNEE ARTHROSCOPY WITH MEDIAL MENISECTOMY Right 10/07/2023   Procedure: ARTHROSCOPY, KNEE, WITH MEDIAL MENISCECTOMY;  Surgeon: Onesimo Oneil LABOR, MD;  Location: [facility];  Service: Orthopedics;  Laterality: Right;   ROBOTIC ASSISTED  BILATERAL SALPINGO OOPHERECTOMY Bilateral 07/08/2023   Procedure: XI ROBOTIC ASSISTED BILATERAL SALPINGO OOPHORECTOMY, REMOVAL OF ADNEXAL MASS;  Surgeon: Verdon Keen, MD;  Location: [facility];  Service: Gynecology;  Laterality: Bilateral;   TOTAL KNEE ARTHROPLASTY Right 03/06/2024   Procedure: ARTHROPLASTY, KNEE, TOTAL;  Surgeon: Lorelle Hussar, MD;  Location: [facility];  Service: Orthopedics;  Laterality: Right;   TUBAL LIGATION Bilateral 1995   Social History[1] Social History   Social History Narrative   ** Merged History Encounter **       Active smoker- lives in [place]; with mother- care giver; with quality/control- mortgage.      Immunization History  Administered Date(s) Administered   Tdap 10/17/2014     Objective: Vital Signs: BP 111/75   Pulse 68   Temp 97.7 F (36.5 C)   Resp 15   Ht 5' 7 (1.702 m)   Wt 237 lb 9.6 oz (107.8 kg)   LMP 10/04/2022 Comment: has period off/on  BMI 37.21 kg/m    Physical Exam Vitals  and nursing note reviewed.  Constitutional:      Appearance: She is well-developed.  HENT:     Head: Normocephalic and atraumatic.  Eyes:     Conjunctiva/sclera: Conjunctivae normal.  Cardiovascular:     Rate and Rhythm: Normal rate and regular rhythm.     Heart sounds: Normal heart sounds.  Pulmonary:     Effort: Pulmonary effort is normal.     Breath sounds: Normal breath sounds.  Abdominal:     General: Bowel sounds are normal.     Palpations: Abdomen is soft.  Musculoskeletal:     Cervical back: Normal range of motion.  Skin:    General: Skin is warm and dry.     Capillary Refill: Capillary refill takes less than 2 seconds.  Neurological:     Mental Status: She is alert and oriented to person, place, and time.  Psychiatric:        Behavior: Behavior normal.      Musculoskeletal Exam: Cervical, thoracic and lumbar spine were in good range of motion.  There was no SI joint tenderness.  Shoulder joints, elbow joints, wrist joints, MCPs, PIPs and DIPs were in good range of motion with no synovitis. Patient has discomfort with abduction and internal rotation of the right shoulder.  Mucinous cyst was noted over the right third finger DIP joint.  Hip joints and knee joints were in good range of motion.  She some tenderness over right trochanteric bursa.  She had warmth on palpation of her right knee joint which was replaced.  There was no tenderness over ankles or MTPs.   CDAI Exam: CDAI Score: -- Patient Global: --; Provider Global: -- Swollen: --; Tender: -- Joint Exam 05/23/2024   No joint exam has been documented for this visit   There is currently no information documented on the homunculus. Go to the Rheumatology activity and complete the homunculus joint exam.  Investigation: No additional findings.  Imaging: No results found.  Recent Labs: Lab Results  Component Value Date   WBC 7.4 03/22/2024   HGB 14.2 03/22/2024   PLT 249.0 03/22/2024   NA 139 03/22/2024    K 3.6 03/22/2024   CL 98 03/22/2024   CO2 34 (H) 03/22/2024   GLUCOSE 121 (H) 03/22/2024   BUN 9 03/22/2024   CREATININE 0.80 03/22/2024   BILITOT 0.9 03/22/2024   ALKPHOS 63 03/22/2024   AST 29 03/22/2024   ALT 33 03/22/2024   PROT 7.0  03/22/2024   ALBUMIN 4.4 03/22/2024   CALCIUM  9.3 03/22/2024    Speciality Comments: No specialty comments available.  Procedures:  No procedures performed Allergies: Buspar [buspirone], Divalproex  sodium er, Lisinopril, Morphine , Norvasc  [amlodipine ], and Sulfa antibiotics   Assessment / Plan:     Visit Diagnoses: Chronic left shoulder pain -patient complains of ongoing pain and discomfort in the left shoulder.  She states that she has nocturnal pain.  She had painful abduction and internal rotation.  I offered cortisone injection which she declined.  Patient would prefer to try some exercises at home.  A handout on shoulder exercise was given.  X-rays obtained in the past of the left shoulder were consistent with early acromioclavicular degenerative changes.  I advised her to contact us  if she is interested in getting cortisone injection in the future.  Primary osteoarthritis of both hands -bilateral PIP and DIP thickening was noted.  Clinical and radiographic findings were suggestive of osteoarthritis.  Digital mucinous cyst - Right third finger DIP.  Observation was advised.  Trochanteric bursitis, right hip-she also has some tenderness over right trochanteric region.  IT band stretches were demonstrated in the office.  She has some difficulty doing the IT band stretches due to recent knee replacement.  Status post total knee replacement, right - 03/06/2024 at [facility].  She had physical therapy.  She had good response to the knee replacement.  Positive ANA (antinuclear antibody) - ANA low titer positive 1: 320 nucleolar, ENA negative and complements were normal.she has no clinical features of lupus.  Advised her to contact us  if she  develops any new symptoms.  Raynaud's disease without gangrene -she had good capillary refill without any sclerodactyly or nailbed capillary changes.  Weight gain -weight loss diet and exercise was emphasized.  Other proteinuria-patient was evaluated by nephrology and sees them once a year.  Essential hypertension-blood pressure was normal at 111/75 today.  Other medical problems are listed as follows:  History of colon polyps  Dyslipidemia  Irritable bowel syndrome with diarrhea  PTSD (post-traumatic stress disorder)  Non-seasonal allergic rhinitis due to pollen  Anxiety and depression  Sleep disorder  History of alcohol abuse  History of cannabis abuse  History of suicidal ideation  Former smoker - She quit smoking in June 2024.  Orders: No orders of the defined types were placed in this encounter.  No orders of the defined types were placed in this encounter.    Follow-Up Instructions: Return in about 6 months (around 11/20/2024) for Osteoarthritis.   Maya Nash, MD  Note - This record has been created using Animal nutritionist.  Chart creation errors have been sought, but may not always  have been located. Such creation errors do not reflect on  the standard of medical care.     [1]  Social History Tobacco Use   Smoking status: Former    Current packs/day: 0.00    Average packs/day: 1.7 packs/day for 57.0 years (95.5 ttl pk-yrs)    Types: Cigarettes    Start date: 2004    Quit date: 2024    Years since quitting: 2.0    Passive exposure: Current   Smokeless tobacco: Never   Tobacco comments:    Pt smoked two packs cigarettes for the past 20 yrs  Vaping Use   Vaping status: Never Used  Substance Use Topics   Alcohol use: Not Currently    Comment: h/o alcoholism quit 2017   Drug use: Yes    Types: Marijuana  Comment: every day   "

## 2024-05-11 MED ORDER — TIRZEPATIDE 7.5 MG/0.5ML ~~LOC~~ SOAJ: 7.5000 mg | SUBCUTANEOUS | 0 refills | Status: DC

## 2024-05-11 NOTE — Addendum Note (Signed)
 Addended by: CORWIN ANTU on: 05/11/2024 05:54 PM   Modules accepted: Orders

## 2024-05-12 ENCOUNTER — Encounter: Payer: Self-pay | Admitting: Psychiatry

## 2024-05-12 ENCOUNTER — Telehealth: Admitting: Psychiatry

## 2024-05-12 DIAGNOSIS — R41840 Attention and concentration deficit: Secondary | ICD-10-CM | POA: Diagnosis not present

## 2024-05-12 DIAGNOSIS — F4310 Post-traumatic stress disorder, unspecified: Secondary | ICD-10-CM | POA: Diagnosis not present

## 2024-05-12 DIAGNOSIS — F3342 Major depressive disorder, recurrent, in full remission: Secondary | ICD-10-CM

## 2024-05-12 MED ORDER — TIRZEPATIDE 7.5 MG/0.5ML ~~LOC~~ SOAJ: 7.5000 mg | SUBCUTANEOUS | 0 refills | Status: AC

## 2024-05-12 NOTE — Addendum Note (Signed)
 Addended by: CORWIN ANTU on: 05/12/2024 02:50 PM   Modules accepted: Orders

## 2024-05-12 NOTE — Progress Notes (Signed)
 Virtual Visit via Video Note  I connected with Sandra Montes on 05/12/2024 at  9:00 AM EST by a video enabled telemedicine application and verified that I am speaking with the correct person using two identifiers.  Location Provider Location : [facility] Patient Location : Work  Participants: Patient , Provider   I discussed the limitations of evaluation and management by telemedicine and the availability of in person appointments. The patient expressed understanding and agreed to proceed.  I discussed the assessment and treatment plan with the patient. The patient was provided an opportunity to ask questions and all were answered. The patient agreed with the plan and demonstrated an understanding of the instructions.   The patient was advised to call back or seek an in-person evaluation if the symptoms worsen or if the condition fails to improve as anticipated.  BH MD OP Progress Note  05/12/2024 11:01 AM Sandra Montes  MRN:  [id number]  Chief Complaint:  Chief Complaint  Patient presents with   Follow-up   Anxiety   Medication Refill   Discussed the use of AI scribe software for clinical note transcription with the patient, who gave verbal consent to proceed.  History of Present Illness Sandra Montes is a 54 year old Caucasian female, divorced, lives with her partner in [place], currently employed, has a history of MDD, PTSD, history of alcohol and cannabis abuse in remission, emphysema, hypertension, asthma, osteoarthritis, diabetes was evaluated by telemedicine today.  She denies any current thoughts of hurting herself or others. Ongoing mood symptoms, primarily characterized by anger related to current world events, serve as her main emotional trigger. She denies significant hopelessness or depressive symptoms in the past few weeks and does not experience a lack of motivation affecting her work, home, or relationships. She continues to engage with her therapist,  who has discussed the possibility of ADHD based on her symptoms.  Ongoing symptoms include a constantly racing mind, difficulty staying engaged in tasks that do not interest her, frequent fidgeting, impulsivity (notably with spending and, in the past, sexual relationships), and making careless mistakes at work. She describes the ability to hyperfocus on work tasks she finds engaging, such as fraud investigation, but also notes a tendency to go down unnecessary rabbit holes. Her therapist has administered ADHD screening tests twice, and both times she scored high for ADHD. Together, she and her therapist have been working on coping mechanisms, particularly for impulsivity.  She manages her sleep with trazodone  in the evening. She continues to have difficulty sleeping in her bed due to pain from her recent knee surgery, which leads her to sleep in a recliner most nights. She typically gets 4 to 5 hours of sleep in bed before moving to the recliner. She identifies physical discomfort as a contributor to her sleep disruption.  Her current medication regimen includes Cymbalta  (duloxetine ), gabapentin  (taken in the morning and evening), trazodone  at night. She has not had any recent changes to her medications and maintains a sufficient supply of duloxetine  and gabapentin .   She denies any recent trauma-related symptoms such as nightmares, flashbacks, or intrusive memories. She continues to engage in therapy.  She is status post right sided total knee replacement, currently back at work since Monday.      Visit Diagnosis:    ICD-10-CM   1. Recurrent major depressive disorder, in full remission  F33.42     2. PTSD (post-traumatic stress disorder)  F43.10     3. Attention and concentration deficit  R41.840 Ambulatory  referral to Neuropsychology      Past Psychiatric History: I have reviewed past psychiatric history from progress note on 03/24/2023.  Past trials of medications like Pristiq,  Wellbutrin, Depakote -weight gain, Paxil, Prozac.  Had suicidal ideation with a plan in 2024, status post inpatient admission.  Past Medical History:  Past Medical History:  Diagnosis Date   Adnexal mass 05/17/2022   a.) CT A/P 05/18/2023: 17.0 x 16.3 x 12.8 cm --> favored to be a large benign fluid filled ovarian cyst --> surgical resection recommended/pending   Anemia    Anxiety    Aortic atherosclerosis    Bartholin's gland abscess    CAD (coronary artery disease) 06/04/2023   a.) cCTA 06/04/2023: Ca2+ = 450 (99th %'ile) --> 27.6 LAD, 66.1 LCx, 356 RCA   Chronic left shoulder pain    Depression    Diastolic dysfunction 05/26/2023   a.) TTE 05/26/2023: EF 55-60%, no RWMAs, G1DD, triv AR/MR, mild AoC sclerosis without stenosis   DM (diabetes mellitus), type 2 (HCC)    DOE (dyspnea on exertion)    Emphysema lung (HCC)    History of 2019 novel coronavirus disease (COVID-19) 12/13/2020   History of alcohol abuse    History of allergy     History of suicidal ideation 09/14/2022   a.) scared to do it; increasing intrusive thoughts about jumping off a cliff because she is tired of fighting; (+) crying, anxiety, depression, inturrupted sleep, appetite changes related to relationship problems (broke up with BF) and what sounds like caregiver role strain (caregiver to very demanding mother); (+) polysubstance use/abuse   HLD (hyperlipidemia)    Hyperlipidemia    Hypertension    Insomnia    a.) uses trazodone  PRN   Irritable bowel syndrome with diarrhea    Marijuana use    Nephrolithiasis    Osteoarthritis of right knee    Pain in both hands    Polyarthralgia    Positive ANA (antinuclear antibody)    PTSD (post-traumatic stress disorder)    RBBB (right bundle branch block)    Sinus bradycardia    Urinary incontinence    Weight gain due to medication    a.) significant (40lb) weight gain with the initiation of divalproex     Past Surgical History:  Procedure Laterality Date    ABDOMINAL HYSTERECTOMY  07/08/2023   CESAREAN SECTION N/A 1993   CHOLECYSTECTOMY N/A 1996   COLONOSCOPY WITH PROPOFOL  N/A 11/18/2021   Procedure: COLONOSCOPY WITH PROPOFOL ;  Surgeon: Unk Corinn Skiff, MD;  Location: [facility];  Service: Gastroenterology;  Laterality: N/A;   KNEE ARTHROSCOPY WITH MEDIAL MENISECTOMY Right 10/07/2023   Procedure: ARTHROSCOPY, KNEE, WITH MEDIAL MENISCECTOMY;  Surgeon: Onesimo Oneil LABOR, MD;  Location: [facility];  Service: Orthopedics;  Laterality: Right;   ROBOTIC ASSISTED BILATERAL SALPINGO OOPHERECTOMY Bilateral 07/08/2023   Procedure: XI ROBOTIC ASSISTED BILATERAL SALPINGO OOPHORECTOMY, REMOVAL OF ADNEXAL MASS;  Surgeon: Verdon Keen, MD;  Location: [facility];  Service: Gynecology;  Laterality: Bilateral;   TOTAL KNEE ARTHROPLASTY Right 03/06/2024   Procedure: ARTHROPLASTY, KNEE, TOTAL;  Surgeon: Lorelle Hussar, MD;  Location: [facility];  Service: Orthopedics;  Laterality: Right;   TUBAL LIGATION Bilateral 1995    Family Psychiatric History: I have reviewed family psychiatric history from progress note on 03/24/2023.  Family History:  Family History  Problem Relation Age of Onset   Depression Mother    Alcohol abuse Mother    Heart attack Mother 49   Skin cancer Father    Alcohol abuse Father  Prostate cancer Father    Rheum arthritis Paternal Grandmother    Prostate cancer Paternal Grandfather    Skin cancer Paternal Grandfather    Asthma Daughter    Mental illness Daughter        borderline   Mitral valve prolapse Daughter        & enlarged aorta   Migraines Daughter    Alcohol abuse Cousin    Healthy Son    Healthy Son     Social History: I have reviewed social history from progress note on 03/24/2023. Social History   Socioeconomic History   Marital status: Divorced    Spouse name: Not on file   Number of children: 3   Years of education: 12   Highest education level: Associate degree: occupational, scientist, product/process development, or vocational  program  Occupational History    Employer: [employer]    Comment: mortgage insurance  Tobacco Use   Smoking status: Former    Current packs/day: 0.00    Average packs/day: 1.7 packs/day for 57.0 years (95.5 ttl pk-yrs)    Types: Cigarettes    Start date: 2004    Quit date: 2024    Years since quitting: 2.0    Passive exposure: Current   Smokeless tobacco: Never   Tobacco comments:    Pt smoked two packs cigarettes for the past 20 yrs  Vaping Use   Vaping status: Never Used  Substance and Sexual Activity   Alcohol use: Not Currently    Comment: h/o alcoholism quit 2017   Drug use: Yes    Types: Marijuana    Comment: every day   Sexual activity: Not Currently    Partners: Male    Birth control/protection: None  Other Topics Concern   Not on file  Social History Narrative   ** Merged History Encounter **       Active smoker- lives in [place]; with mother- care giver; with quality/control- mortgage.    Social Drivers of Health   Tobacco Use: Medium Risk (05/12/2024)   Patient History    Smoking Tobacco Use: Former    Smokeless Tobacco Use: Never    Passive Exposure: Current  Physicist, Medical Strain: Low Risk  (04/18/2024)   [facility]   Overall Financial Resource Strain (CARDIA)    Difficulty of Paying Living Expenses: Not hard at all  Food Insecurity: No Food Insecurity (04/18/2024)   [facility]   Epic    Within the past 12 months, you worried that your food would run out before you got the money to buy more.: Never true    Within the past 12 months, the food you bought just didn't last and you didn't have money to get more.: Never true  Transportation Needs: No Transportation Needs (04/18/2024)   Received from [facility] - Transportation    In the past 12 months, has lack of transportation kept you from medical appointments or from getting medications?: No     Lack of Transportation (Non-Medical): No  Physical Activity: Unknown (05/14/2023)   Exercise Vital Sign    Days of Exercise per Week: 0 days    Minutes of Exercise per Session: Not on file  Stress: No Stress Concern Present (05/14/2023)   [employer] of Occupational Health - Occupational Stress Questionnaire    Feeling of Stress : Only a little  Social Connections: Socially Isolated (05/14/2023)   Social Connection and Isolation Panel  Frequency of Communication with Friends and Family: More than three times a week    Frequency of Social Gatherings with Friends and Family: Once a week    Attends Religious Services: Never    Database Administrator or Organizations: No    Attends Engineer, Structural: Not on file    Marital Status: Divorced  Depression (PHQ2-9): Low Risk (05/12/2024)   Depression (PHQ2-9)    PHQ-2 Score: 0  Alcohol Screen: Low Risk (05/14/2023)   Alcohol Screen    Last Alcohol Screening Score (AUDIT): 1  Housing: Low Risk  (04/18/2024)   Received from [facility]   Epic    In the last 12 months, was there a time when you were not able to pay the mortgage or rent on time?: No    In the past 12 months, how many times have you moved where you were living?: 0    At any time in the past 12 months, were you homeless or living in a shelter (including now)?: No  Utilities: Not At Risk (04/18/2024)   Received from [facility]   Epic    In the past 12 months has the electric, gas, oil, or water company threatened to shut off services in your home?: No  Health Literacy: Not on file    Allergies: Allergies[1]  Metabolic Disorder Labs: Lab Results  Component Value Date   HGBA1C 5.5 03/22/2024   MPG 111 07/04/2021   Lab Results  Component Value Date   PROLACTIN 4.3 01/13/2022   Lab Results  Component Value Date   CHOL 115 03/22/2024   TRIG 133.0 03/22/2024   HDL 43.40 03/22/2024   CHOLHDL 3 03/22/2024   VLDL 26.6  03/22/2024   LDLCALC 45 03/22/2024   LDLCALC 66 12/03/2023   Lab Results  Component Value Date   TSH 2.52 11/17/2023   TSH 4.19 08/18/2023    Therapeutic Level Labs: No results found for: LITHIUM Lab Results  Component Value Date   VALPROATE 54.0 05/17/2023   VALPROATE 64.6 01/01/2023   No results found for: CBMZ  Current Medications: Current Outpatient Medications  Medication Sig Dispense Refill   acetaminophen  (TYLENOL ) 500 MG tablet Take 2 tablets (1,000 mg total) by mouth every 8 (eight) hours. 30 tablet 0   albuterol  (VENTOLIN  HFA) 108 (90 Base) MCG/ACT inhaler INHALE 2 PUFFS INTO THE LUNGS EVERY 4 HOURS AS NEEDED FOR WHEEZING OR SHORTNESS OF BREATH 8.5 g 0   aspirin  EC 81 MG tablet Take 81 mg by mouth daily. Swallow whole.     atorvastatin  (LIPITOR) 40 MG tablet Take 1 tablet (40 mg total) by mouth daily. 90 tablet 3   B Complex-C (B-COMPLEX WITH VITAMIN C) tablet Take 1 tablet by mouth in the morning.     Calcium  Citrate-Vitamin D (CITRACAL PETITES/VITAMIN D PO) Take 1 tablet by mouth in the morning and at bedtime.     cetirizine (ZYRTEC) 10 MG tablet Take 10 mg by mouth every morning.     docusate sodium  (COLACE) 100 MG capsule Take 1 capsule (100 mg total) by mouth 2 (two) times daily. 10 capsule 0   DULoxetine  (CYMBALTA ) 20 MG capsule Take 1 capsule (20 mg total) by mouth 2 (two) times daily. 180 capsule 1   ezetimibe  (ZETIA ) 10 MG tablet Take 1 tablet (10 mg total) by mouth daily. 90 tablet 3   gabapentin  (NEURONTIN ) 300 MG capsule TAKE 1 CAPSULE BY MOUTH TWICE  DAILY 60 capsule 3  Glucosamine-Chondroitin-MSM ([employer] GLUCOSAMINE-CHONDROIT-MSM PO) Take 2 tablets by mouth daily.     hydrochlorothiazide  (HYDRODIURIL ) 25 MG tablet Take 1 tablet (25 mg total) by mouth daily. 90 tablet 3   hydrOXYzine  (ATARAX ) 25 MG tablet Take 1 tablet (25 mg total) by mouth 3 (three) times daily as needed. 90 tablet 0   irbesartan  (AVAPRO ) 300 MG tablet Take 1 tablet (300 mg total) by  mouth daily. (Patient taking differently: Take 300 mg by mouth every morning.) 90 tablet 3   metoprolol  succinate (TOPROL -XL) 50 MG 24 hr tablet Take 1 tablet (50 mg total) by mouth daily. Take with or immediately following a meal. (Patient taking differently: Take 50 mg by mouth every evening. Take with or immediately following a meal.) 90 tablet 3   metroNIDAZOLE  (METROCREAM ) 0.75 % cream Apply to nose and cheeks 1-2 times daily as needed. 45 g 5   Omega-3 1400 MG CAPS Take 1,400 mg by mouth daily.     ondansetron  (ZOFRAN ) 4 MG tablet Take 1 tablet (4 mg total) by mouth every 6 (six) hours as needed for nausea. 20 tablet 0   oxyCODONE  (ROXICODONE ) 5 MG immediate release tablet Take 0.5-1 tablets (2.5-5 mg total) by mouth every 8 (eight) hours as needed for breakthrough pain. 20 tablet 0   potassium chloride  (KLOR-CON ) 10 MEQ tablet Take 1 tablet (10 mEq total) by mouth 2 (two) times daily. 180 tablet 1   tirzepatide  (MOUNJARO ) 7.5 MG/0.5ML Pen Inject 7.5 mg into the skin once a week. 6 mL 0   traMADol  (ULTRAM ) 50 MG tablet Take 1 tablet (50 mg total) by mouth every 6 (six) hours as needed for moderate pain (pain score 4-6). 30 tablet 0   traZODone  (DESYREL ) 50 MG tablet Take 1-2 tablets (50-100 mg total) by mouth at bedtime as needed for sleep. 180 tablet 1   No current facility-administered medications for this visit.     Musculoskeletal: Strength & Muscle Tone: UTA Gait & Station: Seated Patient leans: N/A  Psychiatric Specialty Exam: Review of Systems  Psychiatric/Behavioral:  Positive for decreased concentration. The patient is nervous/anxious.     Last menstrual period 10/04/2022.There is no height or weight on file to calculate BMI.  General Appearance: Casual  Eye Contact:  Fair  Speech:  Clear and Coherent  Volume:  Normal  Mood:  Anxious  Affect:  Appropriate  Thought Process:  Goal Directed and Descriptions of Associations: Intact  Orientation:  Full (Time, Place, and  Person)  Thought Content: Logical   Suicidal Thoughts:  No  Homicidal Thoughts:  No  Memory:  Immediate;   Fair Recent;   Fair Remote;   Fair  Judgement:  Fair  Insight:  Fair  Psychomotor Activity:  Normal  Concentration:  Concentration: Fair and Attention Span: Fair  Recall:  [employer] of Knowledge: Fair  Language: Fair  Akathisia:  No  Handed:  Right  AIMS (if indicated): not done  Assets:  Communication Skills Desire for Improvement Housing Social Support Transportation  ADL's:  Intact  Cognition: WNL  Sleep:  Improving   Screenings: AUDIT    Flowsheet Row [facility]  Alcohol Use Disorder Identification Test Final Score (AUDIT) 0   GAD-7    Flowsheet Row Office Visit from 03/22/2024 in [facility] Office Visit from 12/02/2023 in [facility] Office Visit from 03/24/2023 in [facility] Office Visit  from 03/18/2023 in [facility] Visit from 02/16/2023 in [facility]  Total GAD-7 Score 0 2 3 5 3    PHQ2-9    Flowsheet Row Video Visit from 05/12/2024 in [facility] Office Visit from 03/22/2024 in [facility] Office Visit from 12/02/2023 in [facility] Office Visit from 08/18/2023 in [facility] Office Visit from 05/17/2023 in [facility]  PHQ-2 Total Score 0 0 1 0 0  PHQ-9 Total Score -- 2 -- -- 2   Flowsheet Row Video Visit from 05/12/2024 in [facility] Admission (Discharged) from 03/06/2024 in [facility] Video Visit from 02/11/2024 in [facility]  C-SSRS RISK CATEGORY Moderate Risk No Risk No Risk     Assessment and Plan: Sandra Montes is a 54 year old Caucasian female who presented for a follow-up appointment, discussed assessment and plan as noted below.  1. Recurrent major depressive disorder, in full remission Currently reports depression symptoms as managed on the current medication regimen. Continue Gabapentin  300 mg twice daily Continue Duloxetine  20 mg twice daily Continue psychotherapy sessions with Mr.AJ Tschopp  2. PTSD (post-traumatic stress disorder)-stable Ongoing anxiety due to current social political situation and agrees to continue to work with therapist on the same. Otherwise denies any significant trauma related symptoms. Continue Duloxetine  as prescribed Continue Hydroxyzine  25 mg 3 times a day as needed Continue psychotherapy sessions  3. Attention and concentration deficit-unstable Currently with concerns about having a diagnosis of ADHD.  Reports her therapist provided multiple screening and she scored high on the same and is interested in further testing. Ambulatory referral to neuropsychologist to rule out ADHD.  Follow-up Follow-up in clinic in 3 months or sooner in person.    Collaboration of Care: Collaboration of Care: Referral or follow-up with counselor/therapist AEB patient encouraged to continue psychotherapy sessions.  Ambulatory referral to rule out ADHD .  Patient/Guardian was advised Release of Information must be obtained prior to any record release in order to collaborate their care with an outside provider. Patient/Guardian was advised if they have not already done so to contact the registration department to sign all necessary forms in order for us  to release information regarding their care.   Consent: Patient/Guardian gives verbal consent for treatment and assignment of benefits for services provided during this visit. Patient/Guardian expressed  understanding and agreed to proceed.   This note was generated in part or whole with voice recognition software. Voice recognition is usually quite accurate but there are transcription errors that can and very often do occur. I apologize for any typographical errors that were not detected and corrected.    Carolynne Schuchard, MD 05/12/2024, 11:01 AM     [1]  Allergies Allergen Reactions   Buspar [Buspirone] Other (See Comments)    Optical migraines   Divalproex  Sodium Er     Weight gain   Lisinopril Cough   Morphine  Itching and Other (See Comments)    tried to take her face off due to itching   Norvasc  [Amlodipine ] Swelling   Sulfa Antibiotics Other (See Comments)    Passes out

## 2024-05-23 ENCOUNTER — Ambulatory Visit: Attending: Rheumatology | Admitting: Rheumatology

## 2024-05-23 ENCOUNTER — Other Ambulatory Visit: Payer: Self-pay | Admitting: Family

## 2024-05-23 ENCOUNTER — Encounter: Payer: Self-pay | Admitting: Rheumatology

## 2024-05-23 VITALS — BP 111/75 | HR 68 | Temp 97.7°F | Resp 15 | Ht 67.0 in | Wt 237.6 lb

## 2024-05-23 DIAGNOSIS — G8929 Other chronic pain: Secondary | ICD-10-CM

## 2024-05-23 DIAGNOSIS — M25512 Pain in left shoulder: Secondary | ICD-10-CM

## 2024-05-23 DIAGNOSIS — R7689 Other specified abnormal immunological findings in serum: Secondary | ICD-10-CM | POA: Diagnosis not present

## 2024-05-23 DIAGNOSIS — F419 Anxiety disorder, unspecified: Secondary | ICD-10-CM

## 2024-05-23 DIAGNOSIS — F1211 Cannabis abuse, in remission: Secondary | ICD-10-CM

## 2024-05-23 DIAGNOSIS — M19041 Primary osteoarthritis, right hand: Secondary | ICD-10-CM | POA: Diagnosis not present

## 2024-05-23 DIAGNOSIS — Z860100 Personal history of colon polyps, unspecified: Secondary | ICD-10-CM | POA: Diagnosis not present

## 2024-05-23 DIAGNOSIS — R635 Abnormal weight gain: Secondary | ICD-10-CM

## 2024-05-23 DIAGNOSIS — M67449 Ganglion, unspecified hand: Secondary | ICD-10-CM

## 2024-05-23 DIAGNOSIS — K580 Irritable bowel syndrome with diarrhea: Secondary | ICD-10-CM

## 2024-05-23 DIAGNOSIS — M19042 Primary osteoarthritis, left hand: Secondary | ICD-10-CM

## 2024-05-23 DIAGNOSIS — R808 Other proteinuria: Secondary | ICD-10-CM | POA: Diagnosis not present

## 2024-05-23 DIAGNOSIS — I7300 Raynaud's syndrome without gangrene: Secondary | ICD-10-CM

## 2024-05-23 DIAGNOSIS — Z96651 Presence of right artificial knee joint: Secondary | ICD-10-CM

## 2024-05-23 DIAGNOSIS — E785 Hyperlipidemia, unspecified: Secondary | ICD-10-CM | POA: Diagnosis not present

## 2024-05-23 DIAGNOSIS — G479 Sleep disorder, unspecified: Secondary | ICD-10-CM

## 2024-05-23 DIAGNOSIS — M7061 Trochanteric bursitis, right hip: Secondary | ICD-10-CM

## 2024-05-23 DIAGNOSIS — F4310 Post-traumatic stress disorder, unspecified: Secondary | ICD-10-CM

## 2024-05-23 DIAGNOSIS — Z87891 Personal history of nicotine dependence: Secondary | ICD-10-CM

## 2024-05-23 DIAGNOSIS — Z8659 Personal history of other mental and behavioral disorders: Secondary | ICD-10-CM

## 2024-05-23 DIAGNOSIS — Z9889 Other specified postprocedural states: Secondary | ICD-10-CM

## 2024-05-23 DIAGNOSIS — J301 Allergic rhinitis due to pollen: Secondary | ICD-10-CM

## 2024-05-23 DIAGNOSIS — F1011 Alcohol abuse, in remission: Secondary | ICD-10-CM

## 2024-05-23 DIAGNOSIS — E119 Type 2 diabetes mellitus without complications: Secondary | ICD-10-CM

## 2024-05-23 DIAGNOSIS — I10 Essential (primary) hypertension: Secondary | ICD-10-CM | POA: Diagnosis not present

## 2024-05-23 DIAGNOSIS — F32A Depression, unspecified: Secondary | ICD-10-CM

## 2024-05-23 NOTE — Patient Instructions (Signed)

## 2024-08-10 ENCOUNTER — Ambulatory Visit: Admitting: Psychiatry

## 2024-10-02 ENCOUNTER — Encounter (HOSPITAL_COMMUNITY)

## 2024-11-21 ENCOUNTER — Ambulatory Visit: Admitting: Rheumatology

## 2024-12-19 ENCOUNTER — Encounter: Admitting: Dermatology
